# Patient Record
Sex: Female | Born: 1955
Health system: Southern US, Community
[De-identification: ages and names within clinical notes are randomized; demographics above are authoritative.]

## PROBLEM LIST (undated history)

## (undated) DIAGNOSIS — I10 Essential (primary) hypertension: Secondary | ICD-10-CM

## (undated) DIAGNOSIS — E7800 Pure hypercholesterolemia, unspecified: Secondary | ICD-10-CM

## (undated) DIAGNOSIS — F419 Anxiety disorder, unspecified: Secondary | ICD-10-CM

## (undated) DIAGNOSIS — K279 Peptic ulcer, site unspecified, unspecified as acute or chronic, without hemorrhage or perforation: Secondary | ICD-10-CM

## (undated) DIAGNOSIS — I2510 Atherosclerotic heart disease of native coronary artery without angina pectoris: Secondary | ICD-10-CM

## (undated) DIAGNOSIS — I219 Acute myocardial infarction, unspecified: Secondary | ICD-10-CM

## (undated) DIAGNOSIS — M1990 Unspecified osteoarthritis, unspecified site: Secondary | ICD-10-CM

## (undated) DIAGNOSIS — M329 Systemic lupus erythematosus, unspecified: Secondary | ICD-10-CM

## (undated) HISTORY — PX: ESOPHAGOGASTRODUODENOSCOPY: SHX1529

## (undated) HISTORY — PX: BREAST LUMPECTOMY: SHX2

## (undated) HISTORY — PX: TUBAL LIGATION: SHX77

## (undated) HISTORY — PX: ABDOMINAL HYSTERECTOMY: SHX81

## (undated) HISTORY — PX: CORONARY ANGIOPLASTY WITH STENT PLACEMENT: SHX49

---

## 2006-04-22 HISTORY — PX: COLONOSCOPY: SHX174

## 2010-04-22 DIAGNOSIS — I219 Acute myocardial infarction, unspecified: Secondary | ICD-10-CM

## 2010-04-22 HISTORY — DX: Acute myocardial infarction, unspecified: I21.9

## 2011-04-23 DIAGNOSIS — Z7982 Long term (current) use of aspirin: Secondary | ICD-10-CM | POA: Diagnosis not present

## 2011-04-23 DIAGNOSIS — Y92009 Unspecified place in unspecified non-institutional (private) residence as the place of occurrence of the external cause: Secondary | ICD-10-CM | POA: Diagnosis not present

## 2011-04-23 DIAGNOSIS — E785 Hyperlipidemia, unspecified: Secondary | ICD-10-CM | POA: Diagnosis present

## 2011-04-23 DIAGNOSIS — R079 Chest pain, unspecified: Secondary | ICD-10-CM | POA: Diagnosis not present

## 2011-04-23 DIAGNOSIS — IMO0002 Reserved for concepts with insufficient information to code with codable children: Secondary | ICD-10-CM | POA: Diagnosis not present

## 2011-04-23 DIAGNOSIS — Z01818 Encounter for other preprocedural examination: Secondary | ICD-10-CM | POA: Diagnosis not present

## 2011-04-23 DIAGNOSIS — S0990XA Unspecified injury of head, initial encounter: Secondary | ICD-10-CM | POA: Diagnosis not present

## 2011-04-23 DIAGNOSIS — I2510 Atherosclerotic heart disease of native coronary artery without angina pectoris: Secondary | ICD-10-CM | POA: Diagnosis not present

## 2011-04-23 DIAGNOSIS — S7000XA Contusion of unspecified hip, initial encounter: Secondary | ICD-10-CM | POA: Diagnosis not present

## 2011-04-23 DIAGNOSIS — S01502A Unspecified open wound of oral cavity, initial encounter: Secondary | ICD-10-CM | POA: Diagnosis not present

## 2011-04-23 DIAGNOSIS — I059 Rheumatic mitral valve disease, unspecified: Secondary | ICD-10-CM | POA: Diagnosis not present

## 2011-04-23 DIAGNOSIS — W010XXA Fall on same level from slipping, tripping and stumbling without subsequent striking against object, initial encounter: Secondary | ICD-10-CM | POA: Diagnosis not present

## 2011-04-23 DIAGNOSIS — S01501A Unspecified open wound of lip, initial encounter: Secondary | ICD-10-CM | POA: Diagnosis not present

## 2011-04-23 DIAGNOSIS — R072 Precordial pain: Secondary | ICD-10-CM | POA: Diagnosis not present

## 2011-04-23 DIAGNOSIS — Z9861 Coronary angioplasty status: Secondary | ICD-10-CM | POA: Diagnosis not present

## 2011-04-23 DIAGNOSIS — T148XXA Other injury of unspecified body region, initial encounter: Secondary | ICD-10-CM | POA: Diagnosis not present

## 2011-04-23 DIAGNOSIS — S79929A Unspecified injury of unspecified thigh, initial encounter: Secondary | ICD-10-CM | POA: Diagnosis not present

## 2011-04-23 DIAGNOSIS — I10 Essential (primary) hypertension: Secondary | ICD-10-CM | POA: Diagnosis present

## 2011-04-23 DIAGNOSIS — D62 Acute posthemorrhagic anemia: Secondary | ICD-10-CM | POA: Diagnosis not present

## 2011-04-23 DIAGNOSIS — M329 Systemic lupus erythematosus, unspecified: Secondary | ICD-10-CM | POA: Diagnosis present

## 2011-04-23 DIAGNOSIS — I200 Unstable angina: Secondary | ICD-10-CM | POA: Diagnosis present

## 2011-04-23 DIAGNOSIS — Z9181 History of falling: Secondary | ICD-10-CM | POA: Diagnosis not present

## 2011-04-23 DIAGNOSIS — S79919A Unspecified injury of unspecified hip, initial encounter: Secondary | ICD-10-CM | POA: Diagnosis not present

## 2011-05-08 DIAGNOSIS — I2510 Atherosclerotic heart disease of native coronary artery without angina pectoris: Secondary | ICD-10-CM | POA: Diagnosis not present

## 2011-05-08 DIAGNOSIS — E669 Obesity, unspecified: Secondary | ICD-10-CM | POA: Diagnosis not present

## 2011-05-08 DIAGNOSIS — Z9861 Coronary angioplasty status: Secondary | ICD-10-CM | POA: Diagnosis not present

## 2011-05-08 DIAGNOSIS — E7800 Pure hypercholesterolemia, unspecified: Secondary | ICD-10-CM | POA: Diagnosis not present

## 2011-05-27 DIAGNOSIS — R072 Precordial pain: Secondary | ICD-10-CM | POA: Diagnosis not present

## 2011-05-27 DIAGNOSIS — I2510 Atherosclerotic heart disease of native coronary artery without angina pectoris: Secondary | ICD-10-CM | POA: Diagnosis not present

## 2011-05-27 DIAGNOSIS — Z79899 Other long term (current) drug therapy: Secondary | ICD-10-CM | POA: Diagnosis not present

## 2011-05-27 DIAGNOSIS — Z9861 Coronary angioplasty status: Secondary | ICD-10-CM | POA: Diagnosis not present

## 2011-05-30 DIAGNOSIS — I2510 Atherosclerotic heart disease of native coronary artery without angina pectoris: Secondary | ICD-10-CM | POA: Diagnosis present

## 2011-05-30 DIAGNOSIS — R0789 Other chest pain: Secondary | ICD-10-CM | POA: Diagnosis not present

## 2011-05-30 DIAGNOSIS — I252 Old myocardial infarction: Secondary | ICD-10-CM | POA: Diagnosis not present

## 2011-05-30 DIAGNOSIS — Z9861 Coronary angioplasty status: Secondary | ICD-10-CM | POA: Diagnosis not present

## 2011-05-30 DIAGNOSIS — I200 Unstable angina: Secondary | ICD-10-CM | POA: Diagnosis present

## 2011-05-30 DIAGNOSIS — I10 Essential (primary) hypertension: Secondary | ICD-10-CM | POA: Diagnosis present

## 2011-05-30 DIAGNOSIS — D649 Anemia, unspecified: Secondary | ICD-10-CM | POA: Diagnosis present

## 2011-06-13 DIAGNOSIS — I2510 Atherosclerotic heart disease of native coronary artery without angina pectoris: Secondary | ICD-10-CM | POA: Diagnosis not present

## 2011-06-13 DIAGNOSIS — Z9861 Coronary angioplasty status: Secondary | ICD-10-CM | POA: Diagnosis not present

## 2011-06-13 DIAGNOSIS — E7800 Pure hypercholesterolemia, unspecified: Secondary | ICD-10-CM | POA: Diagnosis not present

## 2011-07-29 DIAGNOSIS — I2510 Atherosclerotic heart disease of native coronary artery without angina pectoris: Secondary | ICD-10-CM | POA: Diagnosis not present

## 2011-07-29 DIAGNOSIS — Z9861 Coronary angioplasty status: Secondary | ICD-10-CM | POA: Diagnosis not present

## 2011-07-29 DIAGNOSIS — E7800 Pure hypercholesterolemia, unspecified: Secondary | ICD-10-CM | POA: Diagnosis not present

## 2011-08-20 DIAGNOSIS — E785 Hyperlipidemia, unspecified: Secondary | ICD-10-CM | POA: Diagnosis not present

## 2011-08-20 DIAGNOSIS — I259 Chronic ischemic heart disease, unspecified: Secondary | ICD-10-CM | POA: Diagnosis not present

## 2011-08-20 DIAGNOSIS — K219 Gastro-esophageal reflux disease without esophagitis: Secondary | ICD-10-CM | POA: Diagnosis not present

## 2011-08-20 DIAGNOSIS — R109 Unspecified abdominal pain: Secondary | ICD-10-CM | POA: Diagnosis not present

## 2011-12-11 DIAGNOSIS — I2510 Atherosclerotic heart disease of native coronary artery without angina pectoris: Secondary | ICD-10-CM | POA: Diagnosis not present

## 2011-12-11 DIAGNOSIS — Z9861 Coronary angioplasty status: Secondary | ICD-10-CM | POA: Diagnosis not present

## 2011-12-11 DIAGNOSIS — E7800 Pure hypercholesterolemia, unspecified: Secondary | ICD-10-CM | POA: Diagnosis not present

## 2012-07-08 DIAGNOSIS — E7800 Pure hypercholesterolemia, unspecified: Secondary | ICD-10-CM | POA: Diagnosis not present

## 2012-07-23 DIAGNOSIS — Z9861 Coronary angioplasty status: Secondary | ICD-10-CM | POA: Diagnosis not present

## 2012-07-23 DIAGNOSIS — E7800 Pure hypercholesterolemia, unspecified: Secondary | ICD-10-CM | POA: Diagnosis not present

## 2012-07-23 DIAGNOSIS — I2510 Atherosclerotic heart disease of native coronary artery without angina pectoris: Secondary | ICD-10-CM | POA: Diagnosis not present

## 2013-02-15 DIAGNOSIS — I517 Cardiomegaly: Secondary | ICD-10-CM | POA: Diagnosis not present

## 2013-05-11 ENCOUNTER — Other Ambulatory Visit (HOSPITAL_COMMUNITY): Payer: Self-pay | Admitting: Family Medicine

## 2013-05-11 DIAGNOSIS — I519 Heart disease, unspecified: Secondary | ICD-10-CM | POA: Diagnosis not present

## 2013-05-11 DIAGNOSIS — I10 Essential (primary) hypertension: Secondary | ICD-10-CM | POA: Diagnosis not present

## 2013-05-11 DIAGNOSIS — Z139 Encounter for screening, unspecified: Secondary | ICD-10-CM

## 2013-05-11 DIAGNOSIS — R0602 Shortness of breath: Secondary | ICD-10-CM | POA: Diagnosis not present

## 2013-05-17 ENCOUNTER — Ambulatory Visit (HOSPITAL_COMMUNITY)
Admission: RE | Admit: 2013-05-17 | Discharge: 2013-05-17 | Disposition: A | Discharge status: Home / Self Care | Payer: Medicare Other | Source: Ambulatory Visit | Attending: Family Medicine | Admitting: Family Medicine

## 2013-05-17 DIAGNOSIS — Z1231 Encounter for screening mammogram for malignant neoplasm of breast: Secondary | ICD-10-CM | POA: Diagnosis not present

## 2013-05-17 DIAGNOSIS — Z139 Encounter for screening, unspecified: Secondary | ICD-10-CM

## 2013-05-18 DIAGNOSIS — Z1329 Encounter for screening for other suspected endocrine disorder: Secondary | ICD-10-CM | POA: Diagnosis not present

## 2013-05-18 DIAGNOSIS — Z131 Encounter for screening for diabetes mellitus: Secondary | ICD-10-CM | POA: Diagnosis not present

## 2013-05-18 DIAGNOSIS — Z136 Encounter for screening for cardiovascular disorders: Secondary | ICD-10-CM | POA: Diagnosis not present

## 2013-05-18 DIAGNOSIS — E559 Vitamin D deficiency, unspecified: Secondary | ICD-10-CM | POA: Diagnosis not present

## 2013-06-01 DIAGNOSIS — E7800 Pure hypercholesterolemia, unspecified: Secondary | ICD-10-CM | POA: Diagnosis not present

## 2013-08-16 DIAGNOSIS — I10 Essential (primary) hypertension: Secondary | ICD-10-CM | POA: Diagnosis not present

## 2013-09-10 DIAGNOSIS — I2510 Atherosclerotic heart disease of native coronary artery without angina pectoris: Secondary | ICD-10-CM | POA: Diagnosis not present

## 2013-09-10 DIAGNOSIS — Z79899 Other long term (current) drug therapy: Secondary | ICD-10-CM | POA: Diagnosis not present

## 2013-09-10 DIAGNOSIS — Z9861 Coronary angioplasty status: Secondary | ICD-10-CM | POA: Diagnosis not present

## 2013-09-10 DIAGNOSIS — E7800 Pure hypercholesterolemia, unspecified: Secondary | ICD-10-CM | POA: Diagnosis not present

## 2013-10-21 DIAGNOSIS — L659 Nonscarring hair loss, unspecified: Secondary | ICD-10-CM | POA: Diagnosis not present

## 2013-11-04 DIAGNOSIS — Z4802 Encounter for removal of sutures: Secondary | ICD-10-CM | POA: Diagnosis not present

## 2013-11-04 DIAGNOSIS — L658 Other specified nonscarring hair loss: Secondary | ICD-10-CM | POA: Diagnosis not present

## 2013-11-15 DIAGNOSIS — E7800 Pure hypercholesterolemia, unspecified: Secondary | ICD-10-CM | POA: Diagnosis not present

## 2013-11-15 DIAGNOSIS — I10 Essential (primary) hypertension: Secondary | ICD-10-CM | POA: Diagnosis not present

## 2013-11-15 DIAGNOSIS — I519 Heart disease, unspecified: Secondary | ICD-10-CM | POA: Diagnosis not present

## 2013-12-08 DIAGNOSIS — L089 Local infection of the skin and subcutaneous tissue, unspecified: Secondary | ICD-10-CM | POA: Diagnosis not present

## 2013-12-08 DIAGNOSIS — L439 Lichen planus, unspecified: Secondary | ICD-10-CM | POA: Diagnosis not present

## 2014-02-07 DIAGNOSIS — L089 Local infection of the skin and subcutaneous tissue, unspecified: Secondary | ICD-10-CM | POA: Diagnosis not present

## 2014-02-07 DIAGNOSIS — L669 Cicatricial alopecia, unspecified: Secondary | ICD-10-CM | POA: Diagnosis not present

## 2014-03-20 ENCOUNTER — Observation Stay (HOSPITAL_COMMUNITY)
Admission: EM | Admit: 2014-03-20 | Discharge: 2014-03-21 | Disposition: A | Discharge status: Home / Self Care | Payer: Medicare Other | Attending: Family Medicine | Admitting: Family Medicine

## 2014-03-20 ENCOUNTER — Encounter (HOSPITAL_COMMUNITY): Payer: Self-pay | Admitting: *Deleted

## 2014-03-20 ENCOUNTER — Emergency Department (HOSPITAL_COMMUNITY): Payer: Medicare Other

## 2014-03-20 DIAGNOSIS — Z9861 Coronary angioplasty status: Secondary | ICD-10-CM | POA: Diagnosis not present

## 2014-03-20 DIAGNOSIS — E780 Pure hypercholesterolemia: Secondary | ICD-10-CM | POA: Insufficient documentation

## 2014-03-20 DIAGNOSIS — I2510 Atherosclerotic heart disease of native coronary artery without angina pectoris: Secondary | ICD-10-CM | POA: Diagnosis not present

## 2014-03-20 DIAGNOSIS — M329 Systemic lupus erythematosus, unspecified: Secondary | ICD-10-CM | POA: Diagnosis not present

## 2014-03-20 DIAGNOSIS — R002 Palpitations: Secondary | ICD-10-CM | POA: Diagnosis not present

## 2014-03-20 DIAGNOSIS — R55 Syncope and collapse: Secondary | ICD-10-CM | POA: Insufficient documentation

## 2014-03-20 DIAGNOSIS — R0789 Other chest pain: Principal | ICD-10-CM | POA: Insufficient documentation

## 2014-03-20 DIAGNOSIS — M1990 Unspecified osteoarthritis, unspecified site: Secondary | ICD-10-CM | POA: Insufficient documentation

## 2014-03-20 DIAGNOSIS — R51 Headache: Secondary | ICD-10-CM | POA: Insufficient documentation

## 2014-03-20 DIAGNOSIS — R079 Chest pain, unspecified: Secondary | ICD-10-CM | POA: Diagnosis present

## 2014-03-20 DIAGNOSIS — K279 Peptic ulcer, site unspecified, unspecified as acute or chronic, without hemorrhage or perforation: Secondary | ICD-10-CM | POA: Diagnosis not present

## 2014-03-20 DIAGNOSIS — I10 Essential (primary) hypertension: Secondary | ICD-10-CM | POA: Diagnosis not present

## 2014-03-20 DIAGNOSIS — I252 Old myocardial infarction: Secondary | ICD-10-CM | POA: Insufficient documentation

## 2014-03-20 DIAGNOSIS — F419 Anxiety disorder, unspecified: Secondary | ICD-10-CM | POA: Insufficient documentation

## 2014-03-20 DIAGNOSIS — R519 Headache, unspecified: Secondary | ICD-10-CM

## 2014-03-20 DIAGNOSIS — IMO0002 Reserved for concepts with insufficient information to code with codable children: Secondary | ICD-10-CM

## 2014-03-20 HISTORY — DX: Pure hypercholesterolemia, unspecified: E78.00

## 2014-03-20 HISTORY — DX: Peptic ulcer, site unspecified, unspecified as acute or chronic, without hemorrhage or perforation: K27.9

## 2014-03-20 HISTORY — DX: Anxiety disorder, unspecified: F41.9

## 2014-03-20 HISTORY — DX: Acute myocardial infarction, unspecified: I21.9

## 2014-03-20 HISTORY — DX: Atherosclerotic heart disease of native coronary artery without angina pectoris: I25.10

## 2014-03-20 HISTORY — DX: Essential (primary) hypertension: I10

## 2014-03-20 HISTORY — DX: Systemic lupus erythematosus, unspecified: M32.9

## 2014-03-20 HISTORY — DX: Unspecified osteoarthritis, unspecified site: M19.90

## 2014-03-20 LAB — BASIC METABOLIC PANEL
ANION GAP: 20 — AB (ref 5–15)
BUN: 18 mg/dL (ref 6–23)
CHLORIDE: 101 meq/L (ref 96–112)
CO2: 21 mEq/L (ref 19–32)
Calcium: 10.3 mg/dL (ref 8.4–10.5)
Creatinine, Ser: 0.96 mg/dL (ref 0.50–1.10)
GFR calc Af Amer: 73 mL/min — ABNORMAL LOW (ref >90)
GFR calc non Af Amer: 63 mL/min — ABNORMAL LOW (ref >90)
Glucose, Bld: 110 mg/dL — ABNORMAL HIGH (ref 70–99)
POTASSIUM: 3.1 meq/L — AB (ref 3.7–5.3)
Sodium: 142 mEq/L (ref 137–147)

## 2014-03-20 LAB — CBC WITH DIFFERENTIAL/PLATELET
BAND NEUTROPHILS: 0 % (ref 0–10)
BASOS PCT: 0 % (ref 0–1)
BLASTS: 0 %
Basophils Absolute: 0 10*3/uL (ref 0.0–0.1)
Eosinophils Absolute: 0.2 10*3/uL (ref 0.0–0.7)
Eosinophils Relative: 2 % (ref 0–5)
HCT: 42.2 % (ref 36.0–46.0)
HEMOGLOBIN: 13.9 g/dL (ref 12.0–15.0)
Lymphocytes Relative: 48 % — ABNORMAL HIGH (ref 12–46)
Lymphs Abs: 4.6 10*3/uL — ABNORMAL HIGH (ref 0.7–4.0)
MCH: 30.3 pg (ref 26.0–34.0)
MCHC: 32.9 g/dL (ref 30.0–36.0)
MCV: 92.1 fL (ref 78.0–100.0)
MYELOCYTES: 0 %
Metamyelocytes Relative: 0 %
Monocytes Absolute: 0.6 10*3/uL (ref 0.1–1.0)
Monocytes Relative: 6 % (ref 3–12)
NEUTROS PCT: 44 % (ref 43–77)
Neutro Abs: 4.3 10*3/uL (ref 1.7–7.7)
PROMYELOCYTES ABS: 0 %
Platelets: 397 10*3/uL (ref 150–400)
RBC: 4.58 MIL/uL (ref 3.87–5.11)
RDW: 12.5 % (ref 11.5–15.5)
WBC: 9.7 10*3/uL (ref 4.0–10.5)
nRBC: 0 /100 WBC

## 2014-03-20 LAB — TROPONIN I

## 2014-03-20 LAB — I-STAT TROPONIN, ED: TROPONIN I, POC: 0.02 ng/mL (ref 0.00–0.08)

## 2014-03-20 LAB — D-DIMER, QUANTITATIVE: D-Dimer, Quant: 0.38 ug/mL-FEU (ref 0.00–0.48)

## 2014-03-20 MED ORDER — MORPHINE SULFATE 4 MG/ML IJ SOLN
4.0000 mg | Freq: Once | INTRAMUSCULAR | Status: AC
  Administered 2014-03-20: 4 mg via INTRAVENOUS
  Filled 2014-03-20: qty 1

## 2014-03-20 MED ORDER — SODIUM CHLORIDE 0.9 % IV SOLN: INTRAVENOUS | Status: DC

## 2014-03-20 MED ORDER — SODIUM CHLORIDE 0.9 % IV SOLN
INTRAVENOUS | Status: DC
  Administered 2014-03-21: 02:00:00 via INTRAVENOUS

## 2014-03-20 MED ORDER — NITROGLYCERIN 0.4 MG SL SUBL
0.4000 mg | SUBLINGUAL_TABLET | SUBLINGUAL | Status: DC | PRN
  Administered 2014-03-20 – 2014-03-21 (×2): 0.4 mg via SUBLINGUAL
  Filled 2014-03-20: qty 1

## 2014-03-20 MED ORDER — ONDANSETRON HCL 4 MG/2ML IJ SOLN: 4.0000 mg | Freq: Once | INTRAMUSCULAR | Status: DC

## 2014-03-20 NOTE — ED Notes (Signed)
Pt assisted to bathroom and back to bed 
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
## 2014-03-20 NOTE — ED Notes (Signed)
Pt gone over for CT 
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
## 2014-03-21 ENCOUNTER — Observation Stay (HOSPITAL_COMMUNITY): Payer: Medicare Other

## 2014-03-21 ENCOUNTER — Encounter (HOSPITAL_COMMUNITY): Payer: Self-pay | Admitting: Internal Medicine

## 2014-03-21 ENCOUNTER — Encounter (HOSPITAL_COMMUNITY): Payer: Self-pay

## 2014-03-21 DIAGNOSIS — Z862 Personal history of diseases of the blood and blood-forming organs and certain disorders involving the immune mechanism: Secondary | ICD-10-CM | POA: Diagnosis not present

## 2014-03-21 DIAGNOSIS — IMO0002 Reserved for concepts with insufficient information to code with codable children: Secondary | ICD-10-CM

## 2014-03-21 DIAGNOSIS — R55 Syncope and collapse: Secondary | ICD-10-CM | POA: Insufficient documentation

## 2014-03-21 DIAGNOSIS — R079 Chest pain, unspecified: Secondary | ICD-10-CM | POA: Diagnosis not present

## 2014-03-21 DIAGNOSIS — M329 Systemic lupus erythematosus, unspecified: Secondary | ICD-10-CM

## 2014-03-21 DIAGNOSIS — I10 Essential (primary) hypertension: Secondary | ICD-10-CM | POA: Diagnosis not present

## 2014-03-21 DIAGNOSIS — R072 Precordial pain: Secondary | ICD-10-CM

## 2014-03-21 LAB — GLUCOSE, CAPILLARY
Glucose-Capillary: 102 mg/dL — ABNORMAL HIGH (ref 70–99)
Glucose-Capillary: 111 mg/dL — ABNORMAL HIGH (ref 70–99)

## 2014-03-21 LAB — MAGNESIUM: MAGNESIUM: 2 mg/dL (ref 1.5–2.5)

## 2014-03-21 LAB — LIPID PANEL
CHOL/HDL RATIO: 3.7 ratio
Cholesterol: 227 mg/dL — ABNORMAL HIGH (ref 0–200)
HDL: 62 mg/dL (ref >39)
LDL CALC: 145 mg/dL — AB (ref 0–99)
Triglycerides: 98 mg/dL (ref <150)
VLDL: 20 mg/dL (ref 0–40)

## 2014-03-21 LAB — URINALYSIS, ROUTINE W REFLEX MICROSCOPIC
Bilirubin Urine: NEGATIVE
Glucose, UA: NEGATIVE mg/dL
KETONES UR: NEGATIVE mg/dL
Leukocytes, UA: NEGATIVE
Nitrite: NEGATIVE
PROTEIN: NEGATIVE mg/dL
Specific Gravity, Urine: 1.01 (ref 1.005–1.030)
UROBILINOGEN UA: 0.2 mg/dL (ref 0.0–1.0)
pH: 6 (ref 5.0–8.0)

## 2014-03-21 LAB — CBC WITH DIFFERENTIAL/PLATELET
BASOS ABS: 0 10*3/uL (ref 0.0–0.1)
Basophils Relative: 1 % (ref 0–1)
EOS ABS: 0.1 10*3/uL (ref 0.0–0.7)
EOS PCT: 1 % (ref 0–5)
HCT: 38 % (ref 36.0–46.0)
Hemoglobin: 12.4 g/dL (ref 12.0–15.0)
LYMPHS PCT: 49 % — AB (ref 12–46)
Lymphs Abs: 2.8 10*3/uL (ref 0.7–4.0)
MCH: 30.5 pg (ref 26.0–34.0)
MCHC: 32.6 g/dL (ref 30.0–36.0)
MCV: 93.4 fL (ref 78.0–100.0)
Monocytes Absolute: 0.5 10*3/uL (ref 0.1–1.0)
Monocytes Relative: 10 % (ref 3–12)
Neutro Abs: 2.1 10*3/uL (ref 1.7–7.7)
Neutrophils Relative %: 39 % — ABNORMAL LOW (ref 43–77)
Platelets: 319 10*3/uL (ref 150–400)
RBC: 4.07 MIL/uL (ref 3.87–5.11)
RDW: 12.5 % (ref 11.5–15.5)
WBC: 5.5 10*3/uL (ref 4.0–10.5)

## 2014-03-21 LAB — COMPREHENSIVE METABOLIC PANEL
ALT: 13 U/L (ref 0–35)
AST: 16 U/L (ref 0–37)
Albumin: 3.7 g/dL (ref 3.5–5.2)
Alkaline Phosphatase: 99 U/L (ref 39–117)
Anion gap: 15 (ref 5–15)
BUN: 12 mg/dL (ref 6–23)
CALCIUM: 9.2 mg/dL (ref 8.4–10.5)
CO2: 23 meq/L (ref 19–32)
CREATININE: 0.66 mg/dL (ref 0.50–1.10)
Chloride: 106 mEq/L (ref 96–112)
GFR calc Af Amer: >90 mL/min (ref >90)
GLUCOSE: 100 mg/dL — AB (ref 70–99)
Potassium: 3.7 mEq/L (ref 3.7–5.3)
Sodium: 144 mEq/L (ref 137–147)
Total Bilirubin: 0.6 mg/dL (ref 0.3–1.2)
Total Protein: 6.8 g/dL (ref 6.0–8.3)

## 2014-03-21 LAB — TROPONIN I
Troponin I: <0.3 ng/mL (ref <0.30)
Troponin I: <0.3 ng/mL (ref <0.30)
Troponin I: <0.3 ng/mL (ref <0.30)

## 2014-03-21 LAB — CREATININE, SERUM
CREATININE: 0.68 mg/dL (ref 0.50–1.10)
GFR calc Af Amer: >90 mL/min (ref >90)
GFR calc non Af Amer: >90 mL/min (ref >90)

## 2014-03-21 LAB — CBC

## 2014-03-21 LAB — URINE MICROSCOPIC-ADD ON

## 2014-03-21 MED ORDER — SODIUM CHLORIDE 0.9 % IJ SOLN
INTRAMUSCULAR | Status: AC
  Administered 2014-03-21: 10 mL via INTRAVENOUS
  Filled 2014-03-21: qty 10

## 2014-03-21 MED ORDER — REGADENOSON 0.4 MG/5ML IV SOLN
INTRAVENOUS | Status: AC
  Administered 2014-03-21: 0.4 mg via INTRAVENOUS
  Filled 2014-03-21: qty 5

## 2014-03-21 MED ORDER — METOPROLOL TARTRATE 25 MG PO TABS
25.0000 mg | ORAL_TABLET | Freq: Two times a day (BID) | ORAL | Status: DC
  Administered 2014-03-21 (×2): 25 mg via ORAL
  Filled 2014-03-21 (×2): qty 1

## 2014-03-21 MED ORDER — SODIUM CHLORIDE 0.9 % IJ SOLN
3.0000 mL | Freq: Two times a day (BID) | INTRAMUSCULAR | Status: DC
  Administered 2014-03-21: 3 mL via INTRAVENOUS

## 2014-03-21 MED ORDER — REGADENOSON 0.4 MG/5ML IV SOLN
0.4000 mg | Freq: Once | INTRAVENOUS | Status: AC | PRN
Start: 2014-03-21 — End: 2014-03-21
  Administered 2014-03-21: 0.4 mg via INTRAVENOUS
  Filled 2014-03-21: qty 5

## 2014-03-21 MED ORDER — MORPHINE SULFATE 2 MG/ML IJ SOLN: 1.0000 mg | INTRAMUSCULAR | Status: DC | PRN

## 2014-03-21 MED ORDER — TECHNETIUM TC 99M SESTAMIBI - CARDIOLITE
30.0000 | Freq: Once | INTRAVENOUS | Status: AC | PRN
  Administered 2014-03-21: 30 via INTRAVENOUS

## 2014-03-21 MED ORDER — ONDANSETRON HCL 4 MG PO TABS: 4.0000 mg | ORAL_TABLET | Freq: Four times a day (QID) | ORAL | Status: DC | PRN

## 2014-03-21 MED ORDER — ESCITALOPRAM OXALATE 10 MG PO TABS
10.0000 mg | ORAL_TABLET | Freq: Every day | ORAL | Status: DC
  Administered 2014-03-21: 10 mg via ORAL
  Filled 2014-03-21 (×2): qty 1

## 2014-03-21 MED ORDER — POTASSIUM CHLORIDE CRYS ER 20 MEQ PO TBCR
40.0000 meq | EXTENDED_RELEASE_TABLET | ORAL | Status: AC
  Administered 2014-03-21: 40 meq via ORAL
  Filled 2014-03-21: qty 2

## 2014-03-21 MED ORDER — ENOXAPARIN SODIUM 40 MG/0.4ML ~~LOC~~ SOLN
40.0000 mg | SUBCUTANEOUS | Status: DC
  Administered 2014-03-21: 40 mg via SUBCUTANEOUS
  Filled 2014-03-21: qty 0.4

## 2014-03-21 MED ORDER — HYDRALAZINE HCL 20 MG/ML IJ SOLN: 10.0000 mg | INTRAMUSCULAR | Status: DC | PRN

## 2014-03-21 MED ORDER — ONDANSETRON HCL 4 MG/2ML IJ SOLN: 4.0000 mg | Freq: Four times a day (QID) | INTRAMUSCULAR | Status: DC | PRN

## 2014-03-21 MED ORDER — ACETAMINOPHEN 325 MG PO TABS: 650.0000 mg | ORAL_TABLET | Freq: Four times a day (QID) | ORAL | Status: DC | PRN

## 2014-03-21 MED ORDER — SODIUM CHLORIDE 0.9 % IJ SOLN
10.0000 mL | INTRAMUSCULAR | Status: DC | PRN
  Administered 2014-03-21: 10 mL via INTRAVENOUS
  Filled 2014-03-21: qty 10

## 2014-03-21 MED ORDER — ACETAMINOPHEN 650 MG RE SUPP: 650.0000 mg | Freq: Four times a day (QID) | RECTAL | Status: DC | PRN

## 2014-03-21 MED ORDER — SODIUM CHLORIDE 0.9 % IV SOLN: INTRAVENOUS | Status: DC

## 2014-03-21 MED ORDER — MORPHINE SULFATE 4 MG/ML IJ SOLN
4.0000 mg | Freq: Once | INTRAMUSCULAR | Status: AC
  Administered 2014-03-21: 4 mg via INTRAVENOUS
  Filled 2014-03-21: qty 1

## 2014-03-21 MED ORDER — LISINOPRIL 10 MG PO TABS
10.0000 mg | ORAL_TABLET | Freq: Every day | ORAL | Status: DC
  Administered 2014-03-21 (×2): 10 mg via ORAL
  Filled 2014-03-21 (×2): qty 1

## 2014-03-21 MED ORDER — TECHNETIUM TC 99M SESTAMIBI GENERIC - CARDIOLITE
10.0000 | Freq: Once | INTRAVENOUS | Status: AC | PRN
  Administered 2014-03-21: 10 via INTRAVENOUS

## 2014-03-21 MED ORDER — ASPIRIN EC 325 MG PO TBEC
325.0000 mg | DELAYED_RELEASE_TABLET | Freq: Every day | ORAL | Status: DC
  Administered 2014-03-21: 325 mg via ORAL
  Filled 2014-03-21: qty 1

## 2014-03-21 NOTE — Progress Notes (Signed)
Patient states understanding of discharge instructions.  

## 2014-03-21 NOTE — Progress Notes (Signed)
UR completed 
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
## 2014-03-21 NOTE — Progress Notes (Signed)
Nuclear medicine cardiac stress test is normal per Dr Diona BrownerMcDowell. See report for more details. . Patient is advised to follow up with primary cardiologist. No further recommendations.

## 2014-03-21 NOTE — ED Notes (Signed)
Report given to Jessica RN

## 2014-03-21 NOTE — Progress Notes (Signed)
  Echocardiogram 2D Echocardiogram has been performed.  Leta JunglingCooper, Eddi Hymes M 03/21/2014, 1:43 PM
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
## 2014-03-21 NOTE — Progress Notes (Signed)
Stress Lab Nurses Notes - Sheila [name]  Sheila [name] Sheila [name] 03/21/2014 Reason for doing test: CAD and Chest Pain Type of test: Marlane HatcherLexiscan Cardiolite / Inpatient Rm 324 Nurse performing test: Sheila PoissonPhyllis Billingsly, RN Nuclear Medicine Tech: Sheila [name] Echo Tech: Not Applicable MD performing test: S. McDowell/K.Lyman BishopLawrence NP Family MD: Sheila [name] Test explained and consent signed: Yes.   IV started: Saline lock flushed, No redness or edema and Saline lock from floor Symptoms: nausea Treatment/Intervention: None Reason test stopped: protocol completed After recovery IV was: No redness or edema and Saline Lock flushed Patient to return to Nuc. Med at : 11:45 Patient discharged: Transported back to room 324 via wc Patient's Condition upon discharge was: stable Comments: During test BP 167/99 & HR 84. Recovery BP 148/95 & HR 69.  Symptoms resolved in recovery. Sheila [name], Sheila [name]
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
## 2014-03-22 ENCOUNTER — Encounter (HOSPITAL_COMMUNITY): Payer: Medicare Other

## 2014-03-22 ENCOUNTER — Ambulatory Visit (HOSPITAL_COMMUNITY): Payer: Medicare Other

## 2014-03-22 LAB — GLUCOSE, CAPILLARY: Glucose-Capillary: 126 mg/dL — ABNORMAL HIGH (ref 70–99)

## 2014-05-25 ENCOUNTER — Other Ambulatory Visit (HOSPITAL_COMMUNITY): Payer: Self-pay | Admitting: Family Medicine

## 2014-05-25 DIAGNOSIS — Z1231 Encounter for screening mammogram for malignant neoplasm of breast: Secondary | ICD-10-CM

## 2014-06-08 ENCOUNTER — Ambulatory Visit (HOSPITAL_COMMUNITY)
Admission: RE | Admit: 2014-06-08 | Discharge: 2014-06-08 | Disposition: A | Discharge status: Home / Self Care | Payer: Medicare Other | Source: Ambulatory Visit | Attending: Family Medicine | Admitting: Family Medicine

## 2014-06-08 DIAGNOSIS — Z1231 Encounter for screening mammogram for malignant neoplasm of breast: Secondary | ICD-10-CM | POA: Diagnosis not present

## 2014-06-18 DIAGNOSIS — I519 Heart disease, unspecified: Secondary | ICD-10-CM | POA: Diagnosis not present

## 2014-06-18 DIAGNOSIS — E782 Mixed hyperlipidemia: Secondary | ICD-10-CM | POA: Diagnosis not present

## 2014-06-18 DIAGNOSIS — E780 Pure hypercholesterolemia: Secondary | ICD-10-CM | POA: Diagnosis not present

## 2014-06-18 DIAGNOSIS — F410 Panic disorder [episodic paroxysmal anxiety] without agoraphobia: Secondary | ICD-10-CM | POA: Diagnosis not present

## 2014-06-18 DIAGNOSIS — I10 Essential (primary) hypertension: Secondary | ICD-10-CM | POA: Diagnosis not present

## 2014-06-18 DIAGNOSIS — R739 Hyperglycemia, unspecified: Secondary | ICD-10-CM | POA: Diagnosis not present

## 2014-06-29 DIAGNOSIS — R1013 Epigastric pain: Secondary | ICD-10-CM | POA: Diagnosis not present

## 2014-06-29 DIAGNOSIS — K219 Gastro-esophageal reflux disease without esophagitis: Secondary | ICD-10-CM | POA: Diagnosis not present

## 2014-06-29 DIAGNOSIS — R630 Anorexia: Secondary | ICD-10-CM | POA: Diagnosis not present

## 2014-07-05 DIAGNOSIS — K315 Obstruction of duodenum: Secondary | ICD-10-CM | POA: Diagnosis not present

## 2014-07-05 DIAGNOSIS — K2970 Gastritis, unspecified, without bleeding: Secondary | ICD-10-CM | POA: Diagnosis not present

## 2014-07-05 DIAGNOSIS — R1013 Epigastric pain: Secondary | ICD-10-CM | POA: Diagnosis not present

## 2014-07-05 DIAGNOSIS — K319 Disease of stomach and duodenum, unspecified: Secondary | ICD-10-CM | POA: Diagnosis not present

## 2014-07-05 DIAGNOSIS — K259 Gastric ulcer, unspecified as acute or chronic, without hemorrhage or perforation: Secondary | ICD-10-CM | POA: Diagnosis not present

## 2014-07-05 DIAGNOSIS — K2950 Unspecified chronic gastritis without bleeding: Secondary | ICD-10-CM | POA: Diagnosis not present

## 2014-07-05 DIAGNOSIS — K449 Diaphragmatic hernia without obstruction or gangrene: Secondary | ICD-10-CM | POA: Diagnosis not present

## 2014-07-18 DIAGNOSIS — I519 Heart disease, unspecified: Secondary | ICD-10-CM | POA: Diagnosis not present

## 2014-07-18 DIAGNOSIS — E782 Mixed hyperlipidemia: Secondary | ICD-10-CM | POA: Diagnosis not present

## 2014-07-18 DIAGNOSIS — I10 Essential (primary) hypertension: Secondary | ICD-10-CM | POA: Diagnosis not present

## 2014-07-18 DIAGNOSIS — F410 Panic disorder [episodic paroxysmal anxiety] without agoraphobia: Secondary | ICD-10-CM | POA: Diagnosis not present

## 2014-12-16 DIAGNOSIS — E780 Pure hypercholesterolemia: Secondary | ICD-10-CM | POA: Diagnosis not present

## 2014-12-16 DIAGNOSIS — Z955 Presence of coronary angioplasty implant and graft: Secondary | ICD-10-CM | POA: Diagnosis not present

## 2014-12-16 DIAGNOSIS — I2510 Atherosclerotic heart disease of native coronary artery without angina pectoris: Secondary | ICD-10-CM | POA: Diagnosis not present

## 2015-01-22 ENCOUNTER — Encounter (HOSPITAL_COMMUNITY): Payer: Self-pay | Admitting: Emergency Medicine

## 2015-01-22 ENCOUNTER — Emergency Department (HOSPITAL_COMMUNITY)
Admission: EM | Admit: 2015-01-22 | Discharge: 2015-01-22 | Disposition: A | Discharge status: Home / Self Care | Payer: Medicare Other | Source: Home / Self Care | Attending: Emergency Medicine | Admitting: Emergency Medicine

## 2015-01-22 DIAGNOSIS — R1011 Right upper quadrant pain: Secondary | ICD-10-CM | POA: Diagnosis not present

## 2015-01-22 DIAGNOSIS — M329 Systemic lupus erythematosus, unspecified: Secondary | ICD-10-CM | POA: Diagnosis not present

## 2015-01-22 DIAGNOSIS — K750 Abscess of liver: Secondary | ICD-10-CM | POA: Diagnosis not present

## 2015-01-22 DIAGNOSIS — N390 Urinary tract infection, site not specified: Secondary | ICD-10-CM

## 2015-01-22 DIAGNOSIS — N179 Acute kidney failure, unspecified: Secondary | ICD-10-CM | POA: Diagnosis not present

## 2015-01-22 DIAGNOSIS — R197 Diarrhea, unspecified: Secondary | ICD-10-CM | POA: Diagnosis not present

## 2015-01-22 DIAGNOSIS — I96 Gangrene, not elsewhere classified: Secondary | ICD-10-CM | POA: Diagnosis not present

## 2015-01-22 DIAGNOSIS — R112 Nausea with vomiting, unspecified: Secondary | ICD-10-CM | POA: Diagnosis not present

## 2015-01-22 DIAGNOSIS — K819 Cholecystitis, unspecified: Secondary | ICD-10-CM | POA: Diagnosis not present

## 2015-01-22 DIAGNOSIS — R1084 Generalized abdominal pain: Secondary | ICD-10-CM | POA: Diagnosis not present

## 2015-01-22 DIAGNOSIS — E860 Dehydration: Secondary | ICD-10-CM | POA: Diagnosis not present

## 2015-01-22 DIAGNOSIS — R1010 Upper abdominal pain, unspecified: Secondary | ICD-10-CM | POA: Diagnosis not present

## 2015-01-22 DIAGNOSIS — K8020 Calculus of gallbladder without cholecystitis without obstruction: Secondary | ICD-10-CM | POA: Diagnosis not present

## 2015-01-22 DIAGNOSIS — K8000 Calculus of gallbladder with acute cholecystitis without obstruction: Secondary | ICD-10-CM | POA: Diagnosis not present

## 2015-01-22 DIAGNOSIS — R509 Fever, unspecified: Secondary | ICD-10-CM | POA: Diagnosis not present

## 2015-01-22 DIAGNOSIS — R109 Unspecified abdominal pain: Secondary | ICD-10-CM | POA: Diagnosis not present

## 2015-01-22 DIAGNOSIS — R1013 Epigastric pain: Secondary | ICD-10-CM | POA: Diagnosis not present

## 2015-01-22 LAB — CBC
HCT: 39 % (ref 36.0–46.0)
Hemoglobin: 12.3 g/dL (ref 12.0–15.0)
MCH: 30.1 pg (ref 26.0–34.0)
MCHC: 31.5 g/dL (ref 30.0–36.0)
MCV: 95.6 fL (ref 78.0–100.0)
PLATELETS: 338 10*3/uL (ref 150–400)
RBC: 4.08 MIL/uL (ref 3.87–5.11)
RDW: 12.9 % (ref 11.5–15.5)
WBC: 9.1 10*3/uL (ref 4.0–10.5)

## 2015-01-22 LAB — COMPREHENSIVE METABOLIC PANEL
ALK PHOS: 121 U/L (ref 38–126)
ALT: 18 U/L (ref 14–54)
AST: 24 U/L (ref 15–41)
Albumin: 4.2 g/dL (ref 3.5–5.0)
Anion gap: 8 (ref 5–15)
BUN: 14 mg/dL (ref 6–20)
CO2: 28 mmol/L (ref 22–32)
CREATININE: 0.8 mg/dL (ref 0.44–1.00)
Calcium: 9 mg/dL (ref 8.9–10.3)
Chloride: 105 mmol/L (ref 101–111)
GFR calc non Af Amer: >60 mL/min (ref >60)
GLUCOSE: 115 mg/dL — AB (ref 65–99)
Potassium: 4.1 mmol/L (ref 3.5–5.1)
SODIUM: 141 mmol/L (ref 135–145)
Total Bilirubin: 0.8 mg/dL (ref 0.3–1.2)
Total Protein: 7.6 g/dL (ref 6.5–8.1)

## 2015-01-22 LAB — URINALYSIS, ROUTINE W REFLEX MICROSCOPIC
Bilirubin Urine: NEGATIVE
GLUCOSE, UA: NEGATIVE mg/dL
KETONES UR: NEGATIVE mg/dL
Nitrite: NEGATIVE
Protein, ur: NEGATIVE mg/dL
Specific Gravity, Urine: <1.005 — ABNORMAL LOW (ref 1.005–1.030)
Urobilinogen, UA: 0.2 mg/dL (ref 0.0–1.0)
pH: 6 (ref 5.0–8.0)

## 2015-01-22 LAB — URINE MICROSCOPIC-ADD ON

## 2015-01-22 LAB — LIPASE, BLOOD: Lipase: 28 U/L (ref 22–51)

## 2015-01-22 MED ORDER — OXYCODONE-ACETAMINOPHEN 5-325 MG PO TABS: 1.0000 | ORAL_TABLET | ORAL | Status: DC | PRN

## 2015-01-22 MED ORDER — ONDANSETRON HCL 8 MG PO TABS: 8.0000 mg | ORAL_TABLET | ORAL | Status: DC | PRN

## 2015-01-22 MED ORDER — CEPHALEXIN 500 MG PO CAPS: 500.0000 mg | ORAL_CAPSULE | Freq: Four times a day (QID) | ORAL | Status: DC

## 2015-01-22 NOTE — ED Notes (Addendum)
Pt c/o sharp upper abdominal pain and nausea that started Friday. Pt reports she has been unable to eat much over the past several days because when she does eat the abdominal pain gets worse. Pt denies any urinary symptoms. Pt does report 1 episode of diarrhea on Friday and she took some Milk of Magnesia afterwards to see if it would help her abdominal pain, but she had no relief. Denies fever and chills. Pt reports she had an EGD done in March 2016 in [place] and was told she had a gastric ulcer and severe reflux. Reports she was put on Carafate to help heal the ulcer but she doesn't feel like it has been helping.

## 2015-01-22 NOTE — ED Provider Notes (Signed)
CSN: [id number]     Arrival date & time 01/22/15  1153 History  By signing my name below, I, Sheila [name], attest that this documentation has been prepared under the direction and in the presence of Donnetta Hutching, MD. Electronically Signed: Murriel [name], ED Scribe. 01/22/2015. 12:40 PM.    Chief Complaint  Patient presents with  . Abdominal Pain     The history is provided by the patient. No language interpreter was used.   HPI Comments: Sheila [name] is a 59 y.o. female who presents to the Emergency Department complaining of intermittent central upper abdominal pain that radiates to the RUQ with associated nausea that has been present for two days. Pt states she has had acid reflux for over 25 years and reports her symptoms could be related to her reflux problems. Pt states that she received an endoscopy in March and was told that she had ulcers in her stomach. Pt reports her appetite has been poor, and states she is only able to eat soup. Pt reports she has had similar symptoms before but states they were worse this week. Pt reports one episode of diarrhea two days ago, and took some Milk of Magnesia afterwards to help with her abdominal pain, but had no relief. Pt denies fever and chills.   Past Medical History  Diagnosis Date  . Myocardial infarction (HCC) 2012   . CVD (cardiovascular disease)     Stent to mid RCA 2012 [name] 2.5 X 12 mm  . Systemic lupus (HCC)   . Anxiety   . Hypertension   . Arthritis   . Hypercholesterolemia   . PUD (peptic ulcer disease)    Past Surgical History  Procedure Laterality Date  . Coronary angioplasty with stent placement  2012, 2013.    [name] DES to mid RCA. Stents X 2 to unknown artery  . Abdominal hysterectomy    . Tubal ligation    . Breast lumpectomy     Family History  Problem Relation Age of Onset  . CAD Father     CABG  . Heart attack Brother     PTCA  . Hypertension Sister   . Hypertension Father    Social History  Substance  Use Topics  . Smoking status: Never Smoker   . Smokeless tobacco: None  . Alcohol Use: No   OB History    No data available     Review of Systems  A complete 10 system review of systems was obtained and all systems are negative except as noted in the HPI and PMH.    Allergies  Review of patient's allergies indicates no known allergies.  Home Medications   Prior to Admission medications   Medication Sig Start Date End Date Taking? Authorizing Provider  diphenhydramine-acetaminophen (TYLENOL PM) 25-500 MG TABS tablet Take 1 tablet by mouth at bedtime as needed.   Yes Historical Provider, MD  docusate sodium (COLACE) 100 MG capsule Take 100 mg by mouth 2 (two) times daily.   Yes Historical Provider, MD  escitalopram (LEXAPRO) 10 MG tablet Take 10 mg by mouth daily.   Yes Historical Provider, MD  metoprolol (LOPRESSOR) 50 MG tablet Take 50 mg by mouth 3 (three) times daily.   Yes Historical Provider, MD  Multiple Vitamins-Calcium (ONE-A-DAY WOMENS FORMULA PO) Take 1 tablet by mouth daily.   Yes Historical Provider, MD  Multiple Vitamins-Minerals (HAIR/SKIN/NAILS PO) Take 1 tablet by mouth daily.   Yes Historical Provider, MD  omeprazole (PRILOSEC) 20 MG capsule Take  20 mg by mouth daily.   Yes Historical Provider, MD  cephALEXin (KEFLEX) 500 MG capsule Take 1 capsule (500 mg total) by mouth 4 (four) times daily. 01/22/15   Donnetta Hutching, MD  ondansetron (ZOFRAN) 8 MG tablet Take 1 tablet (8 mg total) by mouth every 4 (four) hours as needed. 01/22/15   Donnetta Hutching, MD  ondansetron (ZOFRAN) 8 MG tablet Take 1 tablet (8 mg total) by mouth every 4 (four) hours as needed. 01/22/15   Donnetta Hutching, MD  oxyCODONE-acetaminophen (PERCOCET/ROXICET) 5-325 MG tablet Take 1-2 tablets by mouth every 4 (four) hours as needed for severe pain. 01/22/15   Donnetta Hutching, MD   BP 114/102 mmHg  Pulse 64  Temp(Src) 98.3 F (36.8 C) (Oral)  Resp 15  Ht  (1.549 m)  Wt 145 lb (65.772 kg)  BMI 27.41 kg/m2  SpO2  98% Physical Exam  Constitutional: She is oriented to person, place, and time. She appears well-developed and well-nourished.  HENT:  Head: Normocephalic and atraumatic.  Eyes: Conjunctivae and EOM are normal. Pupils are equal, round, and reactive to light.  Neck: Normal range of motion. Neck supple.  Cardiovascular: Normal rate and regular rhythm.   Pulmonary/Chest: Effort normal and breath sounds normal.  Abdominal: Soft. Bowel sounds are normal. There is tenderness.  Minimal epigastric and RUQ tenderness.  Musculoskeletal: Normal range of motion.  Neurological: She is alert and oriented to person, place, and time.  Skin: Skin is warm and dry.  Psychiatric: She has a normal mood and affect. Her behavior is normal.  Nursing note and vitals reviewed.   ED Course  Procedures (including critical care time)  DIAGNOSTIC STUDIES: Oxygen Saturation is 99% on room air, normal by my interpretation.    COORDINATION OF CARE: 12:32 PM Discussed treatment plan with pt at bedside and pt agreed to plan.Pt will receive basic bloodwork, ultrasound of her gallbladder, and pain management.    Labs Review Labs Reviewed  COMPREHENSIVE METABOLIC PANEL - Abnormal; Notable for the following:    Glucose, Bld 115 (*)    All other components within normal limits  URINALYSIS, ROUTINE W REFLEX MICROSCOPIC (NOT AT [facility]) - Abnormal; Notable for the following:    Specific Gravity, Urine <1.005 (*)    Hgb urine dipstick MODERATE (*)    Leukocytes, UA MODERATE (*)    All other components within normal limits  URINE MICROSCOPIC-ADD ON - Abnormal; Notable for the following:    Squamous Epithelial / LPF FEW (*)    Bacteria, UA FEW (*)    All other components within normal limits  URINE CULTURE  LIPASE, BLOOD  CBC    Imaging Review No results found. I have personally reviewed and evaluated these images and lab results as part of my medical decision-making.   EKG Interpretation None      MDM    Final diagnoses:  RUQ abdominal pain  UTI (lower urinary tract infection)    No acute abdomen. Screening labs show no elevation of hepatic enzymes. White count normal. Minor evidence of a urinary tract infection. Discharge medications Keflex, Percocet, Zofran 8 mg. Follow-up gallbladder ultrasound on Wednesday, October 5.  I personally performed the services described in this documentation, which was scribed in my presence. The recorded information has been reviewed and is accurate.    Donnetta Hutching, MD 01/22/15 [phone]

## 2015-01-22 NOTE — Discharge Instructions (Signed)
Medication for pain, nausea, antibiotic for urinary tract infection. Ultrasound of gallbladder will be scheduled for Wednesday. Follow-up your primary care doctor.

## 2015-01-22 NOTE — ED Notes (Signed)
Having abdominal pain since Friday with nausea.  Rates pain 10/10.  Had diarrhea on Friday.  History of GI issues.

## 2015-01-25 ENCOUNTER — Emergency Department (HOSPITAL_COMMUNITY): Payer: Medicare Other

## 2015-01-25 ENCOUNTER — Inpatient Hospital Stay (HOSPITAL_COMMUNITY)
Admission: EM | Admit: 2015-01-25 | Discharge: 2015-01-30 | DRG: 414 | Disposition: A | Discharge status: Home / Self Care | Payer: Medicare Other | Attending: General Surgery | Admitting: General Surgery

## 2015-01-25 ENCOUNTER — Ambulatory Visit (HOSPITAL_COMMUNITY): Admit: 2015-01-25 | Payer: Medicare Other

## 2015-01-25 ENCOUNTER — Encounter (HOSPITAL_COMMUNITY): Payer: Self-pay | Admitting: Emergency Medicine

## 2015-01-25 ENCOUNTER — Inpatient Hospital Stay (HOSPITAL_COMMUNITY): Payer: Medicare Other

## 2015-01-25 DIAGNOSIS — N179 Acute kidney failure, unspecified: Secondary | ICD-10-CM | POA: Diagnosis not present

## 2015-01-25 DIAGNOSIS — R1084 Generalized abdominal pain: Secondary | ICD-10-CM | POA: Diagnosis not present

## 2015-01-25 DIAGNOSIS — E7800 Pure hypercholesterolemia, unspecified: Secondary | ICD-10-CM | POA: Diagnosis present

## 2015-01-25 DIAGNOSIS — E876 Hypokalemia: Secondary | ICD-10-CM | POA: Diagnosis not present

## 2015-01-25 DIAGNOSIS — K819 Cholecystitis, unspecified: Secondary | ICD-10-CM

## 2015-01-25 DIAGNOSIS — Z23 Encounter for immunization: Secondary | ICD-10-CM | POA: Diagnosis not present

## 2015-01-25 DIAGNOSIS — Z01818 Encounter for other preprocedural examination: Secondary | ICD-10-CM

## 2015-01-25 DIAGNOSIS — M1990 Unspecified osteoarthritis, unspecified site: Secondary | ICD-10-CM | POA: Diagnosis present

## 2015-01-25 DIAGNOSIS — I10 Essential (primary) hypertension: Secondary | ICD-10-CM | POA: Diagnosis present

## 2015-01-25 DIAGNOSIS — I2510 Atherosclerotic heart disease of native coronary artery without angina pectoris: Secondary | ICD-10-CM | POA: Diagnosis present

## 2015-01-25 DIAGNOSIS — R509 Fever, unspecified: Secondary | ICD-10-CM | POA: Diagnosis not present

## 2015-01-25 DIAGNOSIS — M329 Systemic lupus erythematosus, unspecified: Secondary | ICD-10-CM | POA: Diagnosis present

## 2015-01-25 DIAGNOSIS — K750 Abscess of liver: Secondary | ICD-10-CM | POA: Diagnosis not present

## 2015-01-25 DIAGNOSIS — K8000 Calculus of gallbladder with acute cholecystitis without obstruction: Secondary | ICD-10-CM | POA: Diagnosis not present

## 2015-01-25 DIAGNOSIS — I252 Old myocardial infarction: Secondary | ICD-10-CM

## 2015-01-25 DIAGNOSIS — Z8249 Family history of ischemic heart disease and other diseases of the circulatory system: Secondary | ICD-10-CM

## 2015-01-25 DIAGNOSIS — E860 Dehydration: Secondary | ICD-10-CM | POA: Diagnosis not present

## 2015-01-25 DIAGNOSIS — R109 Unspecified abdominal pain: Secondary | ICD-10-CM | POA: Diagnosis not present

## 2015-01-25 DIAGNOSIS — R1010 Upper abdominal pain, unspecified: Secondary | ICD-10-CM | POA: Diagnosis present

## 2015-01-25 DIAGNOSIS — I96 Gangrene, not elsewhere classified: Secondary | ICD-10-CM | POA: Diagnosis present

## 2015-01-25 DIAGNOSIS — R197 Diarrhea, unspecified: Secondary | ICD-10-CM | POA: Diagnosis not present

## 2015-01-25 DIAGNOSIS — R112 Nausea with vomiting, unspecified: Secondary | ICD-10-CM

## 2015-01-25 DIAGNOSIS — K810 Acute cholecystitis: Secondary | ICD-10-CM | POA: Diagnosis not present

## 2015-01-25 DIAGNOSIS — K8020 Calculus of gallbladder without cholecystitis without obstruction: Secondary | ICD-10-CM | POA: Diagnosis not present

## 2015-01-25 DIAGNOSIS — Z955 Presence of coronary angioplasty implant and graft: Secondary | ICD-10-CM

## 2015-01-25 DIAGNOSIS — IMO0002 Reserved for concepts with insufficient information to code with codable children: Secondary | ICD-10-CM

## 2015-01-25 DIAGNOSIS — E785 Hyperlipidemia, unspecified: Secondary | ICD-10-CM | POA: Diagnosis present

## 2015-01-25 LAB — CBC WITH DIFFERENTIAL/PLATELET
BASOS ABS: 0 10*3/uL (ref 0.0–0.1)
BASOS PCT: 0 %
Eosinophils Absolute: 0 10*3/uL (ref 0.0–0.7)
Eosinophils Relative: 0 %
HEMATOCRIT: 36.8 % (ref 36.0–46.0)
HEMOGLOBIN: 12.3 g/dL (ref 12.0–15.0)
LYMPHS PCT: 9 %
Lymphs Abs: 1.4 10*3/uL (ref 0.7–4.0)
MCH: 31.2 pg (ref 26.0–34.0)
MCHC: 33.4 g/dL (ref 30.0–36.0)
MCV: 93.4 fL (ref 78.0–100.0)
Monocytes Absolute: 1.4 10*3/uL — ABNORMAL HIGH (ref 0.1–1.0)
Monocytes Relative: 9 %
NEUTROS ABS: 13.7 10*3/uL — AB (ref 1.7–7.7)
NEUTROS PCT: 82 %
Platelets: 357 10*3/uL (ref 150–400)
RBC: 3.94 MIL/uL (ref 3.87–5.11)
RDW: 12.8 % (ref 11.5–15.5)
WBC: 16.5 10*3/uL — AB (ref 4.0–10.5)

## 2015-01-25 LAB — COMPREHENSIVE METABOLIC PANEL
ALT: 64 U/L — ABNORMAL HIGH (ref 14–54)
ANION GAP: 15 (ref 5–15)
AST: 86 U/L — ABNORMAL HIGH (ref 15–41)
Albumin: 4.2 g/dL (ref 3.5–5.0)
Alkaline Phosphatase: 111 U/L (ref 38–126)
BILIRUBIN TOTAL: 0.9 mg/dL (ref 0.3–1.2)
BUN: 21 mg/dL — ABNORMAL HIGH (ref 6–20)
CHLORIDE: 98 mmol/L — AB (ref 101–111)
CO2: 21 mmol/L — ABNORMAL LOW (ref 22–32)
Calcium: 9.1 mg/dL (ref 8.9–10.3)
Creatinine, Ser: 1.23 mg/dL — ABNORMAL HIGH (ref 0.44–1.00)
GFR calc Af Amer: 54 mL/min — ABNORMAL LOW (ref >60)
GFR, EST NON AFRICAN AMERICAN: 46 mL/min — AB (ref >60)
Glucose, Bld: 145 mg/dL — ABNORMAL HIGH (ref 65–99)
POTASSIUM: 3.6 mmol/L (ref 3.5–5.1)
Sodium: 134 mmol/L — ABNORMAL LOW (ref 135–145)
TOTAL PROTEIN: 8.4 g/dL — AB (ref 6.5–8.1)

## 2015-01-25 LAB — URINALYSIS, ROUTINE W REFLEX MICROSCOPIC
Bilirubin Urine: NEGATIVE
GLUCOSE, UA: NEGATIVE mg/dL
KETONES UR: NEGATIVE mg/dL
Nitrite: NEGATIVE
PH: 6 (ref 5.0–8.0)
Specific Gravity, Urine: 1.025 (ref 1.005–1.030)
Urobilinogen, UA: 0.2 mg/dL (ref 0.0–1.0)

## 2015-01-25 LAB — URINE MICROSCOPIC-ADD ON

## 2015-01-25 LAB — LIPASE, BLOOD: LIPASE: 18 U/L — AB (ref 22–51)

## 2015-01-25 MED ORDER — ONDANSETRON HCL 4 MG/2ML IJ SOLN
4.0000 mg | Freq: Four times a day (QID) | INTRAMUSCULAR | Status: DC | PRN
  Administered 2015-01-29: 4 mg via INTRAVENOUS
  Filled 2015-01-25: qty 2

## 2015-01-25 MED ORDER — SODIUM CHLORIDE 0.9 % IV SOLN
INTRAVENOUS | Status: DC
Start: 2015-01-25 — End: 2015-01-27
  Administered 2015-01-25 – 2015-01-26 (×3): via INTRAVENOUS

## 2015-01-25 MED ORDER — PIPERACILLIN-TAZOBACTAM 3.375 G IVPB 30 MIN
3.3750 g | Freq: Once | INTRAVENOUS | Status: AC
  Administered 2015-01-25: 3.375 g via INTRAVENOUS
  Filled 2015-01-25: qty 50

## 2015-01-25 MED ORDER — MORPHINE SULFATE (PF) 4 MG/ML IV SOLN
4.0000 mg | Freq: Once | INTRAVENOUS | Status: AC
  Administered 2015-01-25: 4 mg via INTRAVENOUS
  Filled 2015-01-25: qty 1

## 2015-01-25 MED ORDER — PANTOPRAZOLE SODIUM 40 MG IV SOLR
40.0000 mg | INTRAVENOUS | Status: DC
  Administered 2015-01-25 – 2015-01-28 (×3): 40 mg via INTRAVENOUS
  Filled 2015-01-25 (×3): qty 40

## 2015-01-25 MED ORDER — ENOXAPARIN SODIUM 40 MG/0.4ML ~~LOC~~ SOLN
40.0000 mg | SUBCUTANEOUS | Status: DC
  Administered 2015-01-25 – 2015-01-26 (×2): 40 mg via SUBCUTANEOUS
  Filled 2015-01-25 (×2): qty 0.4

## 2015-01-25 MED ORDER — ONDANSETRON HCL 4 MG/2ML IJ SOLN
4.0000 mg | Freq: Once | INTRAMUSCULAR | Status: AC
Start: 2015-01-25 — End: 2015-01-25
  Administered 2015-01-25: 4 mg via INTRAVENOUS
  Filled 2015-01-25: qty 2

## 2015-01-25 MED ORDER — IOHEXOL 300 MG/ML  SOLN
100.0000 mL | Freq: Once | INTRAMUSCULAR | Status: AC | PRN
  Administered 2015-01-25: 100 mL via INTRAVENOUS

## 2015-01-25 MED ORDER — ACETAMINOPHEN 325 MG PO TABS: 650.0000 mg | ORAL_TABLET | Freq: Four times a day (QID) | ORAL | Status: DC | PRN

## 2015-01-25 MED ORDER — ACETAMINOPHEN 650 MG RE SUPP: 650.0000 mg | Freq: Four times a day (QID) | RECTAL | Status: DC | PRN

## 2015-01-25 MED ORDER — SODIUM CHLORIDE 0.9 % IV BOLUS (SEPSIS)
1000.0000 mL | Freq: Once | INTRAVENOUS | Status: AC
  Administered 2015-01-25: 1000 mL via INTRAVENOUS

## 2015-01-25 MED ORDER — OXYCODONE HCL 5 MG PO TABS
5.0000 mg | ORAL_TABLET | ORAL | Status: DC | PRN
  Administered 2015-01-25 (×2): 10 mg via ORAL
  Administered 2015-01-25: 5 mg via ORAL
  Administered 2015-01-26 – 2015-01-28 (×4): 10 mg via ORAL
  Administered 2015-01-29: 5 mg via ORAL
  Administered 2015-01-29 (×2): 10 mg via ORAL
  Administered 2015-01-30: 5 mg via ORAL
  Administered 2015-01-30: 10 mg via ORAL
  Filled 2015-01-25: qty 1
  Filled 2015-01-25 (×6): qty 2
  Filled 2015-01-25: qty 1
  Filled 2015-01-25 (×3): qty 2
  Filled 2015-01-25: qty 1

## 2015-01-25 MED ORDER — HYDROMORPHONE HCL 1 MG/ML IJ SOLN
1.0000 mg | INTRAMUSCULAR | Status: DC | PRN
  Administered 2015-01-25 – 2015-01-26 (×5): 1 mg via INTRAVENOUS
  Filled 2015-01-25 (×5): qty 1

## 2015-01-25 MED ORDER — IOHEXOL 300 MG/ML  SOLN
50.0000 mL | Freq: Once | INTRAMUSCULAR | Status: AC | PRN
  Administered 2015-01-25: 50 mL via ORAL

## 2015-01-25 MED ORDER — ONDANSETRON HCL 4 MG PO TABS: 4.0000 mg | ORAL_TABLET | Freq: Four times a day (QID) | ORAL | Status: DC | PRN

## 2015-01-25 MED ORDER — HYDROMORPHONE HCL 1 MG/ML IJ SOLN
1.0000 mg | Freq: Once | INTRAMUSCULAR | Status: AC
  Administered 2015-01-25: 1 mg via INTRAVENOUS
  Filled 2015-01-25: qty 1

## 2015-01-25 MED ORDER — PIPERACILLIN-TAZOBACTAM 3.375 G IVPB
3.3750 g | Freq: Three times a day (TID) | INTRAVENOUS | Status: DC
  Administered 2015-01-25 – 2015-01-29 (×13): 3.375 g via INTRAVENOUS
  Filled 2015-01-25 (×18): qty 50

## 2015-01-25 MED ORDER — METOPROLOL TARTRATE 50 MG PO TABS
50.0000 mg | ORAL_TABLET | Freq: Three times a day (TID) | ORAL | Status: DC
  Administered 2015-01-25 – 2015-01-30 (×15): 50 mg via ORAL
  Filled 2015-01-25 (×15): qty 1

## 2015-01-25 MED ORDER — PREGABALIN 75 MG PO CAPS
75.0000 mg | ORAL_CAPSULE | Freq: Every day | ORAL | Status: DC
  Administered 2015-01-25 – 2015-01-30 (×5): 75 mg via ORAL
  Filled 2015-01-25 (×5): qty 1

## 2015-01-25 NOTE — ED Provider Notes (Signed)
CSN: [id number]     Arrival date & time 01/25/15  0525 History   First MD Initiated Contact with Patient 01/25/15 [id number]     Chief Complaint  Patient presents with  . Abdominal Pain     (Consider location/radiation/quality/duration/timing/severity/associated sxs/prior Treatment) HPI patient reports she is been having upper abdominal pain constantly that started September 29. She states the pain is all the way across her upper abdomen. The pain has been constant but getting worse until the last 2 days it has gotten excruciating. She describes the pain as pressure, dull, and aching. Every once and also get a sharp jabbing pain. She has had nausea and vomiting daily about 3-4 times a day that she describes as bile. She reports yesterday she had chills and had fever to 100.1. She denies any diarrhea. She states she has lost appetite. However before that she states eating any type of food made the pain worse. She denies being on any type of PPI or acid reducer. She states Advil helps her pain. She states she took one Lyrica to see if that would help her pain. She was seen in the ED on October 2 and was given Percocet which she's been taking without relief. She is scheduled to have an outpatient ultrasound done at 8:00 this morning. She states she had ultrasound done about 5 years ago and she did not have gallstones at that time. She reports she has a gastroenterologist in [place]. She was last seen in March. She states she was having similar symptoms then and she was diagnosed with a hiatal hernia and ulcer. She denies been on steroids or any other type of medications for her lupus.  PCP Dr Lowella Bandy  Past Medical History  Diagnosis Date  . Myocardial infarction (HCC) 2012   . CVD (cardiovascular disease)     Stent to mid RCA 2012 [name] 2.5 X 12 mm  . Systemic lupus (HCC)   . Anxiety   . Hypertension   . Arthritis   . Hypercholesterolemia   . PUD (peptic ulcer disease)    Past Surgical History    Procedure Laterality Date  . Coronary angioplasty with stent placement  2012, 2013.    [name] DES to mid RCA. Stents X 2 to unknown artery  . Abdominal hysterectomy    . Tubal ligation    . Breast lumpectomy     Family History  Problem Relation Age of Onset  . CAD Father     CABG  . Heart attack Brother     PTCA  . Hypertension Sister   . Hypertension Father    Social History  Substance Use Topics  . Smoking status: Never Smoker   . Smokeless tobacco: None  . Alcohol Use: No   Disability for lupus  OB History    No data available     Review of Systems  All other systems reviewed and are negative.     Allergies  Review of patient's allergies indicates no known allergies.  Home Medications   Prior to Admission medications   Medication Sig Start Date End Date Taking? Authorizing Provider  cephALEXin (KEFLEX) 500 MG capsule Take 1 capsule (500 mg total) by mouth 4 (four) times daily. 01/22/15   Donnetta Hutching, MD  diphenhydramine-acetaminophen (TYLENOL PM) 25-500 MG TABS tablet Take 1 tablet by mouth at bedtime as needed.    Historical Provider, MD  docusate sodium (COLACE) 100 MG capsule Take 100 mg by mouth 2 (two) times daily.  Historical Provider, MD  escitalopram (LEXAPRO) 10 MG tablet Take 10 mg by mouth daily.    Historical Provider, MD  metoprolol (LOPRESSOR) 50 MG tablet Take 50 mg by mouth 3 (three) times daily.    Historical Provider, MD  Multiple Vitamins-Calcium (ONE-A-DAY WOMENS FORMULA PO) Take 1 tablet by mouth daily.    Historical Provider, MD  Multiple Vitamins-Minerals (HAIR/SKIN/NAILS PO) Take 1 tablet by mouth daily.    Historical Provider, MD  omeprazole (PRILOSEC) 20 MG capsule Take 20 mg by mouth daily.    Historical Provider, MD  ondansetron (ZOFRAN) 8 MG tablet Take 1 tablet (8 mg total) by mouth every 4 (four) hours as needed. 01/22/15   Donnetta Hutching, MD  ondansetron (ZOFRAN) 8 MG tablet Take 1 tablet (8 mg total) by mouth every 4 (four) hours as  needed. 01/22/15   Donnetta Hutching, MD  oxyCODONE-acetaminophen (PERCOCET/ROXICET) 5-325 MG tablet Take 1-2 tablets by mouth every 4 (four) hours as needed for severe pain. 01/22/15   Donnetta Hutching, MD   BP 145/87 mmHg  Pulse 98  Temp(Src) 98.1 F (36.7 C) (Oral)  Resp 20  Ht  (1.549 m)  Wt 145 lb (65.772 kg)  BMI 27.41 kg/m2  SpO2 100%  Vital signs normal    Physical Exam  Constitutional: She is oriented to person, place, and time. She appears well-developed and well-nourished.  Non-toxic appearance. She does not appear ill. No distress.  HENT:  Head: Normocephalic and atraumatic.  Right Ear: External ear normal.  Left Ear: External ear normal.  Nose: Nose normal. No mucosal edema or rhinorrhea.  Mouth/Throat: Mucous membranes are normal. No dental abscesses or uvula swelling.  Dry tongue  Eyes: Conjunctivae and EOM are normal. Pupils are equal, round, and reactive to light.  Neck: Normal range of motion and full passive range of motion without pain. Neck supple.  Cardiovascular: Normal rate, regular rhythm and normal heart sounds.  Exam reveals no gallop and no friction rub.   No murmur heard. Pulmonary/Chest: Effort normal and breath sounds normal. No respiratory distress. She has no wheezes. She has no rhonchi. She has no rales. She exhibits no tenderness and no crepitus.  Abdominal: Soft. Normal appearance and bowel sounds are normal. She exhibits no distension. There is tenderness. There is no rebound and no guarding.    Patient has mild diffuse pain to palpation of her whole abdomen however her worst pain appears to be the upper abdomen bilaterally.  Musculoskeletal: Normal range of motion. She exhibits no edema or tenderness.  Moves all extremities well.   Neurological: She is alert and oriented to person, place, and time. She has normal strength. No cranial nerve deficit.  Skin: Skin is warm, dry and intact. No rash noted. No erythema. No pallor.  Psychiatric: She has a  normal mood and affect. Her speech is normal and behavior is normal. Her mood appears not anxious.  Nursing note and vitals reviewed.   ED Course  Procedures (including critical care time)  Medications  sodium chloride 0.9 % bolus 1,000 mL (1,000 mLs Intravenous New Bag/Given 01/25/15 0558)  morphine 4 MG/ML injection 4 mg (4 mg Intravenous Given 01/25/15 0601)  ondansetron (ZOFRAN) injection 4 mg (4 mg Intravenous Given 01/25/15 0559)  iohexol (OMNIPAQUE) 300 MG/ML solution 50 mL (50 mLs Oral Contrast Given 01/25/15 0654)  iohexol (OMNIPAQUE) 300 MG/ML solution 100 mL (100 mLs Intravenous Contrast Given 01/25/15 0654)   Patient was given IV fluids, for dehydration, IV pain and nausea medication.  Because her pain appears to be more diffuse subtle localized CT scan of the abdomen and pelvis was ordered. I talked to radiology and we ordered a gallbladder ultrasound to be done through the emergency department instead as outpatient.   07:03 Patient was left at change of shift with Dr Estell Harpin to get results of her CT scan and ultrasound.   I have reviewed patient's old records, when I asked her her gastroenterologist was she states "it's in the computer". I do not see any GI notes, or procedure notes. There is no prior ultrasound in her record.    Labs Review Results for orders placed or performed during the hospital encounter of 01/25/15  Comprehensive metabolic panel  Result Value Ref Range   Sodium 134 (L) 135 - 145 mmol/L   Potassium 3.6 3.5 - 5.1 mmol/L   Chloride 98 (L) 101 - 111 mmol/L   CO2 21 (L) 22 - 32 mmol/L   Glucose, Bld 145 (H) 65 - 99 mg/dL   BUN 21 (H) 6 - 20 mg/dL   Creatinine, Ser 1.19 (H) 0.44 - 1.00 mg/dL   Calcium 9.1 8.9 - 14.7 mg/dL   Total Protein 8.4 (H) 6.5 - 8.1 g/dL   Albumin 4.2 3.5 - 5.0 g/dL   AST 86 (H) 15 - 41 U/L   ALT 64 (H) 14 - 54 U/L   Alkaline Phosphatase 111 38 - 126 U/L   Total Bilirubin 0.9 0.3 - 1.2 mg/dL   GFR calc non Af Amer 46 (L) >60  mL/min   GFR calc Af Amer 54 (L) >60 mL/min   Anion gap 15 5 - 15  Lipase, blood  Result Value Ref Range   Lipase 18 (L) 22 - 51 U/L  CBC with Differential  Result Value Ref Range   WBC 16.5 (H) 4.0 - 10.5 K/uL   RBC 3.94 3.87 - 5.11 MIL/uL   Hemoglobin 12.3 12.0 - 15.0 g/dL   HCT 82.9 56.2 - 13.0 %   MCV 93.4 78.0 - 100.0 fL   MCH 31.2 26.0 - 34.0 pg   MCHC 33.4 30.0 - 36.0 g/dL   RDW 86.5 78.4 - 69.6 %   Platelets 357 150 - 400 K/uL   Neutrophils Relative % 82 %   Neutro Abs 13.7 (H) 1.7 - 7.7 K/uL   Lymphocytes Relative 9 %   Lymphs Abs 1.4 0.7 - 4.0 K/uL   Monocytes Relative 9 %   Monocytes Absolute 1.4 (H) 0.1 - 1.0 K/uL   Eosinophils Relative 0 %   Eosinophils Absolute 0.0 0.0 - 0.7 K/uL   Basophils Relative 0 %   Basophils Absolute 0.0 0.0 - 0.1 K/uL   Laboratory interpretation all normal except new leukocytosis, elevation of LFT's, renal insuffiency     Imaging Review No results found. I have personally reviewed and evaluated these images and lab results as part of my medical decision-making.   EKG Interpretation None      MDM   Final diagnoses:  Generalized abdominal pain  Nausea and vomiting, vomiting of unspecified type    Disposition pending  Devoria Albe, MD, Concha Pyo, MD 01/25/15 [id number]

## 2015-01-25 NOTE — ED Notes (Signed)
Pt states she has not ate since Sunday

## 2015-01-25 NOTE — ED Notes (Signed)
Pt c/o abd pain since being seen here Sunday. Pt has appt for abd ultrasound at 8am this morning. Pt states she has vomited x 5 in 24 hours.

## 2015-01-25 NOTE — ED Notes (Signed)
Jenkins at bedside.

## 2015-01-25 NOTE — ED Notes (Signed)
Called Dr.Jenkins,per verbal order Dr.Zammit, for a consult.

## 2015-01-25 NOTE — Consult Note (Signed)
Reason for Consult: Cholecystitis, cholelithiasis Referring Physician: ER  Sheila [name] is an 59 y.o. female.  HPI: Patient is a 59 year old white female with a history of coronary artery disease and hypertension presented with 5 day history of worsening right upper quadrant abdominal pain. Ultrasound and CT scan the abdomen revealed acute cholecystitis with gas within the lumen as well as possible pericholecystic abscess. Patient states that this is her first episode. She is being admitted to the hospital for further evaluation treatment.  Past Medical History  Diagnosis Date  . Myocardial infarction ([place]) 2012   . CVD (cardiovascular disease)     Stent to mid RCA 2012 [name] 2.5 X 12 mm  . Systemic lupus ([place])   . Anxiety   . Hypertension   . Arthritis   . Hypercholesterolemia   . PUD (peptic ulcer disease)     Past Surgical History  Procedure Laterality Date  . Coronary angioplasty with stent placement  2012, 2013.    [name] DES to mid RCA. Stents X 2 to unknown artery  . Abdominal hysterectomy    . Tubal ligation    . Breast lumpectomy      Family History  Problem Relation Age of Onset  . CAD Father     CABG  . Heart attack Brother     PTCA  . Hypertension Sister   . Hypertension Father     Social History:  reports that she has never smoked. She does not have any smokeless tobacco history on file. She reports that she does not drink alcohol or use illicit drugs.  Allergies: No Known Allergies  Medications: I have reviewed the patient's current medications.  Results for orders placed or performed during the hospital encounter of 01/25/15 (from the past 48 hour(s))  Comprehensive metabolic panel     Status: Abnormal   Collection Time: 01/25/15  6:30 AM  Result Value Ref Range   Sodium 134 (L) 135 - 145 mmol/L   Potassium 3.6 3.5 - 5.1 mmol/L   Chloride 98 (L) 101 - 111 mmol/L   CO2 21 (L) 22 - 32 mmol/L   Glucose, Bld 145 (H) 65 - 99 mg/dL   BUN 21 (H) 6 - 20  mg/dL   Creatinine, Ser 1.23 (H) 0.44 - 1.00 mg/dL   Calcium 9.1 8.9 - 10.3 mg/dL   Total Protein 8.4 (H) 6.5 - 8.1 g/dL   Albumin 4.2 3.5 - 5.0 g/dL   AST 86 (H) 15 - 41 U/L   ALT 64 (H) 14 - 54 U/L   Alkaline Phosphatase 111 38 - 126 U/L   Total Bilirubin 0.9 0.3 - 1.2 mg/dL   GFR calc non Af Amer 46 (L) >60 mL/min   GFR calc Af Amer 54 (L) >60 mL/min    Comment: (NOTE) The eGFR has been calculated using the CKD EPI equation. This calculation has not been validated in all clinical situations. eGFR's persistently <60 mL/min signify possible Chronic Kidney Disease.    Anion gap 15 5 - 15  Lipase, blood     Status: Abnormal   Collection Time: 01/25/15  6:30 AM  Result Value Ref Range   Lipase 18 (L) 22 - 51 U/L  CBC with Differential     Status: Abnormal   Collection Time: 01/25/15  6:30 AM  Result Value Ref Range   WBC 16.5 (H) 4.0 - 10.5 K/uL   RBC 3.94 3.87 - 5.11 MIL/uL   Hemoglobin 12.3 12.0 - 15.0 g/dL   HCT  36.8 36.0 - 46.0 %   MCV 93.4 78.0 - 100.0 fL   MCH 31.2 26.0 - 34.0 pg   MCHC 33.4 30.0 - 36.0 g/dL   RDW 12.8 11.5 - 15.5 %   Platelets 357 150 - 400 K/uL   Neutrophils Relative % 82 %   Neutro Abs 13.7 (H) 1.7 - 7.7 K/uL   Lymphocytes Relative 9 %   Lymphs Abs 1.4 0.7 - 4.0 K/uL   Monocytes Relative 9 %   Monocytes Absolute 1.4 (H) 0.1 - 1.0 K/uL   Eosinophils Relative 0 %   Eosinophils Absolute 0.0 0.0 - 0.7 K/uL   Basophils Relative 0 %   Basophils Absolute 0.0 0.0 - 0.1 K/uL    Ct Abdomen Pelvis W Contrast  01/25/2015   CLINICAL DATA:  Diffuse abdominal pain.  Fever and diarrhea  EXAM: CT ABDOMEN AND PELVIS WITH CONTRAST  TECHNIQUE: Multidetector CT imaging of the abdomen and pelvis was performed using the standard protocol following bolus administration of intravenous contrast.  CONTRAST:  61m OMNIPAQUE IOHEXOL 300 MG/ML SOLN, 1013mOMNIPAQUE IOHEXOL 300 MG/ML SOLN  COMPARISON:  None.  FINDINGS: Lower chest: Minimal bibasilar atelectasis. No infiltrate  or effusion in the lung bases. Heart size upper normal.  Hepatobiliary: Gallbladder is distended with gallbladder wall thickening. Mild stranding in the pericholecystic fat. There is a small gas bubble in the fundus of the gallbladder. Multiple calcified gallstones are present. There is gas in the cystic duct and common bile duct. The common bile duct is nondilated. Anterior to the cystic duct in the porta hepatis there is a multilocular cyst measuring 33 mm in diameter. No additional liver lesions identified. No free air no free fluid.  Pancreas: Normal pancreas. Negative for pancreatitis or pancreatic mass.  Spleen: Negative  Adrenals/Urinary Tract: Negative for renal obstruction. No renal mass or renal calculi. Urinary bladder normal.  Stomach/Bowel: Negative for bowel obstruction. Small hiatal hernia. Negative for bowel edema. Appendix not visualized.  Vascular/Lymphatic: Atherosclerotic calcification in the aorta and iliac arteries. Negative for aortic aneurysm.  Reproductive: Hysterectomy.  Negative for pelvic mass.  Other: No free fluid.  No free air.  Negative for adenopathy.  Musculoskeletal: Minimal lumbar degenerative change. No acute skeletal abnormality.  IMPRESSION: Cholelithiasis. Distended gallbladder with gallbladder wall thickening and mild pericholecystic edema. Findings suggest acute cholecystitis. In addition, there is gas in the gallbladder lumen as well as in the cystic duct and common bile duct compatible with cholecystitis.  33 mm multilocular cyst in the porta hepatis. This may represent a biliary cystadenoma or septated hepatic cyst.  Appendix not visualized.   Electronically Signed   By: ChFranchot Gallo.D.   On: 01/25/2015 07:17   UsKoreabdomen Limited Ruq  01/25/2015   CLINICAL DATA:  [age]ear old with acute onset of upper abdominal pain associated with nausea, vomiting and diarrhea which began 3 days ago.  EXAM: US[place]oreaBDOMEN LIMITED - RIGHT UPPER QUADRANT  COMPARISON:  CT abdomen and  pelvis performed earlier same date.  FINDINGS: Gallbladder:  Numerous shadowing gallstones and echogenic sludge. Gallbladder wall thickening, likely overestimated on ultrasound due to the gas within the gallbladder lumen as noted on the earlier CT. Positive sonographic Murphy sign according to the ultrasound technologist.  Common bile duct:  Diameter: Approximately 5 mm diameter. Gas within the common bile duct as noted on the CT.  Liver:  Multilocular fluid collection centrally in the liver as noted on CT measuring approximately 3.3 x 2.1 cm. No other focal hepatic  parenchymal abnormality. Patent portal vein with antegrade flow.  IMPRESSION: 1. Cholelithiasis and acute cholecystitis. Gas within the gallbladder fundus and the nondistended common bile duct as noted on earlier CT, likely due to a gas producing organism. Less likely, the gas could arise due to a fistula with adjacent bowel. 2. Multilocular fluid collection centrally in the liver as noted on earlier CT, likely a liver abscess, given its proximity to the infected gallbladder.   Electronically Signed   By: Evangeline Dakin M.D.   On: 01/25/2015 07:59    ROS: See chart Blood pressure 169/72, pulse 94, temperature 98.5 F (36.9 C), temperature source Oral, resp. rate 20, height 5' 1"  (1.549 m), weight 66.3 kg (146 lb 2.6 oz), SpO2 100 %. Physical Exam: Pleasant black female in no acute distress. Abdomen is soft with tenderness to the right upper quadrant to palpation. No hepatosplenomegaly, masses, or hernias identified. Labs and x-rays reviewed.  Assessment/Plan: Impression: Acute cholecystitis, cholelithiasis, coronary artery disease Plan: Patient is being admitted the hospital by the hospitalist for further management treatment. IV Zosyn has been started. Will see what her cardiac status is. Anticipate laparoscopic cholecystectomy in next few days.  Sheila [name] A 01/25/2015, 10:10 AM

## 2015-01-25 NOTE — Progress Notes (Signed)
ANTIBIOTIC CONSULT NOTE - INITIAL  Pharmacy Consult for Zosyn Indication: intra-abdominal infection  No Known Allergies  Patient Measurements: Height:  (154.9 cm) Weight: 146 lb 2.6 oz (66.3 kg) IBW/kg (Calculated) : 47.8  Vital Signs: Temp: 98.5 F (36.9 C) (10/05 0952) Temp Source: Oral (10/05 0952) BP: 169/72 mmHg (10/05 0952) Pulse Rate: 94 (10/05 0952) Intake/Output from previous day:   Intake/Output from this shift:    Labs:  Recent Labs  01/22/15 1255 01/25/15 0630  WBC 9.1 16.5*  HGB 12.3 12.3  PLT 338 357  CREATININE 0.80 1.23*   Estimated Creatinine Clearance: 41.9 mL/min (by C-G formula based on Cr of 1.23). No results for input(s): VANCOTROUGH, VANCOPEAK, VANCORANDOM, GENTTROUGH, GENTPEAK, GENTRANDOM, TOBRATROUGH, TOBRAPEAK, TOBRARND, AMIKACINPEAK, AMIKACINTROU, AMIKACIN in the last 72 hours.   Microbiology: No results found for this or any previous visit (from the past 720 hour(s)).  Medical History: Past Medical History  Diagnosis Date  . Myocardial infarction (HCC) 2012   . CVD (cardiovascular disease)     Stent to mid RCA 2012 [name] 2.5 X 12 mm  . Systemic lupus (HCC)   . Anxiety   . Hypertension   . Arthritis   . Hypercholesterolemia   . PUD (peptic ulcer disease)    Anti-infectives    Start     Dose/Rate Route Frequency Ordered Stop   01/25/15 1600  piperacillin-tazobactam (ZOSYN) IVPB 3.375 g     3.375 g 12.5 mL/hr over 240 Minutes Intravenous Every 8 hours 01/25/15 1044     01/25/15 0845  piperacillin-tazobactam (ZOSYN) IVPB 3.375 g     3.375 g 100 mL/hr over 30 Minutes Intravenous  Once 01/25/15 0834 01/25/15 0910      Assessment: Patient is a 59 year old white female with a history of coronary artery disease and hypertension presented with 5 day history of worsening right upper quadrant abdominal pain. Ultrasound and CT scan the abdomen revealed acute cholecystitis with gas within the lumen as well as possible  pericholecystic abscess. Patient states that this is her first episode. She is being admitted to the hospital for further evaluation treatment  Goal of Therapy:  Eradicate infection.  Plan:  Zosyn 3.375gm IV q8h, each dose over 4 hrs Monitor labs, progress, and c/s  Margo Aye, Ammanda Dobbins A 01/25/2015,10:45 AM

## 2015-01-25 NOTE — H&P (Signed)
[facility] History and Physical  Sheila [name] ZOX:[id number] DOB: 07-04-1955 DOA: 01/25/2015  Referring physician: Devoria Albe, MD PCP: [name], MD   Chief Complaint: Abdominal pain  HPI: Sheila [name] is a 59 y.o. female with history of MI, CVD, HTN, HLD, peptic ulcer disease and lupus who presents to the ED with abdominal pain. Patient states that she had severe abdominal pain with onset of 9/29 and came into the ER 10/2 secondary to worsening quality. She reports that pain is intermittent and is localized at center near rib cage with a dull, aching quality. She reports nausea, vomiting of bile yesterday with decreased oral intake and decreased appetite. Her last bowel movement was 2 days ago. She had a mild fever and chills yesterday. Denies any dysuria, chest pain, SOB. She was evaluated in the ER where imagining indicated possible cholecystitis. She has been referred for admission.   Review of Systems:  Constitutional:  Fever, chills No weight loss, night sweats, fatigue.  HEENT:  No headaches, Difficulty swallowing,Tooth/dental problems,Sore throat,  No sneezing, itching, ear ache, nasal congestion, post nasal drip,  Cardio-vascular:  No chest pain, Orthopnea, PND, swelling in lower extremities, anasarca, dizziness, palpitations  GI:  Abdominal pain, vomiting, nausea, loss of appetite, change of bowel habits No heartburn, indigestion, diarrhea Resp:  No shortness of breath with exertion or at rest. No excess mucus, no productive cough, No non-productive cough, No coughing up of blood.No change in color of mucus.No wheezing.No chest wall deformity  Skin:  no rash or lesions.  GU:  no dysuria, change in color of urine, no urgency or frequency. No flank pain.  Musculoskeletal:  No joint pain or swelling. No decreased range of motion. No back pain.  Psych:  No change in mood or affect. No depression or anxiety. No memory loss.   Past Medical History  Diagnosis Date    . Myocardial infarction (HCC) 2012   . CVD (cardiovascular disease)     Stent to mid RCA 2012 [name] 2.5 X 12 mm  . Systemic lupus (HCC)   . Anxiety   . Hypertension   . Arthritis   . Hypercholesterolemia   . PUD (peptic ulcer disease)    Past Surgical History  Procedure Laterality Date  . Coronary angioplasty with stent placement  2012, 2013.    [name] DES to mid RCA. Stents X 2 to unknown artery  . Abdominal hysterectomy    . Tubal ligation    . Breast lumpectomy     Social History:  reports that she has never smoked. She does not have any smokeless tobacco history on file. She reports that she does not drink alcohol or use illicit drugs.  No Known Allergies  Family History  Problem Relation Age of Onset  . CAD Father     CABG  . Heart attack Brother     PTCA  . Hypertension Sister   . Hypertension Father     Prior to Admission medications   Medication Sig Start Date End Date Taking? Authorizing Provider  cephALEXin (KEFLEX) 500 MG capsule Take 1 capsule (500 mg total) by mouth 4 (four) times daily. 01/22/15  Yes Donnetta Hutching, MD  docusate sodium (COLACE) 100 MG capsule Take 100 mg by mouth 2 (two) times daily.   Yes Historical Provider, MD  escitalopram (LEXAPRO) 10 MG tablet Take 10 mg by mouth daily.   Yes Historical Provider, MD  Ibuprofen-Diphenhydramine Cit (ADVIL PM PO) Take 2-3 tablets by mouth at bedtime.   Yes  Historical Provider, MD  metoprolol (LOPRESSOR) 50 MG tablet Take 50 mg by mouth 3 (three) times daily.   Yes Historical Provider, MD  Multiple Vitamins-Calcium (ONE-A-DAY WOMENS FORMULA PO) Take 1 tablet by mouth daily.   Yes Historical Provider, MD  Multiple Vitamins-Minerals (HAIR/SKIN/NAILS PO) Take 1 tablet by mouth daily.   Yes Historical Provider, MD  omeprazole (PRILOSEC) 40 MG capsule Take 40 mg by mouth daily.   Yes Historical Provider, MD  ondansetron (ZOFRAN) 8 MG tablet Take 1 tablet (8 mg total) by mouth every 4 (four) hours as needed. Patient  taking differently: Take 8 mg by mouth every 4 (four) hours as needed for nausea or vomiting.  01/22/15  Yes Donnetta Hutching, MD  oxyCODONE-acetaminophen (PERCOCET/ROXICET) 5-325 MG tablet Take 1-2 tablets by mouth every 4 (four) hours as needed for severe pain. 01/22/15  Yes Donnetta Hutching, MD  pregabalin (LYRICA) 75 MG capsule Take 75 mg by mouth daily.   Yes Historical Provider, MD   Physical Exam: Filed Vitals:   01/25/15 0534 01/25/15 0800  BP: 145/87 183/85  Pulse: 98 90  Temp: 98.1 F (36.7 C)   TempSrc: Oral   Resp: 20 20  Height:  (1.549 m)   Weight: 65.772 kg (145 lb)   SpO2: 100% 100%    Wt Readings from Last 3 Encounters:  01/25/15 65.772 kg (145 lb)  01/22/15 65.772 kg (145 lb)  03/20/14 68.04 kg (150 lb)    General:  Appears calm and comfortable ENT: Mucous membranes dry Cardiovascular: RRR, no m/r/g. No LE edema. Respiratory: CTA bilaterally, no w/r/r. Normal respiratory effort. Abdomen: soft, diffusely tender more at RUQ, positive bowel sounds  Psychiatric: grossly normal mood and affect, speech fluent and appropriate          Labs on Admission:  Basic Metabolic Panel:  Recent Labs Lab 01/22/15 1255 01/25/15 0630  NA 141 134*  K 4.1 3.6  CL 105 98*  CO2 28 21*  GLUCOSE 115* 145*  BUN 14 21*  CREATININE 0.80 1.23*  CALCIUM 9.0 9.1   Liver Function Tests:  Recent Labs Lab 01/22/15 1255 01/25/15 0630  AST 24 86*  ALT 18 64*  ALKPHOS 121 111  BILITOT 0.8 0.9  PROT 7.6 8.4*  ALBUMIN 4.2 4.2    Recent Labs Lab 01/22/15 1255 01/25/15 0630  LIPASE 28 18*   CBC:  Recent Labs Lab 01/22/15 1255 01/25/15 0630  WBC 9.1 16.5*  NEUTROABS  --  13.7*  HGB 12.3 12.3  HCT 39.0 36.8  MCV 95.6 93.4  PLT 338 357    Radiological Exams on Admission: Ct Abdomen Pelvis W Contrast  01/25/2015   CLINICAL DATA:  Diffuse abdominal pain.  Fever and diarrhea  EXAM: CT ABDOMEN AND PELVIS WITH CONTRAST  TECHNIQUE: Multidetector CT imaging of the abdomen  and pelvis was performed using the standard protocol following bolus administration of intravenous contrast.  CONTRAST:  50mL OMNIPAQUE IOHEXOL 300 MG/ML SOLN, OMNIPAQUE IOHEXOL 300 MG/ML SOLN  COMPARISON:  None.  FINDINGS: Lower chest: Minimal bibasilar atelectasis. No infiltrate or effusion in the lung bases. Heart size upper normal.  Hepatobiliary: Gallbladder is distended with gallbladder wall thickening. Mild stranding in the pericholecystic fat. There is a small gas bubble in the fundus of the gallbladder. Multiple calcified gallstones are present. There is gas in the cystic duct and common bile duct. The common bile duct is nondilated. Anterior to the cystic duct in the porta hepatis there is a multilocular cyst measuring  33 mm in diameter. No additional liver lesions identified. No free air no free fluid.  Pancreas: Normal pancreas. Negative for pancreatitis or pancreatic mass.  Spleen: Negative  Adrenals/Urinary Tract: Negative for renal obstruction. No renal mass or renal calculi. Urinary bladder normal.  Stomach/Bowel: Negative for bowel obstruction. Small hiatal hernia. Negative for bowel edema. Appendix not visualized.  Vascular/Lymphatic: Atherosclerotic calcification in the aorta and iliac arteries. Negative for aortic aneurysm.  Reproductive: Hysterectomy.  Negative for pelvic mass.  Other: No free fluid.  No free air.  Negative for adenopathy.  Musculoskeletal: Minimal lumbar degenerative change. No acute skeletal abnormality.  IMPRESSION: Cholelithiasis. Distended gallbladder with gallbladder wall thickening and mild pericholecystic edema. Findings suggest acute cholecystitis. In addition, there is gas in the gallbladder lumen as well as in the cystic duct and common bile duct compatible with cholecystitis.  33 mm multilocular cyst in the porta hepatis. This may represent a biliary cystadenoma or septated hepatic cyst.  Appendix not visualized.   Electronically Signed   By: Marlan Palau  M.D.   On: 01/25/2015 07:17   US Abdomen Limited Ruq  01/25/2015   CLINICAL DATA:  59 year old with acute onset of upper abdominal pain associated with nausea, vomiting and diarrhea which began 3 days ago.  EXAM: US ABDOMEN LIMITED - RIGHT UPPER QUADRANT  COMPARISON:  CT abdomen and pelvis performed earlier same date.  FINDINGS: Gallbladder:  Numerous shadowing gallstones and echogenic sludge. Gallbladder wall thickening, likely overestimated on ultrasound due to the gas within the gallbladder lumen as noted on the earlier CT. Positive sonographic Murphy sign according to the ultrasound technologist.  Common bile duct:  Diameter: Approximately 5 mm diameter. Gas within the common bile duct as noted on the CT.  Liver:  Multilocular fluid collection centrally in the liver as noted on CT measuring approximately 3.3 x 2.1 cm. No other focal hepatic parenchymal abnormality. Patent portal vein with antegrade flow.  IMPRESSION: 1. Cholelithiasis and acute cholecystitis. Gas within the gallbladder fundus and the nondistended common bile duct as noted on earlier CT, likely due to a gas producing organism. Less likely, the gas could arise due to a fistula with adjacent bowel. 2. Multilocular fluid collection centrally in the liver as noted on earlier CT, likely a liver abscess, given its proximity to the infected gallbladder.   Electronically Signed   By: Hulan Saas M.D.   On: 01/25/2015 07:59    Assessment/Plan Active Problems:   History of lupus   Acute cholecystitis   Liver abscess   CAD (coronary artery disease)   HTN (hypertension)   AKI (acute kidney injury) (HCC)   Dehydration                  1. Acute cholecytitis. CT abdomen revealed Distended gallbladder with gallbladder wall thickening and mild pericholecystic edema. Findings suggest acute cholecystitis.General surgery has evaluated the patient and will plan on cholecystectomy. Will give IVF and pain meds. Will also start patient on abx  coverage of Zosyn. 2. Liver abscess noted on abdominal U/s and felt to be related to infected adjacent gallbladder. Continue current abx.  3. CAD with h/o of cardiac stents in 2012. Pt reports that she took Plavix 2 years after stent placement, but was taken off by her cardiologist. She denies taking any aspirin or Plavix at this time due to history of peptic ulcer disease. She has not had any recent chest pain or shortness of breath. Will check EKG and CXR preoperatively.  4. HTN.  BP is likely elevated due to acute pain. Continue Beta Blockers. 5. AKI likely related to dehydration. Creatinine mildly elevated at 1.23. Will continue IVF and repeat BMP in the am.  6. Dehydration. Continue IVF  7. Systemic Lupus. She does not report being on any specific therapy. Will continue to monitor 8. Pre-op evaluation. Will check EKG and chest xray to complete work up. Since she has not had any recent cardiac event or chest pain, I feel she would be considered low risk for surgery, provided her work up is unremarkable.    Code Status: Full DVT Prophylaxis: Lovenox Family discussion: Discussed plan in detail with patient and family. No further concerns at this time. Disposition Plan: Discharge home when improved  Time spent: 55 minutes  Erick Blinks, M.D. [facility] Pager [phone]

## 2015-01-26 DIAGNOSIS — I2510 Atherosclerotic heart disease of native coronary artery without angina pectoris: Secondary | ICD-10-CM

## 2015-01-26 DIAGNOSIS — K810 Acute cholecystitis: Secondary | ICD-10-CM

## 2015-01-26 DIAGNOSIS — N179 Acute kidney failure, unspecified: Secondary | ICD-10-CM

## 2015-01-26 DIAGNOSIS — K750 Abscess of liver: Secondary | ICD-10-CM

## 2015-01-26 DIAGNOSIS — E860 Dehydration: Secondary | ICD-10-CM

## 2015-01-26 LAB — COMPREHENSIVE METABOLIC PANEL
ALK PHOS: 100 U/L (ref 38–126)
ALT: 155 U/L — AB (ref 14–54)
AST: 208 U/L — AB (ref 15–41)
Albumin: 3.2 g/dL — ABNORMAL LOW (ref 3.5–5.0)
Anion gap: 7 (ref 5–15)
BILIRUBIN TOTAL: 0.4 mg/dL (ref 0.3–1.2)
BUN: 23 mg/dL — AB (ref 6–20)
CALCIUM: 8.6 mg/dL — AB (ref 8.9–10.3)
CHLORIDE: 104 mmol/L (ref 101–111)
CO2: 23 mmol/L (ref 22–32)
CREATININE: 1.21 mg/dL — AB (ref 0.44–1.00)
GFR, EST AFRICAN AMERICAN: 55 mL/min — AB (ref >60)
GFR, EST NON AFRICAN AMERICAN: 47 mL/min — AB (ref >60)
Glucose, Bld: 140 mg/dL — ABNORMAL HIGH (ref 65–99)
Potassium: 3.9 mmol/L (ref 3.5–5.1)
Sodium: 134 mmol/L — ABNORMAL LOW (ref 135–145)
Total Protein: 7.1 g/dL (ref 6.5–8.1)

## 2015-01-26 LAB — CBC
HCT: 31.5 % — ABNORMAL LOW (ref 36.0–46.0)
Hemoglobin: 10 g/dL — ABNORMAL LOW (ref 12.0–15.0)
MCH: 30.1 pg (ref 26.0–34.0)
MCHC: 31.7 g/dL (ref 30.0–36.0)
MCV: 94.9 fL (ref 78.0–100.0)
PLATELETS: 358 10*3/uL (ref 150–400)
RBC: 3.32 MIL/uL — AB (ref 3.87–5.11)
RDW: 13.4 % (ref 11.5–15.5)
WBC: 16.3 10*3/uL — AB (ref 4.0–10.5)

## 2015-01-26 LAB — SURGICAL PCR SCREEN
MRSA, PCR: NEGATIVE
Staphylococcus aureus: NEGATIVE

## 2015-01-26 NOTE — Progress Notes (Signed)
Patient seen and chart reviewed. We will proceed with laparoscopic cholecystectomy tomorrow. The risks and benefits of the procedure including bleeding, infection, hepatobiliary injury, the possibility of an open procedure were fully explained to the patient, who gave informed consent.

## 2015-01-26 NOTE — Progress Notes (Signed)
PROGRESS NOTE  Sheila [name] WUJ:[id number] DOB: Mar 05, 1956 DOA: 01/25/2015 PCP: [name], MD  Summary: 59 yo female with h/o MI, CVD, HTN, HLD, peptic ulcer disease and lupus  presented to the ED with abdominal pain. Patient was seen 10/2 for worsening abdominal pain and was treated for UTI and RUQ pain. She had a follow up U/S performed on 10/5 that showed cholelithiasis and acute cholecystitis. While in the ED, her labs revealed BUN/creatinine 21/1.23, lipase of 18, AST 86, ALT 64 and elevated WBC at 16.5. Her CT abdomen and pelvis was consistent with cholelithiasis and acute cholecystitis. Patient was admitted for further management.   Assessment/Plan: 1. Acute cholecystitis. General surgery following and will perform laparoscopic cholecystectomy on 10/7.  2. Liver abscess noted on abdominal U/S, likely related to infected adjacent gallbladder.  3. Possible UTI. Afebrile. WBC 16.3. Continue abx.  4. AKI likely related to acute infection and dehydration, stable. Creatinine 1.21 today.    5. Dehydration  6. CAD with h/o of cardiac stents in 2012. She has not had any recent chest pain or shortness of breath. CXR and EKG unremarkable. 7. HTN, stable. Continue beta-blocker 8. Systemic Lupus. She does not report being on any specific therapy. Continue to monitor   Overall stable.   Continue IV abx and IVF. Repeat CBC and CMP in the am.   Surgery to perform laparoscopic cholecystectomy tomorrow.    Code Status: Full DVT Prophylaxis: Lovenox Family discussion: Discussed with patient who understands and has no concerns at this time. Disposition Plan: Discharge home when improved  [name], MD  [facility]  Pager [phone] If 7PM-7AM, please contact night-coverage at [email or web address], password [facility] 01/26/2015, 7:19 AM  LOS: 1 day   Consultants:  General Surgery  Procedures:    Antibiotics:  Zosyn 10/5>>  HPI/Subjective: Has pain in her upper abdomen that is  only relieved temporarily with medication. Also reports nausea but denies any vomiting. Denies any CP or SOB.   Objective: Filed Vitals:   01/25/15 0952 01/25/15 1329 01/25/15 2135 01/26/15 0457  BP: 169/72 137/68 166/80 160/69  Pulse: 94 80 87 92  Temp: 98.5 F (36.9 C) 98.4 F (36.9 C) 99.5 F (37.5 C) 99.4 F (37.4 C)  TempSrc: Oral Oral Oral Oral  Resp: Height:  (1.549 m)     Weight: 66.3 kg (146 lb 2.6 oz)     SpO2: 100% 97% 93% 97%    Intake/Output Summary (Last 24 hours) at 01/26/15 0719 Last data filed at 01/26/15 0631  Gross per 24 hour  Intake 2686.67 ml  Output    150 ml  Net 2536.67 ml     Filed Weights   01/25/15 0534 01/25/15 0952  Weight: 65.772 kg (145 lb) 66.3 kg (146 lb 2.6 oz)    Exam:  VSS, afebrile, not hypoxic General:  Appears comfortable, calm. Cardiovascular: Regular rate and rhythm, no murmur, rub or gallop. No lower extremity edema. Respiratory: Clear to auscultation bilaterally, no wheezes, rales or rhonchi. Normal respiratory effort. Abdomen: soft, mild generalized tenderness Psychiatric: grossly normal mood and affect, speech fluent and appropriate  New data reviewed:  BMP no significant change   Alkaline phosphatase and tbili normal, AST 208, ALT 155  WBC 16.3, Hgb 10.0  UA grossly positive  Pertinent data since admission:  EKG SR, LVH  CT ABDOMEN AND PELVIS -Cholelithiasis. Distended gallbladder with gallbladder wall thickening and mild pericholecystic edema. Findings suggest acute cholecystitis. In addition, there is gas in  the gallbladder lumen as well as in the cystic duct and common bile duct compatible with cholecystitis. -33 mm multilocular cyst in the porta hepatis. This may represent a biliary cystadenoma or septated hepatic cyst. -Appendix not visualized.  CXR: NAD  U/S RUQ -Cholelithiasis and acute cholecystitis. Gas within the gallbladder fundus and the nondistended common bile duct as  noted on earlier CT, likely due to a gas producing organism. Less likely, the gas could arise due to a fistula with adjacent bowel. -Multilocular fluid collection centrally in the liver as noted on earlier CT, likely a liver abscess, given its proximity to the infected gallbladder.  Pending data:    Scheduled Meds: . enoxaparin (LOVENOX) injection  40 mg Subcutaneous Q24H  . metoprolol  50 mg Oral TID  . pantoprazole (PROTONIX) IV  40 mg Intravenous Q24H  . piperacillin-tazobactam (ZOSYN)  IV  3.375 g Intravenous Q8H  . pregabalin  75 mg Oral Daily   Continuous Infusions: . sodium chloride 100 mL/hr at 01/25/15 2046    Principal Problem:   Acute cholecystitis Active Problems:   History of lupus   Liver abscess   CAD (coronary artery disease)   HTN (hypertension)   AKI (acute kidney injury) (HCC)   Dehydration   Time spent 25 minutes   By signing my name below, I, Burnett Harry attest that this documentation has been prepared under the direction and in the presence of [name], MD Electronically signed: Burnett Harry, Scribe.   01/26/2015 2:24pm  I personally performed the services described in this documentation. All medical record entries made by the scribe were at my direction. I have reviewed the chart and agree that the record reflects my personal performance and is accurate and complete. [name], MD

## 2015-01-27 ENCOUNTER — Encounter (HOSPITAL_COMMUNITY): Payer: Self-pay | Admitting: Anesthesiology

## 2015-01-27 ENCOUNTER — Inpatient Hospital Stay (HOSPITAL_COMMUNITY): Payer: Medicare Other | Admitting: Anesthesiology

## 2015-01-27 ENCOUNTER — Encounter (HOSPITAL_COMMUNITY)
Admission: EM | Disposition: A | Discharge status: Home / Self Care | Payer: Self-pay | Source: Home / Self Care | Attending: General Surgery

## 2015-01-27 HISTORY — PX: CHOLECYSTECTOMY: SHX55

## 2015-01-27 LAB — COMPREHENSIVE METABOLIC PANEL
ALBUMIN: 2.9 g/dL — AB (ref 3.5–5.0)
ALK PHOS: 106 U/L (ref 38–126)
ALT: 210 U/L — AB (ref 14–54)
AST: 203 U/L — AB (ref 15–41)
Anion gap: 6 (ref 5–15)
BILIRUBIN TOTAL: 0.6 mg/dL (ref 0.3–1.2)
BUN: 19 mg/dL (ref 6–20)
CO2: 24 mmol/L (ref 22–32)
CREATININE: 0.85 mg/dL (ref 0.44–1.00)
Calcium: 8.5 mg/dL — ABNORMAL LOW (ref 8.9–10.3)
Chloride: 104 mmol/L (ref 101–111)
GFR calc Af Amer: >60 mL/min (ref >60)
GFR calc non Af Amer: >60 mL/min (ref >60)
GLUCOSE: 115 mg/dL — AB (ref 65–99)
Potassium: 3.6 mmol/L (ref 3.5–5.1)
Sodium: 134 mmol/L — ABNORMAL LOW (ref 135–145)
TOTAL PROTEIN: 6.8 g/dL (ref 6.5–8.1)

## 2015-01-27 SURGERY — CHOLECYSTECTOMY
Anesthesia: General | Site: Abdomen

## 2015-01-27 MED ORDER — HYDRALAZINE HCL 20 MG/ML IJ SOLN
5.0000 mg | Freq: Four times a day (QID) | INTRAMUSCULAR | Status: DC | PRN
  Administered 2015-01-27: 5 mg via INTRAVENOUS
  Filled 2015-01-27: qty 1

## 2015-01-27 MED ORDER — POVIDONE-IODINE 10 % EX OINT
TOPICAL_OINTMENT | CUTANEOUS | Status: AC
  Filled 2015-01-27: qty 1

## 2015-01-27 MED ORDER — HEMOSTATIC AGENTS (NO CHARGE) OPTIME
TOPICAL | Status: DC | PRN
  Administered 2015-01-27 (×2): 1 via TOPICAL

## 2015-01-27 MED ORDER — ONDANSETRON HCL 4 MG/2ML IJ SOLN: 4.0000 mg | Freq: Once | INTRAMUSCULAR | Status: DC | PRN

## 2015-01-27 MED ORDER — DEXAMETHASONE SODIUM PHOSPHATE 4 MG/ML IJ SOLN
INTRAMUSCULAR | Status: AC
  Filled 2015-01-27: qty 1

## 2015-01-27 MED ORDER — NEOSTIGMINE METHYLSULFATE 10 MG/10ML IV SOLN
INTRAVENOUS | Status: DC | PRN
  Administered 2015-01-27: 3 mg via INTRAVENOUS

## 2015-01-27 MED ORDER — FENTANYL CITRATE (PF) 250 MCG/5ML IJ SOLN
INTRAMUSCULAR | Status: DC | PRN
  Administered 2015-01-27: 50 ug via INTRAVENOUS
  Administered 2015-01-27: 25 ug via INTRAVENOUS
  Administered 2015-01-27 (×4): 50 ug via INTRAVENOUS

## 2015-01-27 MED ORDER — MIDAZOLAM HCL 2 MG/2ML IJ SOLN
INTRAMUSCULAR | Status: AC
  Filled 2015-01-27: qty 2

## 2015-01-27 MED ORDER — PROPOFOL 10 MG/ML IV BOLUS
INTRAVENOUS | Status: AC
Start: 2015-01-27 — End: 2015-01-27
  Filled 2015-01-27: qty 20

## 2015-01-27 MED ORDER — GLYCOPYRROLATE 0.2 MG/ML IJ SOLN
INTRAMUSCULAR | Status: DC | PRN
  Administered 2015-01-27: .5 mg via INTRAVENOUS

## 2015-01-27 MED ORDER — FENTANYL CITRATE (PF) 100 MCG/2ML IJ SOLN
INTRAMUSCULAR | Status: AC
  Filled 2015-01-27: qty 4

## 2015-01-27 MED ORDER — SODIUM CHLORIDE 0.9 % IR SOLN
Status: DC | PRN
  Administered 2015-01-27 (×2): 1000 mL

## 2015-01-27 MED ORDER — FENTANYL CITRATE (PF) 100 MCG/2ML IJ SOLN
25.0000 ug | INTRAMUSCULAR | Status: DC | PRN
  Administered 2015-01-27 (×3): 50 ug via INTRAVENOUS
  Filled 2015-01-27 (×2): qty 2

## 2015-01-27 MED ORDER — ROCURONIUM BROMIDE 50 MG/5ML IV SOLN
INTRAVENOUS | Status: AC
  Filled 2015-01-27: qty 3

## 2015-01-27 MED ORDER — SUCCINYLCHOLINE CHLORIDE 20 MG/ML IJ SOLN
INTRAMUSCULAR | Status: DC | PRN
  Administered 2015-01-27: 100 mg via INTRAVENOUS

## 2015-01-27 MED ORDER — HYDROMORPHONE HCL 1 MG/ML IJ SOLN
1.0000 mg | INTRAMUSCULAR | Status: DC | PRN
  Administered 2015-01-27 – 2015-01-28 (×4): 1 mg via INTRAVENOUS
  Filled 2015-01-27 (×4): qty 1

## 2015-01-27 MED ORDER — LACTATED RINGERS IV SOLN
INTRAVENOUS | Status: DC
  Administered 2015-01-28: 14:00:00 via INTRAVENOUS

## 2015-01-27 MED ORDER — CHLORHEXIDINE GLUCONATE 4 % EX LIQD: 1.0000 "application " | Freq: Once | CUTANEOUS | Status: DC

## 2015-01-27 MED ORDER — LACTATED RINGERS IV SOLN
INTRAVENOUS | Status: DC
  Administered 2015-01-27 (×2): via INTRAVENOUS

## 2015-01-27 MED ORDER — FENTANYL CITRATE (PF) 250 MCG/5ML IJ SOLN
INTRAMUSCULAR | Status: AC
Start: 2015-01-27 — End: 2015-01-27
  Filled 2015-01-27: qty 25

## 2015-01-27 MED ORDER — LORAZEPAM 2 MG/ML IJ SOLN: 0.5000 mg | INTRAMUSCULAR | Status: DC | PRN

## 2015-01-27 MED ORDER — ROCURONIUM BROMIDE 100 MG/10ML IV SOLN
INTRAVENOUS | Status: DC | PRN
  Administered 2015-01-27: 25 mg via INTRAVENOUS
  Administered 2015-01-27: 10 mg via INTRAVENOUS
  Administered 2015-01-27: 5 mg via INTRAVENOUS
  Administered 2015-01-27: 10 mg via INTRAVENOUS

## 2015-01-27 MED ORDER — MIDAZOLAM HCL 2 MG/2ML IJ SOLN
1.0000 mg | INTRAMUSCULAR | Status: DC | PRN
  Administered 2015-01-27: 2 mg via INTRAVENOUS

## 2015-01-27 MED ORDER — GLYCOPYRROLATE 0.2 MG/ML IJ SOLN
0.2000 mg | Freq: Once | INTRAMUSCULAR | Status: AC
  Administered 2015-01-27: 0.2 mg via INTRAVENOUS

## 2015-01-27 MED ORDER — GLYCOPYRROLATE 0.2 MG/ML IJ SOLN
INTRAMUSCULAR | Status: AC
  Filled 2015-01-27: qty 3

## 2015-01-27 MED ORDER — LIDOCAINE HCL (CARDIAC) 20 MG/ML IV SOLN
INTRAVENOUS | Status: DC | PRN
  Administered 2015-01-27: 40 mg via INTRAVENOUS

## 2015-01-27 MED ORDER — ONDANSETRON HCL 4 MG/2ML IJ SOLN
INTRAMUSCULAR | Status: AC
  Filled 2015-01-27: qty 2

## 2015-01-27 MED ORDER — KETOROLAC TROMETHAMINE 30 MG/ML IJ SOLN
30.0000 mg | Freq: Once | INTRAMUSCULAR | Status: AC
  Administered 2015-01-27: 30 mg via INTRAVENOUS

## 2015-01-27 MED ORDER — GLYCOPYRROLATE 0.2 MG/ML IJ SOLN
INTRAMUSCULAR | Status: AC
  Filled 2015-01-27: qty 1

## 2015-01-27 MED ORDER — BUPIVACAINE HCL (PF) 0.5 % IJ SOLN
INTRAMUSCULAR | Status: AC
  Filled 2015-01-27: qty 30

## 2015-01-27 MED ORDER — POVIDONE-IODINE 10 % OINT PACKET
TOPICAL_OINTMENT | CUTANEOUS | Status: DC | PRN
  Administered 2015-01-27: 1 via TOPICAL

## 2015-01-27 MED ORDER — KETOROLAC TROMETHAMINE 30 MG/ML IJ SOLN
INTRAMUSCULAR | Status: AC
  Filled 2015-01-27: qty 1

## 2015-01-27 MED ORDER — DEXAMETHASONE SODIUM PHOSPHATE 4 MG/ML IJ SOLN
4.0000 mg | Freq: Once | INTRAMUSCULAR | Status: AC
  Administered 2015-01-27: 4 mg via INTRAVENOUS

## 2015-01-27 MED ORDER — ONDANSETRON HCL 4 MG/2ML IJ SOLN
4.0000 mg | Freq: Once | INTRAMUSCULAR | Status: AC
  Administered 2015-01-27: 4 mg via INTRAVENOUS

## 2015-01-27 MED ORDER — BUPIVACAINE HCL (PF) 0.5 % IJ SOLN
INTRAMUSCULAR | Status: DC | PRN
  Administered 2015-01-27: 10 mL

## 2015-01-27 MED ORDER — PROPOFOL 10 MG/ML IV BOLUS
INTRAVENOUS | Status: DC | PRN
  Administered 2015-01-27: 150 mg via INTRAVENOUS

## 2015-01-27 SURGICAL SUPPLY — 58 items
APPLIER CLIP 11 MED OPEN (CLIP) ×4
APPLIER CLIP 13 LRG OPEN (CLIP) ×4
APPLIER CLIP LAPSCP 10X32 DD (CLIP) ×4 IMPLANT
BAG HAMPER (MISCELLANEOUS) ×4 IMPLANT
CHLORAPREP W/TINT 26ML (MISCELLANEOUS) ×4 IMPLANT
CLIP APPLIE 11 MED OPEN (CLIP) ×2 IMPLANT
CLIP APPLIE 13 LRG OPEN (CLIP) ×2 IMPLANT
CLOTH BEACON ORANGE TIMEOUT ST (SAFETY) ×4 IMPLANT
COVER LIGHT HANDLE STERIS (MISCELLANEOUS) ×8 IMPLANT
DECANTER SPIKE VIAL GLASS SM (MISCELLANEOUS) ×4 IMPLANT
ELECT BLADE 6 FLAT ULTRCLN (ELECTRODE) ×4 IMPLANT
ELECT REM PT RETURN 9FT ADLT (ELECTROSURGICAL) ×4
ELECTRODE REM PT RTRN 9FT ADLT (ELECTROSURGICAL) ×2 IMPLANT
EVACUATOR DRAINAGE 10X20 100CC (DRAIN) ×2 IMPLANT
EVACUATOR SILICONE 100CC (DRAIN) ×2
FILTER SMOKE EVAC LAPAROSHD (FILTER) ×4 IMPLANT
FORMALIN 10 PREFIL 120ML (MISCELLANEOUS) ×4 IMPLANT
GLOVE SURG SS PI 7.5 STRL IVOR (GLOVE) ×4 IMPLANT
GOWN STRL REUS W/ TWL XL LVL3 (GOWN DISPOSABLE) ×2 IMPLANT
GOWN STRL REUS W/TWL LRG LVL3 (GOWN DISPOSABLE) ×8 IMPLANT
GOWN STRL REUS W/TWL XL LVL3 (GOWN DISPOSABLE) ×2
HEMOSTAT SNOW SURGICEL 2X4 (HEMOSTASIS) ×4 IMPLANT
HEMOSTAT SURGICEL 4X8 (HEMOSTASIS) ×4 IMPLANT
INST SET LAPROSCOPIC AP (KITS) ×4 IMPLANT
INST SET MAJOR GENERAL (KITS) ×4 IMPLANT
IV NS IRRIG 3000ML ARTHROMATIC (IV SOLUTION) ×4 IMPLANT
KIT ROOM TURNOVER APOR (KITS) ×4 IMPLANT
MANIFOLD NEPTUNE II (INSTRUMENTS) ×4 IMPLANT
NEEDLE INSUFFLATION 14GA 120MM (NEEDLE) ×4 IMPLANT
NS IRRIG 1000ML POUR BTL (IV SOLUTION) ×8 IMPLANT
PACK LAP CHOLE LZT030E (CUSTOM PROCEDURE TRAY) ×4 IMPLANT
PAD ARMBOARD 7.5X6 YLW CONV (MISCELLANEOUS) ×4 IMPLANT
POUCH SPECIMEN RETRIEVAL 10MM (ENDOMECHANICALS) ×4 IMPLANT
SET BASIN LINEN APH (SET/KITS/TRAYS/PACK) ×4 IMPLANT
SET TUBE IRRIG SUCTION NO TIP (IRRIGATION / IRRIGATOR) ×4 IMPLANT
SLEEVE ENDOPATH XCEL 5M (ENDOMECHANICALS) ×4 IMPLANT
SPONGE DRAIN TRACH 4X4 STRL 2S (GAUZE/BANDAGES/DRESSINGS) ×4 IMPLANT
SPONGE GAUZE 2X2 8PLY STER LF (GAUZE/BANDAGES/DRESSINGS) ×4
SPONGE GAUZE 2X2 8PLY STRL LF (GAUZE/BANDAGES/DRESSINGS) ×12 IMPLANT
SPONGE GAUZE 4X4 12PLY (GAUZE/BANDAGES/DRESSINGS) ×4 IMPLANT
SPONGE INTESTINAL PEANUT (DISPOSABLE) ×8 IMPLANT
SPONGE LAP 18X18 X RAY DECT (DISPOSABLE) ×4 IMPLANT
SPONGE SURGIFOAM ABS GEL 100 (HEMOSTASIS) ×4 IMPLANT
STAPLER VISISTAT (STAPLE) ×4 IMPLANT
SUT CHROMIC BN 1/2CIR 4-0 27IN (SUTURE) ×12 IMPLANT
SUT ETHILON 3 0 FSL (SUTURE) ×4 IMPLANT
SUT VIC AB 0 CT1 27 (SUTURE) ×6
SUT VIC AB 0 CT1 27XBRD ANTBC (SUTURE) ×4 IMPLANT
SUT VIC AB 0 CT1 27XCR 8 STRN (SUTURE) ×2 IMPLANT
SUT VICRYL 0 UR6 27IN ABS (SUTURE) ×4 IMPLANT
SYR BULB IRRIGATION 50ML (SYRINGE) ×4 IMPLANT
TAPE CLOTH SURG 4X10 WHT LF (GAUZE/BANDAGES/DRESSINGS) ×4 IMPLANT
TROCAR ENDO BLADELESS 11MM (ENDOMECHANICALS) ×4 IMPLANT
TROCAR XCEL NON-BLD 5MMX100MML (ENDOMECHANICALS) ×4 IMPLANT
TROCAR XCEL UNIV SLVE 11M 100M (ENDOMECHANICALS) ×4 IMPLANT
TUBING INSUFFLATION (TUBING) ×4 IMPLANT
WARMER LAPAROSCOPE (MISCELLANEOUS) ×4 IMPLANT
YANKAUER SUCT 12FT TUBE ARGYLE (SUCTIONS) ×8 IMPLANT

## 2015-01-27 NOTE — Anesthesia Postprocedure Evaluation (Signed)
  Anesthesia Post-op Note  Patient: Sheila [name]  Procedure(s) Performed: Procedure(s) with comments: CHOLECYSTECTOMY (N/A) - converted to open at 0941  Patient Location: PACU  Anesthesia Type:General  Level of Consciousness: awake, alert  and oriented  Airway and Oxygen Therapy: Patient Spontanous Breathing and Patient connected to face mask oxygen  Post-op Pain: none  Post-op Assessment: Post-op Vital signs reviewed, Patient's Cardiovascular Status Stable, Respiratory Function Stable, Patent Airway and No signs of Nausea or vomiting              Post-op Vital Signs: Reviewed and stable  Last Vitals:  Filed Vitals:   01/27/15 1200  BP: 196/83  Pulse: 93  Temp:   Resp: 12    Complications: No apparent anesthesia complications

## 2015-01-27 NOTE — Anesthesia Procedure Notes (Signed)
Procedure Name: Intubation Date/Time: 01/27/2015 9:20 AM Performed by: Glynn Octave E Pre-anesthesia Checklist: Patient identified, Patient being monitored, Timeout performed, Emergency Drugs available and Suction available Patient Re-evaluated:Patient Re-evaluated prior to inductionOxygen Delivery Method: Circle System Utilized Preoxygenation: Pre-oxygenation with 100% oxygen Intubation Type: IV induction, Cricoid Pressure applied and Rapid sequence Ventilation: Mask ventilation without difficulty Laryngoscope Size: Mac and 3 Grade View: Grade II Tube type: Oral Tube size: 7.0 mm Number of attempts: 1 Airway Equipment and Method: Stylet Placement Confirmation: ETT inserted through vocal cords under direct vision,  positive ETCO2 and breath sounds checked- equal and bilateral Secured at: 21 cm Tube secured with: Tape Dental Injury: Teeth and Oropharynx as per pre-operative assessment

## 2015-01-27 NOTE — Transfer of Care (Signed)
Immediate Anesthesia Transfer of Care Note  Patient: Sheila [name]  Procedure(s) Performed: Procedure(s) with comments: CHOLECYSTECTOMY (N/A) - converted to open at 0941  Patient Location: PACU  Anesthesia Type:General  Level of Consciousness: awake and alert   Airway & Oxygen Therapy: Patient Spontanous Breathing and Patient connected to face mask oxygen  Post-op Assessment: Report given to RN  Post vital signs: Reviewed and stable  Last Vitals:  Filed Vitals:   01/27/15 0830  BP: 178/91  Pulse:   Temp:   Resp: 17    Complications: No apparent anesthesia complications

## 2015-01-27 NOTE — Anesthesia Preprocedure Evaluation (Signed)
Anesthesia Evaluation  Patient identified by MRN, date of birth, ID band Patient awake    Reviewed: Allergy & Precautions, NPO status , Patient's Chart, lab work & pertinent test results, reviewed documented beta blocker date and time   Airway Mallampati: III  TM Distance: >3 FB     Dental  (+) Teeth Intact, Dental Advisory Given   Pulmonary former smoker,    breath sounds clear to auscultation       Cardiovascular hypertension, Pt. on home beta blockers and Pt. on medications + CAD, + Past MI and + Cardiac Stents   Rhythm:Regular Rate:Normal     Neuro/Psych PSYCHIATRIC DISORDERS Anxiety    GI/Hepatic PUD,   Endo/Other    Renal/GU Renal disease     Musculoskeletal  (+) Arthritis ,   Abdominal   Peds  Hematology   Anesthesia Other Findings   Reproductive/Obstetrics                             Anesthesia Physical Anesthesia Plan  ASA: III  Anesthesia Plan: General   Post-op Pain Management:    Induction: Intravenous, Rapid sequence and Cricoid pressure planned  Airway Management Planned: Oral ETT  Additional Equipment:   Intra-op Plan:   Post-operative Plan: Extubation in OR  Informed Consent: I have reviewed the patients History and Physical, chart, labs and discussed the procedure including the risks, benefits and alternatives for the proposed anesthesia with the patient or authorized representative who has indicated his/her understanding and acceptance.     Plan Discussed with:   Anesthesia Plan Comments:         Anesthesia Quick Evaluation

## 2015-01-27 NOTE — Progress Notes (Signed)
Pt. Returned from OR. Responds to name call then goes back to sleep. Abdominal dressing dry and intact. JP drain intact. Family at bedside.

## 2015-01-27 NOTE — Progress Notes (Signed)
Pt BP 188/83 with a HR 78. MD made aware and ordered for 5 mg of Hydralazine. Will continue to monitor.

## 2015-01-27 NOTE — Care Management Note (Signed)
Case Management Note  Patient Details  Name: Sheila [name] MRN: [id number] Date of Birth: 07-24-1955  Subjective/Objective:                  Pt admitted with cholecystitis and abcess. Pt lives with family and will return home at discharge. Pt is independent with ADL's.  Action/Plan: No CM needs anticipated. Pt having surgery today. Anticipate discharge within 48 hours.  Expected Discharge Date:                  Expected Discharge Plan:  Home/Self Care  In-House Referral:  NA  Discharge planning Services  CM Consult  Post Acute Care Choice:  NA Choice offered to:  NA  DME Arranged:    DME Agency:     HH Arranged:    HH Agency:     Status of Service:  Completed, signed off  Medicare Important Message Given:  Yes-second notification given Date Medicare IM Given:    Medicare IM give by:    Date Additional Medicare IM Given:    Additional Medicare Important Message give by:     If discussed at Long Length of Stay Meetings, dates discussed:    Additional Comments:  Cheryl Flash, RN 01/27/2015, 8:49 AM

## 2015-01-27 NOTE — Op Note (Signed)
Patient:  Sheila [name]  DOB:  Oct 18, 1955  MRN:  [id number]   Preop Diagnosis:  Cholecystitis, cholelithiasis  Postop Diagnosis:  Same, gangrene of gallbladder  Procedure:  Open cholecystectomy  Surgeon:  Franky Macho, M.D.  Anes:  Gen. endotracheal  Indications:  Patient is a 59 year old black female who presents with cholecystitis secondary to cholelithiasis. In addition, there is some question of surrounding infections extending into the liver. The risks and benefits of the procedure including bleeding, infection, hepatobiliary injury, and the possibility of an open procedure were fully explained to the patient, who gave informed consent.  Procedure note:  The patient was placed the supine position. After induction of general endotracheal anesthesia, the abdomen was prepped and draped using the usual sterile technique with DuraPrep. Surgical site confirmation was performed.  A supraumbilical incision was made down to the fascia. A Veress needle was introduced into the abdominal cavity and confirmation of placement was done using the saline drop test. The abdomen was then insufflated to 15 mmHg pressure. An 11 mm trocar was introduced into the abdominal cavity under direct visualization without difficulty. On inspection, the liver was somewhat edematous and the gallbladder was noted to be gangrenous. Given the surrounding edema as well as the difficulty to fully visualize the gallbladder, it was elected to proceed with an open cholecystectomy. The right upper quadrant incision was made down to the peritoneum. The peritoneal cavity was entered into without difficulty while pneumoperitoneum was still present. The gallbladder was noted be diffusely gangrenous and was bluntly freed away from the liver and no retrograde dome down approach down to the infundibulum without difficulty. Multiple stones were noted within the lumen of the wall of the gallbladder as the gallbladder fell apart during dynamic  retraction. The cystic artery was ligated and divided using clips without difficulty. The cystic duct was difficult to fully identified due to the gangrenous changes down to the common bile duct. The common bile duct was fully identified. While putting clips along the proximal and distal aspects of the cystic duct, the cystic duct toward. Multiple clips were placed at the cystic duct remnant to the common bile duct in a longitudinal fashion in order to avoid any injury to the common bile duct. One small area of leakage was noted at the cystic duct and this was oversewn using a 4-0 chromic gut interrupted suture. The ureter was copiously irrigated normal saline. The common duct was fully visualized in no apparent stricture or injury was noted. A #10 flat Jackson-Pratt drain was placed into this region and brought through separate stab wound inferior to the incision line. It was secured at the skin level using 3-0 nylon interrupted suture. The hepatic bed was inspected and any bleeding was controlled using Bovie electrocautery. Surgicel and Gelfoam were placed into the hepatic bed. The posterior rectus sheath was reapproximated using a running 0 Vicryl suture. The anterior rectus sheath was reapproximated using 0 Vicryl interrupted sutures. The subcutaneous layer was irrigated normal saline and the skin was closed using staples. The supraumbilical fascia was reapproximated using an 0 Vicryl interrupted suture. The skin was closed using staples. 0.5% Sensorcaine was instilled the surrounding wound. Betadine ointment and dry sterile dressings were applied to both incisions.  All tape and needle counts were correct at the end of the procedure. The patient was extubated in the operating room and went back to recovery room awake in stable condition.  Complications:  None  EBL:  250 mL  Specimen:  Gallbladder

## 2015-01-27 NOTE — Progress Notes (Signed)
[facility] PROGRESS NOTE  Sheila [name] ZOX:[id number] DOB: 1956/01/11 DOA: 01/25/2015 PCP: [name], MD  Assessment/Plan: 1. Acute cholecystitis. General surgery performed cholecystectomy today. Operative notes indicate gangrenous gallbladder. Will continue current antibiotics. Further post op care per Gen Surg.  2. Liver abscess noted on abdominal U/S, likely related to infected adjacent gallbladder.  3. Possible UTI. Afebrile. Continue abx.  4. AKI likely related to acute infection and dehydration, improved with IVF   5. Dehydration, improving with IVFs.  6. CAD with h/o of cardiac stents in 2012. She has not had any recent chest pain or shortness of breath. CXR and EKG unremarkable. 7. HTN, stable. Continue beta-blocker 8. Systemic Lupus. She does not report being on any specific therapy. Continue to monitor   Code Status: Full DVT Prophylaxis: Lovenox Family discussion: Discussed with patient who understands and has no concerns at this time. Disposition Plan: discharge home once improved  Consultants:  General Surgery  Procedures:  Cholecystectomy 10/7  Antibiotics:  Zosyn 10/5>>  HPI/Subjective: Complains of pain in abdomen. No nausea or vomiting.  Objective: Filed Vitals:   01/27/15 0555  BP: 188/83  Pulse: 78  Temp: 99.1 F (37.3 C)  Resp: 20    Intake/Output Summary (Last 24 hours) at 01/27/15 0708 Last data filed at 01/27/15 8119  Gross per 24 hour  Intake   2130 ml  Output    100 ml  Net   2030 ml   Filed Weights   01/25/15 0534 01/25/15 0952  Weight: 65.772 kg (145 lb) 66.3 kg (146 lb 2.6 oz)    Exam:  9. General: NAD, looks comfortable 10. Cardiovascular: RRR, S1, S2  11. Respiratory: clear bilaterally, No wheezing, rales or rhonchi 12. Abdomen: diffusely tender, no distention 13. Musculoskeletal: No edema b/l  Data Reviewed: Basic Metabolic Panel:  Recent Labs Lab 01/22/15 1255 01/25/15 0630 01/26/15 0631  NA  141 134* 134*  K 4.1 3.6 3.9  CL 105 98* 104  CO2 28 21* 23  GLUCOSE 115* 145* 140*  BUN 14 21* 23*  CREATININE 0.80 1.23* 1.21*  CALCIUM 9.0 9.1 8.6*   Liver Function Tests:  Recent Labs Lab 01/22/15 1255 01/25/15 0630 01/26/15 0631  AST 24 86* 208*  ALT 18 64* 155*  ALKPHOS 121 111 100  BILITOT 0.8 0.9 0.4  PROT 7.6 8.4* 7.1  ALBUMIN 4.2 4.2 3.2*    Recent Labs Lab 01/22/15 1255 01/25/15 0630  LIPASE 28 18*   CBC:  Recent Labs Lab 01/22/15 1255 01/25/15 0630 01/26/15 0631  WBC 9.1 16.5* 16.3*  NEUTROABS  --  13.7*  --   HGB 12.3 12.3 10.0*  HCT 39.0 36.8 31.5*  MCV 95.6 93.4 94.9  PLT 338 357 358    Recent Results (from the past 240 hour(s))  Surgical pcr screen     Status: None   Collection Time: 01/26/15  1:04 PM  Result Value Ref Range Status   MRSA, PCR NEGATIVE NEGATIVE Final   Staphylococcus aureus NEGATIVE NEGATIVE Final    Comment:        The Xpert SA Assay (FDA approved for NASAL specimens in patients over 16 years of age), is one component of a comprehensive surveillance program.  Test performance has been validated by [facility] for patients greater than or equal to 61 year old. It is not intended to diagnose infection nor to guide or monitor treatment.      Studies: X-ray Chest Pa And Lateral  01/25/2015   CLINICAL DATA:  Pt states she has been having stomach pain onset for 4 days. Pt states she has been vomiting and feeling nauseous.  EXAM: CHEST  2 VIEW  COMPARISON:  03/20/2014  FINDINGS: Cardiac silhouette is borderline enlarged. No mediastinal or hilar masses or pathologically enlarged lymph nodes. There is a right coronary artery stent.  Clear lungs.  No pleural effusion or pneumothorax.  Bony thorax is intact.  IMPRESSION: No acute cardiopulmonary disease.   Electronically Signed   By: Amie Portland M.D.   On: 01/25/2015 12:40   US Abdomen Limited Ruq  01/25/2015   CLINICAL DATA:  59 year old with acute onset of upper  abdominal pain associated with nausea, vomiting and diarrhea which began 3 days ago.  EXAM: US ABDOMEN LIMITED - RIGHT UPPER QUADRANT  COMPARISON:  CT abdomen and pelvis performed earlier same date.  FINDINGS: Gallbladder:  Numerous shadowing gallstones and echogenic sludge. Gallbladder wall thickening, likely overestimated on ultrasound due to the gas within the gallbladder lumen as noted on the earlier CT. Positive sonographic Murphy sign according to the ultrasound technologist.  Common bile duct:  Diameter: Approximately 5 mm diameter. Gas within the common bile duct as noted on the CT.  Liver:  Multilocular fluid collection centrally in the liver as noted on CT measuring approximately 3.3 x 2.1 cm. No other focal hepatic parenchymal abnormality. Patent portal vein with antegrade flow.  IMPRESSION: 1. Cholelithiasis and acute cholecystitis. Gas within the gallbladder fundus and the nondistended common bile duct as noted on earlier CT, likely due to a gas producing organism. Less likely, the gas could arise due to a fistula with adjacent bowel. 2. Multilocular fluid collection centrally in the liver as noted on earlier CT, likely a liver abscess, given its proximity to the infected gallbladder.   Electronically Signed   By: Hulan Saas M.D.   On: 01/25/2015 07:59    Scheduled Meds: . metoprolol  50 mg Oral TID  . pantoprazole (PROTONIX) IV  40 mg Intravenous Q24H  . piperacillin-tazobactam (ZOSYN)  IV  3.375 g Intravenous Q8H  . pregabalin  75 mg Oral Daily   Continuous Infusions: . sodium chloride 150 mL/hr at 01/26/15 2036    Principal Problem:   Acute cholecystitis Active Problems:   History of lupus   Liver abscess   CAD (coronary artery disease)   HTN (hypertension)   AKI (acute kidney injury) (HCC)   Dehydration    Time spent: 25 minutes     Daine Gunther. MD [facility] Pager [phone]. If 7PM-7AM, please contact night-coverage at [email or web address], password  [facility] 01/27/2015, 7:03 AM  LOS: 1 day

## 2015-01-27 NOTE — Care Management Important Message (Signed)
Important Message  Patient Details  Name: Sheila [name] MRN: [id number] Date of Birth: 22-Mar-1956   Medicare Important Message Given:  Yes-second notification given    Cheryl Flash, RN 01/27/2015, 8:43 AM

## 2015-01-28 LAB — BASIC METABOLIC PANEL
Anion gap: 7 (ref 5–15)
BUN: 14 mg/dL (ref 6–20)
CHLORIDE: 104 mmol/L (ref 101–111)
CO2: 24 mmol/L (ref 22–32)
CREATININE: 0.63 mg/dL (ref 0.44–1.00)
Calcium: 8.2 mg/dL — ABNORMAL LOW (ref 8.9–10.3)
GFR calc Af Amer: >60 mL/min (ref >60)
GFR calc non Af Amer: >60 mL/min (ref >60)
Glucose, Bld: 147 mg/dL — ABNORMAL HIGH (ref 65–99)
POTASSIUM: 3.6 mmol/L (ref 3.5–5.1)
SODIUM: 135 mmol/L (ref 135–145)

## 2015-01-28 LAB — CBC
HCT: 26.4 % — ABNORMAL LOW (ref 36.0–46.0)
HEMOGLOBIN: 8.8 g/dL — AB (ref 12.0–15.0)
MCH: 30.7 pg (ref 26.0–34.0)
MCHC: 33.3 g/dL (ref 30.0–36.0)
MCV: 92 fL (ref 78.0–100.0)
Platelets: 359 10*3/uL (ref 150–400)
RBC: 2.87 MIL/uL — AB (ref 3.87–5.11)
RDW: 13.5 % (ref 11.5–15.5)
WBC: 14 10*3/uL — ABNORMAL HIGH (ref 4.0–10.5)

## 2015-01-28 LAB — PHOSPHORUS: PHOSPHORUS: 2.5 mg/dL (ref 2.5–4.6)

## 2015-01-28 LAB — HEPATIC FUNCTION PANEL
ALBUMIN: 2.5 g/dL — AB (ref 3.5–5.0)
ALK PHOS: 84 U/L (ref 38–126)
ALT: 204 U/L — ABNORMAL HIGH (ref 14–54)
AST: 170 U/L — ABNORMAL HIGH (ref 15–41)
Bilirubin, Direct: 0.1 mg/dL (ref 0.1–0.5)
Indirect Bilirubin: 0.3 mg/dL (ref 0.3–0.9)
TOTAL PROTEIN: 6.2 g/dL — AB (ref 6.5–8.1)
Total Bilirubin: 0.4 mg/dL (ref 0.3–1.2)

## 2015-01-28 LAB — MAGNESIUM: MAGNESIUM: 2.1 mg/dL (ref 1.7–2.4)

## 2015-01-28 MED ORDER — ALBUTEROL SULFATE (2.5 MG/3ML) 0.083% IN NEBU
2.5000 mg | INHALATION_SOLUTION | RESPIRATORY_TRACT | Status: DC | PRN
Start: 2015-01-28 — End: 2015-01-30

## 2015-01-28 MED ORDER — ALBUTEROL SULFATE (2.5 MG/3ML) 0.083% IN NEBU
INHALATION_SOLUTION | RESPIRATORY_TRACT | Status: AC
  Administered 2015-01-28: 2.5 mg
  Filled 2015-01-28: qty 3

## 2015-01-28 NOTE — Progress Notes (Signed)
New peripheral site started by nurse supervisor.  Midnight antibiotic given as scheduled.  Pt rested well, X-husband at bedside.

## 2015-01-28 NOTE — Progress Notes (Signed)
1 Day Post-Op  Subjective: Patient feels much better. Incisional pain under control. Is hungry.  Objective: Vital signs in last 24 hours: Temp:  [98.1 F (36.7 C)-98.7 F (37.1 C)] 98.6 F (37 C) (10/08 0706) Pulse Rate:  [69-96] 69 (10/08 0706) Resp:  [7-26] 18 (10/08 0706) BP: (137-196)/(66-91) 178/74 mmHg (10/08 0706) SpO2:  [93 %-100 %] 98 % (10/08 0706) Last BM Date: 01/26/15  Intake/Output from previous day: 10/07 0701 - 10/08 0700 In: 1740 [P.O.:240; I.V.:1500] Out: 1030 [Urine:700; Drains:130; Blood:200] Intake/Output this shift:    General appearance: alert, cooperative and no distress GI: Soft, dressings intact. JP drainage is bilious in nature.  Lab Results:   Recent Labs  01/26/15 0631 01/28/15 0610  WBC 16.3* 14.0*  HGB 10.0* 8.8*  HCT 31.5* 26.4*  PLT 358 359   BMET  Recent Labs  01/27/15 0619 01/28/15 0610  NA 134* 135  K 3.6 3.6  CL 104 104  CO2 24 24  GLUCOSE 115* 147*  BUN 19 14  CREATININE 0.85 0.63  CALCIUM 8.5* 8.2*   PT/INR No results for input(s): LABPROT, INR in the last 72 hours.  Studies/Results: No results found.  Anti-infectives: Anti-infectives    Start     Dose/Rate Route Frequency Ordered Stop   01/25/15 1600  piperacillin-tazobactam (ZOSYN) IVPB 3.375 g     3.375 g 12.5 mL/hr over 240 Minutes Intravenous Every 8 hours 01/25/15 1044     01/25/15 0845  piperacillin-tazobactam (ZOSYN) IVPB 3.375 g     3.375 g 100 mL/hr over 30 Minutes Intravenous  Once 01/25/15 0834 01/25/15 0910      Assessment/Plan: s/p Procedure(s): CHOLECYSTECTOMY Impression: Stable on postoperative day 1. A gangrenous gallbladder was found with a significant area of raw liver involved. Will monitor JP output. I have already been in contact with GI for the possibility of an ERCP with stent placement given the necrotic nature of the cystic duct. Patient and husband are aware that she may need an ERCP in the future. It is encouraging that her  liver enzyme tests are improving and her total bilirubin is normal.  Plan: Advance to heart healthy diet. Will get patient up to chair.  LOS: 3 days    Sephiroth Mcluckie A 01/28/2015

## 2015-01-28 NOTE — Addendum Note (Signed)
Addendum  created 01/28/15 0930 by Moshe Salisbury, CRNA   Modules edited: Notes Section   Notes Section:  File: [id number]

## 2015-01-28 NOTE — Anesthesia Postprocedure Evaluation (Signed)
  Anesthesia Post-op Note  Patient: Sheila [name]  Procedure(s) Performed: Procedure(s) with comments: CHOLECYSTECTOMY (N/A) - converted to open at 0941  Patient Location: Nursing Unit  Anesthesia Type:General  Level of Consciousness: awake, alert  and oriented  Airway and Oxygen Therapy: Patient Spontanous Breathing  Post-op Pain: mild  Post-op Assessment: Post-op Vital signs reviewed, Patient's Cardiovascular Status Stable, Respiratory Function Stable, Patent Airway, No signs of Nausea or vomiting and Adequate PO intake              Post-op Vital Signs: Reviewed and stable  Last Vitals:  Filed Vitals:   01/28/15 0706  BP: 178/74  Pulse: 69  Temp: 37 C  Resp: 18    Complications: No apparent anesthesia complications

## 2015-01-28 NOTE — Progress Notes (Signed)
Patient complains of wheezing, did note an occasional upper airway wheeze. Have placed a order for prn albuterol. Patient has no hx other than former smoker. She does not take inhalers at home.

## 2015-01-29 LAB — CBC
HCT: 27.4 % — ABNORMAL LOW (ref 36.0–46.0)
HEMOGLOBIN: 9 g/dL — AB (ref 12.0–15.0)
MCH: 30.1 pg (ref 26.0–34.0)
MCHC: 32.8 g/dL (ref 30.0–36.0)
MCV: 91.6 fL (ref 78.0–100.0)
Platelets: 382 10*3/uL (ref 150–400)
RBC: 2.99 MIL/uL — ABNORMAL LOW (ref 3.87–5.11)
RDW: 13.5 % (ref 11.5–15.5)
WBC: 10.8 10*3/uL — AB (ref 4.0–10.5)

## 2015-01-29 LAB — BASIC METABOLIC PANEL
ANION GAP: 8 (ref 5–15)
BUN: 11 mg/dL (ref 6–20)
CALCIUM: 8.3 mg/dL — AB (ref 8.9–10.3)
CHLORIDE: 105 mmol/L (ref 101–111)
CO2: 27 mmol/L (ref 22–32)
CREATININE: 0.68 mg/dL (ref 0.44–1.00)
GFR calc non Af Amer: >60 mL/min (ref >60)
Glucose, Bld: 121 mg/dL — ABNORMAL HIGH (ref 65–99)
Potassium: 3.1 mmol/L — ABNORMAL LOW (ref 3.5–5.1)
SODIUM: 140 mmol/L (ref 135–145)

## 2015-01-29 LAB — PHOSPHORUS: PHOSPHORUS: 2.5 mg/dL (ref 2.5–4.6)

## 2015-01-29 LAB — MAGNESIUM: MAGNESIUM: 1.9 mg/dL (ref 1.7–2.4)

## 2015-01-29 MED ORDER — CLONIDINE HCL 0.1 MG PO TABS
0.1000 mg | ORAL_TABLET | Freq: Two times a day (BID) | ORAL | Status: DC
  Administered 2015-01-29: 0.1 mg via ORAL
  Filled 2015-01-29: qty 1

## 2015-01-29 MED ORDER — INFLUENZA VAC SPLIT QUAD 0.5 ML IM SUSY: 0.5000 mL | PREFILLED_SYRINGE | INTRAMUSCULAR | Status: DC

## 2015-01-29 MED ORDER — POTASSIUM CHLORIDE 10 MEQ/100ML IV SOLN
10.0000 meq | INTRAVENOUS | Status: AC
  Administered 2015-01-29 (×4): 10 meq via INTRAVENOUS
  Filled 2015-01-29: qty 100

## 2015-01-29 MED ORDER — KCL IN DEXTROSE-NACL 40-5-0.45 MEQ/L-%-% IV SOLN
INTRAVENOUS | Status: DC
  Administered 2015-01-29: 1000 mL via INTRAVENOUS
  Administered 2015-01-30: 07:00:00 via INTRAVENOUS

## 2015-01-29 MED ORDER — CLONIDINE HCL 0.2 MG PO TABS
0.2000 mg | ORAL_TABLET | Freq: Two times a day (BID) | ORAL | Status: DC
  Administered 2015-01-29 – 2015-01-30 (×2): 0.2 mg via ORAL
  Filled 2015-01-29 (×2): qty 1

## 2015-01-29 MED ORDER — PANTOPRAZOLE SODIUM 40 MG PO TBEC
40.0000 mg | DELAYED_RELEASE_TABLET | Freq: Every day | ORAL | Status: DC
  Administered 2015-01-29 – 2015-01-30 (×2): 40 mg via ORAL
  Filled 2015-01-29 (×2): qty 1

## 2015-01-29 MED ORDER — ENALAPRILAT 1.25 MG/ML IV SOLN
2.5000 mg | Freq: Four times a day (QID) | INTRAVENOUS | Status: DC
  Administered 2015-01-29 – 2015-01-30 (×3): 2.5 mg via INTRAVENOUS
  Filled 2015-01-29 (×3): qty 2

## 2015-01-29 MED ORDER — CITALOPRAM HYDROBROMIDE 20 MG PO TABS
20.0000 mg | ORAL_TABLET | Freq: Every day | ORAL | Status: DC
  Administered 2015-01-29 – 2015-01-30 (×2): 20 mg via ORAL
  Filled 2015-01-29 (×2): qty 1

## 2015-01-29 MED ORDER — MAGNESIUM HYDROXIDE 400 MG/5ML PO SUSP
30.0000 mL | Freq: Two times a day (BID) | ORAL | Status: DC
  Administered 2015-01-29 (×2): 30 mL via ORAL
  Filled 2015-01-29 (×3): qty 30

## 2015-01-29 NOTE — Progress Notes (Signed)
   01/29/15 1900  Vitals  BP (!) 200/90 mmHg  BP Location Right Arm  BP Method Manual  Patient Position (if appropriate) Lying  Dr. Lovell Sheehan made aware new orders given and followed.

## 2015-01-29 NOTE — Plan of Care (Signed)
Problem: Phase I Progression Outcomes Goal: Pain controlled with appropriate interventions Outcome: Progressing No complaints of pain this am so far.  We are ambulating to assist with passing gas.

## 2015-01-29 NOTE — Progress Notes (Signed)
BP 202/86, MD aware. Orders to give 1000 Lopressor now and medicate for pain and to recheck vitals in 1 hour.

## 2015-01-29 NOTE — Progress Notes (Signed)
2 Days Post-Op  Subjective: Patient feels better. Has not had [name] bowel movement yet.  Objective: Vital signs in last 24 hours: Temp:  [97.6 F (36.4 C)-98.6 F (37 C)] 98.6 F (37 C) (10/09 0272) Pulse Rate:  [76-85] 78 (10/09 0608) Resp:  [18] 18 (10/09 5366) BP: (184-202)/(77-86) 202/86 mmHg (10/09 0608) SpO2:  [91 %-98 %] 98 % (10/09 4403) Last BM Date: 01/26/15  Intake/Output from previous day: 10/08 0701 - 10/09 0700 In: 1798.8 [P.O.:480; I.V.:1068.8; IV Piggyback:250] Out: 220 [Drains:220] Intake/Output this shift: Total I/O In: 120 [P.O.:120] Out: -   General appearance: alert, cooperative and no distress Resp: clear to auscultation bilaterally Cardio: regular rate and rhythm, S1, S2 normal, no murmur, click, rub or gallop GI: Soft, dressings dry and intact. JP drainage still bilious in nature.  Lab Results:   Recent Labs  01/28/15 0610 01/29/15 0604  WBC 14.0* 10.8*  HGB 8.8* 9.0*  HCT 26.4* 27.4*  PLT 359 382   BMET  Recent Labs  01/28/15 0610 01/29/15 0604  NA 135 140  K 3.6 3.1*  CL 104 105  CO2 24 27  GLUCOSE 147* 121*  BUN 14 11  CREATININE 0.63 0.68  CALCIUM 8.2* 8.3*   PT/INR No results for input(s): LABPROT, INR in the last 72 hours.  Studies/Results: No results found.  Anti-infectives: Anti-infectives    Start     Dose/Rate Route Frequency Ordered Stop   01/25/15 1600  piperacillin-tazobactam (ZOSYN) IVPB 3.375 g     3.375 g 12.5 mL/hr over 240 Minutes Intravenous Every 8 hours 01/25/15 1044     01/25/15 0845  piperacillin-tazobactam (ZOSYN) IVPB 3.375 g     3.375 g 100 mL/hr over 30 Minutes Intravenous  Once 01/25/15 0834 01/25/15 0910      Assessment/Plan: s/p Procedure(s): CHOLECYSTECTOMY Impression: Stable on postoperative day 2 for cholecystectomy secondary to gangrenous cholecystitis. JP drain controlling bilious leak. Will monitor over4 hours. Leukocytosis resolving. Hemoglobin stable. essential hypertension noted,  will add clonidine.  Hypokalemia noted. Will supplement potassium.  LOS: 4 days    Sheila [name] 01/29/2015

## 2015-01-29 NOTE — Plan of Care (Signed)
Ambulated the patient approx. 150 feet.  She was somewhat SOB on exertion but her O2 sats was 95% on room air. Voiced to her that we would walk 2 more times and as she wanted more often.  Also encourage her to get out of the bed after her bath this am.  She verbalized understanding and planned to get up.

## 2015-01-30 ENCOUNTER — Encounter (HOSPITAL_COMMUNITY): Payer: Self-pay | Admitting: General Surgery

## 2015-01-30 LAB — BASIC METABOLIC PANEL
Anion gap: 5 (ref 5–15)
BUN: 9 mg/dL (ref 6–20)
CHLORIDE: 103 mmol/L (ref 101–111)
CO2: 29 mmol/L (ref 22–32)
CREATININE: 0.64 mg/dL (ref 0.44–1.00)
Calcium: 8.2 mg/dL — ABNORMAL LOW (ref 8.9–10.3)
GFR calc Af Amer: >60 mL/min (ref >60)
GFR calc non Af Amer: >60 mL/min (ref >60)
GLUCOSE: 138 mg/dL — AB (ref 65–99)
POTASSIUM: 3.9 mmol/L (ref 3.5–5.1)
SODIUM: 137 mmol/L (ref 135–145)

## 2015-01-30 LAB — HEPATIC FUNCTION PANEL
ALK PHOS: 100 U/L (ref 38–126)
ALT: 115 U/L — AB (ref 14–54)
AST: 60 U/L — ABNORMAL HIGH (ref 15–41)
Albumin: 2.4 g/dL — ABNORMAL LOW (ref 3.5–5.0)
BILIRUBIN DIRECT: 0.2 mg/dL (ref 0.1–0.5)
BILIRUBIN INDIRECT: 0.4 mg/dL (ref 0.3–0.9)
BILIRUBIN TOTAL: 0.6 mg/dL (ref 0.3–1.2)
TOTAL PROTEIN: 6.2 g/dL — AB (ref 6.5–8.1)

## 2015-01-30 LAB — URINE CULTURE: Culture: 60000

## 2015-01-30 LAB — CBC
HCT: 26.9 % — ABNORMAL LOW (ref 36.0–46.0)
HEMOGLOBIN: 8.9 g/dL — AB (ref 12.0–15.0)
MCH: 30.4 pg (ref 26.0–34.0)
MCHC: 33.1 g/dL (ref 30.0–36.0)
MCV: 91.8 fL (ref 78.0–100.0)
Platelets: 435 10*3/uL — ABNORMAL HIGH (ref 150–400)
RBC: 2.93 MIL/uL — ABNORMAL LOW (ref 3.87–5.11)
RDW: 13.9 % (ref 11.5–15.5)
WBC: 11 10*3/uL — ABNORMAL HIGH (ref 4.0–10.5)

## 2015-01-30 MED ORDER — CLONIDINE HCL 0.2 MG PO TABS
0.2000 mg | ORAL_TABLET | Freq: Two times a day (BID) | ORAL | Status: DC
Start: 2015-01-30 — End: 2015-03-07

## 2015-01-30 MED ORDER — AMOXICILLIN-POT CLAVULANATE 875-125 MG PO TABS: 1.0000 | ORAL_TABLET | Freq: Two times a day (BID) | ORAL | Status: DC

## 2015-01-30 MED ORDER — OXYCODONE-ACETAMINOPHEN 7.5-325 MG PO TABS: 1.0000 | ORAL_TABLET | ORAL | Status: DC | PRN

## 2015-01-30 NOTE — Discharge Instructions (Signed)
Bulb Drain Home Care A bulb drain consists of a thin rubber tube and a soft, round bulb that creates a gentle suction. The rubber tube is placed in the area where you had surgery. A bulb is attached to the end of the tube that is outside the body. The bulb drain removes excess fluid that normally builds up in a surgical wound after surgery. The color and amount of fluid will vary. Immediately after surgery, the fluid is bright red and is a little thicker than water. It may gradually change to a yellow or pink color and become more thin and water-like. When the amount decreases to about 1 or 2 tbsp in 24 hours, your health care provider will usually remove it. DAILY CARE  Keep the bulb flat (compressed) at all times, except while emptying it. The flatness creates suction. You can flatten the bulb by squeezing it firmly in the middle and then closing the cap.  Keep sites where the tube enters the skin dry and covered with a bandage (dressing).  Secure the tube 1-2 in (2.5-5.1 cm) below the insertion sites to keep it from pulling on your stitches. The tube is stitched in place and will not slip out.  Secure the bulb as directed by your health care provider.  For the first 3 days after surgery, there usually is more fluid in the bulb. Empty the bulb whenever it becomes half full because the bulb does not create enough suction if it is too full. The bulb could also overflow. Write down how much fluid you remove each time you empty your drain. Add up the amount removed in 24 hours.  Empty the bulb at the same time every day once the amount of fluid decreases and you only need to empty it once a day. Write down the amounts and the 24-hour totals to give to your health care provider. This helps your health care provider know when the tubes can be removed. EMPTYING THE BULB DRAIN Before emptying the bulb, get a measuring cup, a piece of paper and a pen, and wash your hands.  Gently run your fingers down the  tube (stripping) to empty any drainage from the tubing into the bulb. This may need to be done several times a day to clear the tubing of clots and tissue.  Open the bulb cap to release suction, which causes it to inflate. Do not touch the inside of the cap.  Gently run your fingers down the tube (stripping) to empty any drainage from the tubing into the bulb.  Hold the cap out of the way, and pour fluid into the measuring cup.   Squeeze the bulb to provide suction.  Replace the cap.   Check the tape that holds the tube to your skin. If it is becoming loose, you can remove the loose piece of tape and apply a new one. Then, pin the bulb to your shirt.   Write down the amount of fluid you emptied out. Write down the date and each time you emptied your bulb drain. (If there are 2 bulbs, note the amount of drainage from each bulb and keep the totals separate. Your health care provider will want to know the total amounts for each drain and which tube is draining more.)   Flush the fluid down the toilet and wash your hands.   Call your health care provider once you have less than 2 tbsp of fluid collecting in the bulb drain every 24 hours. If  there is drainage around the tube site, change dressings and keep the area dry. Cleanse around tube with sterile saline and place dry gauze around site. This gauze should be changed when it is soiled. If it stays clean and unsoiled, it should still be changed daily.  SEEK MEDICAL CARE IF:  Your drainage has a bad smell or is cloudy.   You have a fever.   Your drainage is increasing instead of decreasing.   Your tube fell out.   You have redness or swelling around the tube site.   You have drainage from a surgical wound.   Your bulb drain will not stay flat after you empty it.  MAKE SURE YOU:   Understand these instructions.  Will watch your condition.  Will get help right away if you are not doing well or get worse.   This  information is not intended to replace advice given to you by your health care provider. Make sure you discuss any questions you have with your health care provider.   Document Released: 04/05/2000 Document Revised: 04/29/2014 Document Reviewed: 10/26/2014 Elsevier Interactive Patient Education 2016 [employer] Open Cholecystectomy, Care After Refer to this sheet in the next few weeks. These instructions provide you with information on caring for yourself after your procedure. Your health care provider may also give you more specific instructions. Your treatment has been planned according to current medical practices, but problems sometimes occur. Call your health care provider if you have any problems or questions after your procedure. WHAT TO EXPECT AFTER THE PROCEDURE After your procedure, it is typical to have the following:  Pain at your incision site. You will be given medicines to control this pain.  Constipation. You may be given stool softeners to help prevent this. HOME CARE INSTRUCTIONS   Change bandages (dressings) as directed by your health care provider.  Keep the wound dry and clean. You may wash the wound gently with soap and water. Gently blot or dab the wound dry.  It is okay to take showers 48 hours after surgery. Do not take baths or use swimming pools or hot tubs for 2 weeks or until your health care provider approves.  Only take over-the-counter or prescription medicines as directed by your health care provider.  Continue your normal diet as directed by your health care provider. Eat plenty of fruits and vegetables to help prevent constipation.  Drink enough fluids to keep your urine clear or pale yellow. This also helps prevent constipation.  Do not lift anything heavier than 10 pounds (4.5 kg) for 1 month or until your health care provider approves.  Do not play contact sports for 1 month or until your health care provider approves.  Do not return to work or  school for 4 weeks or until your health care provider approves. SEEK MEDICAL CARE IF:   You have redness, swelling, or increasing pain in the wound.  You see a yellowish-white fluid (pus) coming from the wound.  You have drainage from the wound that lasts longer than 1 day.  You notice a bad smell coming from the wound or dressing.  Your wound breaks open after the stitches (sutures) or staples have been removed.  You have increasing pain in the shoulders (shoulder strap areas).  You have dizzy episodes or faint while standing.  You feel sick to your stomach (nauseous) or throw up (vomit). SEEK IMMEDIATE MEDICAL CARE IF:   You have a fever.  You develop a rash.  You  have difficulty breathing.  You have chest pain.  You have severe abdominal pain.  You have leg pain.   This information is not intended to replace advice given to you by your health care provider. Make sure you discuss any questions you have with your health care provider.   Document Released: 07/25/2003 Document Revised: 01/27/2013 Document Reviewed: 11/18/2012 Elsevier Interactive Patient Education [employer]

## 2015-01-30 NOTE — Discharge Summary (Signed)
Physician Discharge Summary  Patient ID: Dosha Broshears MRN: [id number] DOB/AGE: 1955/10/10 59 y.o.  Admit date: 01/25/2015 Discharge date: 01/30/2015  Admission Diagnoses: Acute cholecystitis, cholelithiasis  Discharge Diagnoses: Same Principal Problem:   Acute cholecystitis Active Problems:   History of lupus   Liver abscess   CAD (coronary artery disease)   HTN (hypertension)   AKI (acute kidney injury) (HCC)   Dehydration   Discharged Condition: good  Hospital Course: Patient is a 59 year old black female who presented emergency room with worsening right upper quadrant abdominal pain, nausea, and vomiting. CT scan of the abdomen revealed acute cholecystitis with surrounding liver inflammation as well as a small amount gas within the hepatobiliary tree. She was diagnosed with acute cholecystitis with cholelithiasis. She was admitted by the hospitalist for further evaluation treatment. Surgery consultation was obtained and she subsequently underwent an open cholecystectomy on 01/27/2015. She was found to have a gangrenous gallbladder and empyema. A drain was placed in the subhepatic space due to the necrotic nature of the cystic duct. The liver bed was also noted to be somewhat necrotic due to the gallbladder findings. Her postoperative course was remarkable for hypertension, which clonidine was added to her antihypertensives regimen. Her liver enzyme tests have improved. Her JP drainage has been bilious, but decreasing over the past 24 hours. Her leukocytosis has been normalizing. Her diet was advanced. She is being discharged home with her JP drain in good and improving condition. She has been told that she may need an ERCP in the future should she continue to have bilious drainage.  Treatments: surgery: Open cholecystectomy on 01/27/2015  Discharge Exam: Blood pressure 160/88, pulse 70, temperature 99 F (37.2 C), temperature source Oral, resp. rate 18, height  (1.549 m), weight  66.3 kg (146 lb 2.6 oz), SpO2 97 %. General appearance: alert, cooperative and no distress Resp: clear to auscultation bilaterally Cardio: regular rate and rhythm, S1, S2 normal, no murmur, click, rub or gallop GI: Soft, incision healing well. JP drainage bilious but smaller in amount. Bowel sounds appreciated.  Disposition: 01-Home or Self Care     Medication List    STOP taking these medications        cephALEXin 500 MG capsule  Commonly known as:  KEFLEX     oxyCODONE-acetaminophen 5-325 MG tablet  Commonly known as:  PERCOCET/ROXICET  Replaced by:  oxyCODONE-acetaminophen 7.5-325 MG tablet      TAKE these medications        ADVIL PM PO  Take 2-3 tablets by mouth at bedtime.     amoxicillin-clavulanate 875-125 MG tablet  Commonly known as:  AUGMENTIN  Take 1 tablet by mouth 2 (two) times daily.     cloNIDine 0.2 MG tablet  Commonly known as:  CATAPRES  Take 1 tablet (0.2 mg total) by mouth 2 (two) times daily.     docusate sodium 100 MG capsule  Commonly known as:  COLACE  Take 100 mg by mouth 2 (two) times daily.     escitalopram 10 MG tablet  Commonly known as:  LEXAPRO  Take 10 mg by mouth daily.     HAIR/SKIN/NAILS PO  Take 1 tablet by mouth daily.     metoprolol 50 MG tablet  Commonly known as:  LOPRESSOR  Take 50 mg by mouth 3 (three) times daily.     omeprazole 40 MG capsule  Commonly known as:  PRILOSEC  Take 40 mg by mouth daily.     ondansetron 8 MG tablet  Commonly known as:  ZOFRAN  Take 1 tablet (8 mg total) by mouth every 4 (four) hours as needed.     ONE-A-DAY WOMENS FORMULA PO  Take 1 tablet by mouth daily.     oxyCODONE-acetaminophen 7.5-325 MG tablet  Commonly known as:  PERCOCET  Take 1-2 tablets by mouth every 4 (four) hours as needed.     pregabalin 75 MG capsule  Commonly known as:  LYRICA  Take 75 mg by mouth daily.           Follow-up Information    Follow up with Dalia Heading, MD. Schedule an appointment as soon  as possible for a visit on 02/07/2015.   Specialty:  General Surgery   Contact information:   [address] [place] [address] [phone]       Signed: Franky Macho A 01/30/2015, 9:11 AM

## 2015-01-30 NOTE — Care Management Important Message (Signed)
Important Message  Patient Details  Name: Sheila [name] MRN: [id number] Date of Birth: September 24, 1955   Medicare Important Message Given:  Yes-third notification given    Cheryl Flash, RN 01/30/2015, 10:04 AM

## 2015-01-30 NOTE — Care Management Note (Signed)
Case Management Note  Patient Details  Name: Dannie Woolen MRN: [id number] Date of Birth: 22-Feb-1956  Subjective/Objective:                    Action/Plan:   Expected Discharge Date:                  Expected Discharge Plan:  Home/Self Care  In-House Referral:  NA  Discharge planning Services  CM Consult  Post Acute Care Choice:  NA Choice offered to:  NA  DME Arranged:    DME Agency:     HH Arranged:    HH Agency:     Status of Service:  Completed, signed off  Medicare Important Message Given:  Yes-third notification given Date Medicare IM Given:    Medicare IM give by:    Date Additional Medicare IM Given:    Additional Medicare Important Message give by:     If discussed at Long Length of Stay Meetings, dates discussed:    Additional Comments: Pt discharged home today with her husband and JP drain in place. Pt refuses any home health at this time. Pt feels if staff teaches her how to care for JP, it can be managed by the pt. Pt and pts nurse aware of discharge arrangements. Arlyss Queen Severn, RN 01/30/2015, 10:10 AM

## 2015-01-30 NOTE — Final Progress Note (Signed)
Patient discharged with instructions, prescription, and care notes.  Verbalized understanding via teach back.  IV was removed and the site was WNL. Patient voiced no further complaints or concerns at the time of discharge.  Appointments scheduled per instructions.  Patient left the floor via w/c with staff and family in stable condition.  Prior to discharge I explained and demonstrated how to empty and charge the JP drain.  The demonstrated understanding AEB return demonstration and also voiced how to read the measuring to be used.

## 2015-02-07 ENCOUNTER — Encounter (HOSPITAL_COMMUNITY): Payer: Self-pay | Admitting: Emergency Medicine

## 2015-02-07 ENCOUNTER — Encounter: Payer: Self-pay | Admitting: Gastroenterology

## 2015-02-07 ENCOUNTER — Emergency Department (HOSPITAL_COMMUNITY)
Admission: EM | Admit: 2015-02-07 | Discharge: 2015-02-07 | Disposition: A | Discharge status: Home / Self Care | Payer: Medicare Other | Attending: Emergency Medicine | Admitting: Emergency Medicine

## 2015-02-07 ENCOUNTER — Other Ambulatory Visit: Payer: Self-pay

## 2015-02-07 ENCOUNTER — Ambulatory Visit (INDEPENDENT_AMBULATORY_CARE_PROVIDER_SITE_OTHER): Payer: Medicare Other | Admitting: Gastroenterology

## 2015-02-07 VITALS — BP 126/67 | HR 59 | Temp 97.1°F | Ht 61.0 in | Wt 141.2 lb

## 2015-02-07 DIAGNOSIS — K838 Other specified diseases of biliary tract: Secondary | ICD-10-CM

## 2015-02-07 DIAGNOSIS — T8189XA Other complications of procedures, not elsewhere classified, initial encounter: Secondary | ICD-10-CM | POA: Diagnosis not present

## 2015-02-07 DIAGNOSIS — T8131XA Disruption of external operation (surgical) wound, not elsewhere classified, initial encounter: Secondary | ICD-10-CM | POA: Diagnosis not present

## 2015-02-07 DIAGNOSIS — Z8711 Personal history of peptic ulcer disease: Secondary | ICD-10-CM | POA: Insufficient documentation

## 2015-02-07 DIAGNOSIS — Y832 Surgical operation with anastomosis, bypass or graft as the cause of abnormal reaction of the patient, or of later complication, without mention of misadventure at the time of the procedure: Secondary | ICD-10-CM

## 2015-02-07 DIAGNOSIS — K9189 Other postprocedural complications and disorders of digestive system: Secondary | ICD-10-CM | POA: Insufficient documentation

## 2015-02-07 DIAGNOSIS — M1990 Unspecified osteoarthritis, unspecified site: Secondary | ICD-10-CM | POA: Insufficient documentation

## 2015-02-07 DIAGNOSIS — I252 Old myocardial infarction: Secondary | ICD-10-CM | POA: Insufficient documentation

## 2015-02-07 DIAGNOSIS — I2510 Atherosclerotic heart disease of native coronary artery without angina pectoris: Secondary | ICD-10-CM | POA: Diagnosis not present

## 2015-02-07 DIAGNOSIS — Z9049 Acquired absence of other specified parts of digestive tract: Secondary | ICD-10-CM | POA: Diagnosis not present

## 2015-02-07 DIAGNOSIS — Y658 Other specified misadventures during surgical and medical care: Secondary | ICD-10-CM | POA: Diagnosis not present

## 2015-02-07 DIAGNOSIS — Z9861 Coronary angioplasty status: Secondary | ICD-10-CM | POA: Insufficient documentation

## 2015-02-07 DIAGNOSIS — F419 Anxiety disorder, unspecified: Secondary | ICD-10-CM | POA: Insufficient documentation

## 2015-02-07 DIAGNOSIS — Z79899 Other long term (current) drug therapy: Secondary | ICD-10-CM | POA: Insufficient documentation

## 2015-02-07 DIAGNOSIS — Z87891 Personal history of nicotine dependence: Secondary | ICD-10-CM | POA: Insufficient documentation

## 2015-02-07 DIAGNOSIS — K929 Disease of digestive system, unspecified: Secondary | ICD-10-CM | POA: Diagnosis not present

## 2015-02-07 DIAGNOSIS — I10 Essential (primary) hypertension: Secondary | ICD-10-CM | POA: Diagnosis not present

## 2015-02-07 LAB — CBC WITH DIFFERENTIAL/PLATELET
BASOS ABS: 0 10*3/uL (ref 0.0–0.1)
Basophils Relative: 1 %
EOS PCT: 2 %
Eosinophils Absolute: 0.1 10*3/uL (ref 0.0–0.7)
HEMATOCRIT: 29.9 % — AB (ref 36.0–46.0)
Hemoglobin: 9.5 g/dL — ABNORMAL LOW (ref 12.0–15.0)
LYMPHS ABS: 2.2 10*3/uL (ref 0.7–4.0)
LYMPHS PCT: 34 %
MCH: 29.6 pg (ref 26.0–34.0)
MCHC: 31.8 g/dL (ref 30.0–36.0)
MCV: 93.1 fL (ref 78.0–100.0)
MONO ABS: 0.5 10*3/uL (ref 0.1–1.0)
Monocytes Relative: 8 %
NEUTROS ABS: 3.5 10*3/uL (ref 1.7–7.7)
Neutrophils Relative %: 55 %
PLATELETS: 586 10*3/uL — AB (ref 150–400)
RBC: 3.21 MIL/uL — AB (ref 3.87–5.11)
RDW: 13.4 % (ref 11.5–15.5)
WBC: 6.3 10*3/uL (ref 4.0–10.5)

## 2015-02-07 LAB — URINALYSIS, ROUTINE W REFLEX MICROSCOPIC
BILIRUBIN URINE: NEGATIVE
GLUCOSE, UA: NEGATIVE mg/dL
KETONES UR: NEGATIVE mg/dL
Leukocytes, UA: NEGATIVE
Nitrite: NEGATIVE
PH: 6 (ref 5.0–8.0)
PROTEIN: 30 mg/dL — AB
Specific Gravity, Urine: 1.025 (ref 1.005–1.030)
Urobilinogen, UA: 0.2 mg/dL (ref 0.0–1.0)

## 2015-02-07 LAB — COMPREHENSIVE METABOLIC PANEL
ALT: 26 U/L (ref 14–54)
AST: 16 U/L (ref 15–41)
Albumin: 3.3 g/dL — ABNORMAL LOW (ref 3.5–5.0)
Alkaline Phosphatase: 83 U/L (ref 38–126)
Anion gap: 7 (ref 5–15)
BILIRUBIN TOTAL: 0.3 mg/dL (ref 0.3–1.2)
BUN: 11 mg/dL (ref 6–20)
CALCIUM: 9.2 mg/dL (ref 8.9–10.3)
CO2: 26 mmol/L (ref 22–32)
CREATININE: 0.77 mg/dL (ref 0.44–1.00)
Chloride: 105 mmol/L (ref 101–111)
Glucose, Bld: 104 mg/dL — ABNORMAL HIGH (ref 65–99)
Potassium: 4.4 mmol/L (ref 3.5–5.1)
Sodium: 138 mmol/L (ref 135–145)
TOTAL PROTEIN: 7.2 g/dL (ref 6.5–8.1)

## 2015-02-07 LAB — URINE MICROSCOPIC-ADD ON

## 2015-02-07 LAB — LIPASE, BLOOD: LIPASE: 26 U/L (ref 22–51)

## 2015-02-07 NOTE — Patient Instructions (Signed)
1. ERCP with stent placement tomorrow. See separate instructions.

## 2015-02-07 NOTE — ED Notes (Signed)
Staples noted to right upper and mid upper abdomen with JP drainage to right abdomen with bulb suction.  Bilious drainage noted to bulb.  C/o  Light yellow drainage from right lateral incision.  All staples to incisions are intact. No redness noted.  Denies any fever at this time.

## 2015-02-07 NOTE — Assessment & Plan Note (Signed)
59 y/o female status post open cholecystectomy on 01/27/2015 for acute cholecystitis with cholelithiasis. Gangrenous gallbladder noted. JP drained placed. Also noted to have empyema. Presents now with ongoing biliousoutput via JP drain 11 days postop. Patient will require ERCP with biliary stenting. The risks, benefits, limitations, alternatives, and imponderables have been reviewed with the patient. I specifically discussed a 1 in 10 chance of pancreatitis, reaction to medications, bleeding, perforation and the possibility of a failed ERCP. Need for sphincterotomy and stent placement also reviewed. Questions have been answered. All parties agreeable. Plan for procedure for tomorrow.   She will require ERCP with stent removal in 6-8 weeks, patient aware.   Patient with some drainage at site of the open cholecystectomy incision, staples removed today by Dr. Lovell SheehanJenkins. Dressing applied. No antibiotics advised at this time.  Questionable liver abscess via CT, empyema noted intraoperatively per Dr. Lovell SheehanJenkins. ERCP at this time. Cannot rule out need for further imaging down the road.

## 2015-02-07 NOTE — ED Notes (Signed)
MD at bedside. 

## 2015-02-07 NOTE — Discharge Instructions (Signed)
Go directly to Dr. Lovell SheehanJenkins office from the ED.  Return to the ED if you develop new or worsening symptoms.

## 2015-02-07 NOTE — Progress Notes (Signed)
Primary Care Physician:  [name], MD  Primary Gastroenterologist:  Roetta Sessions, MD   Chief Complaint  Patient presents with  . bile leak    HPI:  Sheila [name] is a 59 y.o. female here for urgent office visit to schedule ERCP with stent placement for biliary leak. Patient underwent open cholecystectomy on 01/27/2015 for acute cholecystitis with cholelithiasis. CT scan showed distended gallbladder with gallbladder wall thickening and mild cholecystic edema, gas in the gallbladder lumen as well as in the cystic and common bile ducts. 33 mm multi locular cyst in the porta hepatis, question biliary cystadenoma or septated hepatic cyst.abdominal ultrasound suggested multilocular fluid collection centrally in the liver, likely a liver abscess given proximity to the infected gallbladder.at time of surgery she was found to have a gangrenous gallbladder and Empyema. Drain was placed in the subhepatic space due to necrotic nature of the cystic duct. Liver bed was also noted to be somewhat necrotic do the gallbladder findings. It was felt that she would likely need an ERCP in the near future.  Patient went to the emergency department today with complaints of drainage from her incision. Labs obtained as outlined below. Dr. Lovell Sheehan saw her in the office today and noted that she had significant ongoing bilious output and her JP drain. Patient emptying every 2-3 hours. Arrangements were made for patient to be seen to coordinate ERCP with biliary stent placement.  Completed Augmentin post-operatively. States that she has ongoing fatigue and anorexia. Consuming basically liquid diet.Some nausea but no vomiting. Good bowel movement on Sunday. Yesterday her urine was dark in color but cleared up with increased fluid intake. Continues to have right upper quadrant abdominal pain, taking pain medication regularly. Denies heartburn. No melena or rectal bleeding. Continues clonidine which was started during her  hospitalization for ongoing hypertension.  No ASA/Plavix.  Patient reports EGD earlier this year in [place], around February, with findings consistent with peptic ulcer disease. No records in [facility]. Aspirin stopped at that time. Last colonoscopy around 2008, normal.    Current Outpatient Prescriptions  Medication Sig Dispense Refill  . cloNIDine (CATAPRES) 0.2 MG tablet Take 1 tablet (0.2 mg total) by mouth 2 (two) times daily. 60 tablet 1  . docusate sodium (COLACE) 100 MG capsule Take 100 mg by mouth 2 (two) times daily.    Marland Kitchen escitalopram (LEXAPRO) 10 MG tablet Take 10 mg by mouth daily.    Marland Kitchen ibuprofen (ADVIL,MOTRIN) 800 MG tablet Take 800 mg by mouth every 8 (eight) hours as needed.    . Ibuprofen-Diphenhydramine Cit (ADVIL PM PO) Take 2-3 tablets by mouth at bedtime.    . metoprolol (LOPRESSOR) 50 MG tablet Take 50 mg by mouth 3 (three) times daily.    . Multiple Vitamins-Calcium (ONE-A-DAY WOMENS FORMULA PO) Take 1 tablet by mouth daily.    . Multiple Vitamins-Minerals (HAIR/SKIN/NAILS PO) Take 1 tablet by mouth daily.    Marland Kitchen omeprazole (PRILOSEC) 40 MG capsule Take 40 mg by mouth daily.    . ondansetron (ZOFRAN) 8 MG tablet Take 1 tablet (8 mg total) by mouth every 4 (four) hours as needed. 10 tablet 0  . oxyCODONE-acetaminophen (PERCOCET) 7.5-325 MG tablet Take 1-2 tablets by mouth every 4 (four) hours as needed. 50 tablet 0  . pregabalin (LYRICA) 75 MG capsule Take 75 mg by mouth daily.     No current facility-administered medications for this visit.    Allergies as of 02/07/2015  . (No Known Allergies)    Past Medical History  Diagnosis Date  . Myocardial infarction (HCC) 2012   . CVD (cardiovascular disease)     Stent to mid RCA 2012 [name] 2.5 X 12 mm  . Systemic lupus (HCC)   . Anxiety   . Hypertension   . Arthritis   . Hypercholesterolemia   . PUD (peptic ulcer disease)     Dr. in [place] in [place]    Past Surgical History  Procedure Laterality Date  .  Coronary angioplasty with stent placement  2012, 2013.    [name] DES to mid RCA. Stents X 2 to unknown artery  . Abdominal hysterectomy    . Tubal ligation    . Breast lumpectomy    . Cholecystectomy N/A 01/27/2015    Procedure: CHOLECYSTECTOMY;  Surgeon: Franky Macho, MD;  Location: [facility];  Service: General;  Laterality: N/A;  converted to open at 0941  . Esophagogastroduodenoscopy      [place]:PUD per patient  . Colonoscopy  2008    [facility], normal.     Family History  Problem Relation Age of Onset  . CAD Father     CABG  . Heart attack Brother     PTCA  . Hypertension Sister   . Hypertension Father   . Colon cancer Neg Hx   . Liver disease Neg Hx     Social History   Social History  . Marital Status: Legally Separated    Spouse Name: N/A  . Number of Children: 2  . Years of Education: N/A   Occupational History  . Nutritionist    Social History Main Topics  . Smoking status: Former Smoker    Types: Cigarettes  . Smokeless tobacco: Not on file  . Alcohol Use: No  . Drug Use: No  . Sexual Activity: No   Other Topics Concern  . Not on file   Social History Narrative   Lives alone. Moved from [place] 2 years ago.       ROS:  General: Negative for  weight loss, fever, chills. See hpi. Eyes: Negative for vision changes.  ENT: Negative for hoarseness, difficulty swallowing , nasal congestion. CV: Negative for chest pain, angina, palpitations, dyspnea on exertion, peripheral edema.  Respiratory: Negative for dyspnea at rest, dyspnea on exertion, cough, sputum, wheezing.  GI: See history of present illness. GU:  Negative for dysuria, hematuria, urinary incontinence, urinary frequency, nocturnal urination.  MS: Negative for joint pain, low back pain.  Derm: Negative for rash or itching.  Neuro: Negative for weakness, abnormal sensation, seizure, frequent headaches, memory loss, confusion.  Psych: Negative for anxiety, depression, suicidal ideation,  hallucinations.  Endo: Negative for unusual weight change.  Heme: Negative for bruising or bleeding. Allergy: Negative for rash or hives.    Physical Examination:  BP 126/67 mmHg  Pulse 59  Temp(Src) 97.1 F (36.2 C) (Oral)  Ht  (1.549 m)  Wt 141 lb 3.2 oz (64.048 kg)  BMI 26.69 kg/m2   General: Well-nourished, well-developed in no acute distress.  Head: Normocephalic, atraumatic.   Eyes: Conjunctiva pink, no icterus. Mouth: Oropharyngeal mucosa moist and pink , no lesions erythema or exudate. Neck: Supple without thyromegaly, masses, or lymphadenopathy.  Lungs: Clear to auscultation bilaterally.  Heart: Regular rate and rhythm, no murmurs rubs or gallops.  Abdomen: Bowel sounds are normal, mild tenderness in ruq, nondistended, no hepatosplenomegaly or masses, no abdominal bruits or    hernia , no rebound or guarding.  Incision with banding on right side with some drainage noted. JP  with bilious drainage Rectal: not performed Extremities: No lower extremity edema. No clubbing or deformities.  Neuro: Alert and oriented x 4 , grossly normal neurologically.  Skin: Warm and dry, no rash or jaundice.   Psych: Alert and cooperative, normal mood and affect.  Labs: Lab Results  Component Value Date   WBC 6.3 02/07/2015   HGB 9.5* 02/07/2015   HCT 29.9* 02/07/2015   MCV 93.1 02/07/2015   PLT 586* 02/07/2015   Lab Results  Component Value Date   CREATININE 0.77 02/07/2015   BUN 11 02/07/2015   NA 138 02/07/2015   K 4.4 02/07/2015   CL 105 02/07/2015   CO2 26 02/07/2015   Lab Results  Component Value Date   ALT 26 02/07/2015   AST 16 02/07/2015   ALKPHOS 83 02/07/2015   BILITOT 0.3 02/07/2015   Lab Results  Component Value Date   LIPASE 26 02/07/2015   No results found for: INR, PROTIME   Imaging Studies: X-ray Chest Pa And Lateral  01/25/2015  CLINICAL DATA:  Pt states she has been having stomach pain onset for 4 days. Pt states she has been vomiting and  feeling nauseous. EXAM: CHEST  2 VIEW COMPARISON:  03/20/2014 FINDINGS: Cardiac silhouette is borderline enlarged. No mediastinal or hilar masses or pathologically enlarged lymph nodes. There is a right coronary artery stent. Clear lungs.  No pleural effusion or pneumothorax. Bony thorax is intact. IMPRESSION: No acute cardiopulmonary disease. Electronically Signed   By: Amie Portland M.D.   On: 01/25/2015 12:40   Ct Abdomen Pelvis W Contrast  01/25/2015  CLINICAL DATA:  Diffuse abdominal pain.  Fever and diarrhea EXAM: CT ABDOMEN AND PELVIS WITH CONTRAST TECHNIQUE: Multidetector CT imaging of the abdomen and pelvis was performed using the standard protocol following bolus administration of intravenous contrast. CONTRAST:  50mL OMNIPAQUE IOHEXOL 300 MG/ML SOLN, OMNIPAQUE IOHEXOL 300 MG/ML SOLN COMPARISON:  None. FINDINGS: Lower chest: Minimal bibasilar atelectasis. No infiltrate or effusion in the lung bases. Heart size upper normal. Hepatobiliary: Gallbladder is distended with gallbladder wall thickening. Mild stranding in the pericholecystic fat. There is a small gas bubble in the fundus of the gallbladder. Multiple calcified gallstones are present. There is gas in the cystic duct and common bile duct. The common bile duct is nondilated. Anterior to the cystic duct in the porta hepatis there is a multilocular cyst measuring 33 mm in diameter. No additional liver lesions identified. No free air no free fluid. Pancreas: Normal pancreas. Negative for pancreatitis or pancreatic mass. Spleen: Negative Adrenals/Urinary Tract: Negative for renal obstruction. No renal mass or renal calculi. Urinary bladder normal. Stomach/Bowel: Negative for bowel obstruction. Small hiatal hernia. Negative for bowel edema. Appendix not visualized. Vascular/Lymphatic: Atherosclerotic calcification in the aorta and iliac arteries. Negative for aortic aneurysm. Reproductive: Hysterectomy.  Negative for pelvic mass. Other: No free  fluid.  No free air.  Negative for adenopathy. Musculoskeletal: Minimal lumbar degenerative change. No acute skeletal abnormality. IMPRESSION: Cholelithiasis. Distended gallbladder with gallbladder wall thickening and mild pericholecystic edema. Findings suggest acute cholecystitis. In addition, there is gas in the gallbladder lumen as well as in the cystic duct and common bile duct compatible with cholecystitis. 33 mm multilocular cyst in the porta hepatis. This may represent a biliary cystadenoma or septated hepatic cyst. Appendix not visualized. Electronically Signed   By: Marlan Palau M.D.   On: 01/25/2015 07:17   US Abdomen Limited Ruq  01/25/2015  CLINICAL DATA:  59 year old with acute onset of upper  abdominal pain associated with nausea, vomiting and diarrhea which began 3 days ago. EXAM: US ABDOMEN LIMITED - RIGHT UPPER QUADRANT COMPARISON:  CT abdomen and pelvis performed earlier same date. FINDINGS: Gallbladder: Numerous shadowing gallstones and echogenic sludge. Gallbladder wall thickening, likely overestimated on ultrasound due to the gas within the gallbladder lumen as noted on the earlier CT. Positive sonographic Murphy sign according to the ultrasound technologist. Common bile duct: Diameter: Approximately 5 mm diameter. Gas within the common bile duct as noted on the CT. Liver: Multilocular fluid collection centrally in the liver as noted on CT measuring approximately 3.3 x 2.1 cm. No other focal hepatic parenchymal abnormality. Patent portal vein with antegrade flow. IMPRESSION: 1. Cholelithiasis and acute cholecystitis. Gas within the gallbladder fundus and the nondistended common bile duct as noted on earlier CT, likely due to a gas producing organism. Less likely, the gas could arise due to a fistula with adjacent bowel. 2. Multilocular fluid collection centrally in the liver as noted on earlier CT, likely a liver abscess, given its proximity to the infected gallbladder. Electronically  Signed   By: Hulan Saashomas  Lawrence M.D.   On: 01/25/2015 07:59

## 2015-02-07 NOTE — ED Notes (Signed)
Pt c/o increased drainage from jp drain and surgical incision. Pt had laparoscopic cholecystectomy by Dr. Lovell SheehanJenkins 10/7. Denies fever/chills/n/v. nad noted.

## 2015-02-07 NOTE — ED Provider Notes (Signed)
CSN: [id number]     Arrival date & time 02/07/15  0935 History  By signing my name below, I, Marica Otterusrat Rahman, attest that this documentation has been prepared under the direction and in the presence of No att. providers found. Electronically Signed: Marica OtterNusrat Rahman, ED Scribe. 02/07/2015. 10:40 AM.   Chief Complaint  Patient presents with  . Post-op Problem   The history is provided by the patient. No language interpreter was used.   PCP: Geraldo PitterBLAND,VEITA J, MD HPI Comments: Sheila [name] is a 59 y.o. female, with PMHx noted below including cholecystectomy completed on 01/27/2015 by Dr. Franky MachoMark Jenkins, MD (daily JP output: 15-80, 3-4 times/day), who presents to the Emergency Department complaining of thin, yellow drainage from surgical incision site. Associated Sx include nausea, decreased appetite, SOB. Pt denies any associated pain. Pt denies fever, vomiting, constipation, chest pain, Hx of DM.  Past Medical History  Diagnosis Date  . Myocardial infarction (HCC) 2012   . CVD (cardiovascular disease)     Stent to mid RCA 2012 [name] 2.5 X 12 mm  . Systemic lupus (HCC)   . Anxiety   . Hypertension   . Arthritis   . Hypercholesterolemia   . PUD (peptic ulcer disease)     Dr. in [place]   Past Surgical History  Procedure Laterality Date  . Coronary angioplasty with stent placement  2012, 2013.    [name] DES to mid RCA. Stents X 2 to unknown artery  . Abdominal hysterectomy    . Tubal ligation    . Breast lumpectomy    . Cholecystectomy N/A 01/27/2015    Procedure: CHOLECYSTECTOMY;  Surgeon: Franky MachoMark Jenkins, MD;  Location: [facility];  Service: General;  Laterality: N/A;  converted to open at 0941  . Esophagogastroduodenoscopy      [place]:PUD per patient  . Colonoscopy  2008    [facility], normal.    Family History  Problem Relation Age of Onset  . CAD Father     CABG  . Heart attack Brother     PTCA  . Hypertension Sister   . Hypertension Father   . Colon cancer Neg Hx   .  Liver disease Neg Hx    Social History  Substance Use Topics  . Smoking status: Former Smoker    Types: Cigarettes  . Smokeless tobacco: None  . Alcohol Use: No   OB History    No data available     Review of Systems  A complete 10 system review of systems was obtained and all systems are negative except as noted in the HPI and PMH.   Allergies  Review of patient's allergies indicates no known allergies.  Home Medications   Prior to Admission medications   Medication Sig Start Date End Date Taking? Authorizing Provider  cloNIDine (CATAPRES) 0.2 MG tablet Take 1 tablet (0.2 mg total) by mouth 2 (two) times daily. 01/30/15  Yes Franky MachoMark Jenkins, MD  docusate sodium (COLACE) 100 MG capsule Take 100 mg by mouth 2 (two) times daily.   Yes Historical Provider, MD  escitalopram (LEXAPRO) 10 MG tablet Take 10 mg by mouth daily.   Yes Historical Provider, MD  ibuprofen (ADVIL,MOTRIN) 800 MG tablet Take 800 mg by mouth every 8 (eight) hours as needed.   Yes Historical Provider, MD  Ibuprofen-Diphenhydramine Cit (ADVIL PM PO) Take 2-3 tablets by mouth at bedtime.   Yes Historical Provider, MD  metoprolol (LOPRESSOR) 50 MG tablet Take 50 mg by mouth 3 (three) times daily.  Yes Historical Provider, MD  Multiple Vitamins-Calcium (ONE-A-DAY WOMENS FORMULA PO) Take 1 tablet by mouth daily.   Yes Historical Provider, MD  Multiple Vitamins-Minerals (HAIR/SKIN/NAILS PO) Take 1 tablet by mouth daily.   Yes Historical Provider, MD  omeprazole (PRILOSEC) 40 MG capsule Take 40 mg by mouth daily.   Yes Historical Provider, MD  ondansetron (ZOFRAN) 8 MG tablet Take 1 tablet (8 mg total) by mouth every 4 (four) hours as needed. 01/22/15  Yes Donnetta Hutching, MD  oxyCODONE-acetaminophen (PERCOCET) 7.5-325 MG tablet Take 1-2 tablets by mouth every 4 (four) hours as needed. 01/30/15  Yes Franky Macho, MD  pregabalin (LYRICA) 75 MG capsule Take 75 mg by mouth daily.   Yes Historical Provider, MD   Triage Vitals: BP  183/85 mmHg  Pulse 57  Temp(Src) 98 F (36.7 C) (Oral)  Resp 18  Ht  (1.549 m)  Wt 148 lb (67.132 kg)  BMI 27.98 kg/m2  SpO2 100% Physical Exam  Constitutional: She is oriented to person, place, and time. She appears well-developed and well-nourished. No distress.  HENT:  Head: Normocephalic and atraumatic.  Mouth/Throat: Oropharynx is clear and moist. No oropharyngeal exudate.  Eyes: Conjunctivae and EOM are normal. Pupils are equal, round, and reactive to light.  Neck: Normal range of motion. Neck supple.  No meningismus.  Cardiovascular: Normal rate, regular rhythm, normal heart sounds and intact distal pulses.   No murmur heard. Pulmonary/Chest: Effort normal and breath sounds normal. No respiratory distress.  Abdominal: Soft. There is no tenderness. There is no rebound, no guarding and no CVA tenderness.  Intact staples in RUQ and periumbilical area. Slight yellow drainage from lateral incision edge. RLQ JP drain with green liquid. No evidence of cellulitis  Musculoskeletal: Normal range of motion. She exhibits no edema or tenderness.  Neurological: She is alert and oriented to person, place, and time. No cranial nerve deficit. She exhibits normal muscle tone. Coordination normal.  No ataxia on finger to nose bilaterally. No pronator drift. 5/5 strength throughout. CN 2-12 intact. Negative Romberg. Equal grip strength. Sensation intact. Gait is normal.   Skin: Skin is warm.  Psychiatric: She has a normal mood and affect. Her behavior is normal.  Nursing note and vitals reviewed.   ED Course  Procedures (including critical care time) DIAGNOSTIC STUDIES: Oxygen Saturation is 100% on ra, nl by my interpretation.    COORDINATION OF CARE:  9:47 AM: Discussed treatment plan which includes labs and consult with Dr. Lovell Sheehan with pt at bedside; patient verbalizes understanding and agrees with treatment plan. Pt offered 10:57 AM: Consult with Dr. Lovell Sheehan who advised pt be  transferred to his office ASAP.  Labs Review Labs Reviewed  CBC WITH DIFFERENTIAL/PLATELET - Abnormal; Notable for the following:    RBC 3.21 (*)    Hemoglobin 9.5 (*)    HCT 29.9 (*)    Platelets 586 (*)    All other components within normal limits  COMPREHENSIVE METABOLIC PANEL - Abnormal; Notable for the following:    Glucose, Bld 104 (*)    Albumin 3.3 (*)    All other components within normal limits  URINALYSIS, ROUTINE W REFLEX MICROSCOPIC (NOT AT [facility]) - Abnormal; Notable for the following:    Hgb urine dipstick SMALL (*)    Protein, ur 30 (*)    All other components within normal limits  URINE MICROSCOPIC-ADD ON - Abnormal; Notable for the following:    Squamous Epithelial / LPF FEW (*)    Bacteria, UA FEW (*)  All other components within normal limits  LIPASE, BLOOD    I have personally reviewed and evaluated these lab results as part of my medical decision-making.   MDM   Final diagnoses:  Draining postoperative wound, initial encounter   Drainage from surgical incision since this morning. Cholecystectomy surgery on October 7. No fever, vomiting, worsening pain. Still having bilious drainage in her JP drain about 200 mL per day.  LFTs and lipase normal. No fever. No leukocytosis. Minimal abdominal pain without peritoneal signs. Doubt cholangitis.  No obvious wound infection.  Patient was supposed to have an appointment with Dr. Lovell Sheehan today at 1045. Discussed with him. He will see her in his office as soon as she leaves the ED. Dr. Lovell Sheehan does not feel she needs any additional abdominal imaging.   Discussed with Dr. Lovell Sheehan the patient still has significant bilious output from her drain up to 200-300 mL per day. He states he will see patient in the office today and likely arrange for ERCP. Patient left the ED directly for Dr. Lovell Sheehan office.  I personally performed the services described in this documentation, which was scribed in my presence. The recorded  information has been reviewed and is accurate.    Glynn Octave, MD 02/07/15 1536

## 2015-02-08 ENCOUNTER — Ambulatory Visit (HOSPITAL_COMMUNITY): Payer: Medicare Other

## 2015-02-08 ENCOUNTER — Encounter (HOSPITAL_COMMUNITY)
Admission: RE | Disposition: A | Discharge status: Home / Self Care | Payer: Self-pay | Source: Ambulatory Visit | Attending: Internal Medicine

## 2015-02-08 ENCOUNTER — Telehealth: Payer: Self-pay | Admitting: Internal Medicine

## 2015-02-08 ENCOUNTER — Ambulatory Visit (HOSPITAL_COMMUNITY): Payer: Medicare Other | Admitting: Anesthesiology

## 2015-02-08 ENCOUNTER — Encounter (HOSPITAL_COMMUNITY): Payer: Self-pay | Admitting: *Deleted

## 2015-02-08 ENCOUNTER — Ambulatory Visit (HOSPITAL_COMMUNITY)
Admission: RE | Admit: 2015-02-08 | Discharge: 2015-02-08 | Disposition: A | Discharge status: Home / Self Care | Payer: Medicare Other | Source: Ambulatory Visit | Attending: Internal Medicine | Admitting: Internal Medicine

## 2015-02-08 DIAGNOSIS — Z4682 Encounter for fitting and adjustment of non-vascular catheter: Secondary | ICD-10-CM | POA: Diagnosis not present

## 2015-02-08 DIAGNOSIS — I252 Old myocardial infarction: Secondary | ICD-10-CM | POA: Insufficient documentation

## 2015-02-08 DIAGNOSIS — Z87891 Personal history of nicotine dependence: Secondary | ICD-10-CM | POA: Insufficient documentation

## 2015-02-08 DIAGNOSIS — F419 Anxiety disorder, unspecified: Secondary | ICD-10-CM | POA: Insufficient documentation

## 2015-02-08 DIAGNOSIS — Z6826 Body mass index (BMI) 26.0-26.9, adult: Secondary | ICD-10-CM | POA: Diagnosis not present

## 2015-02-08 DIAGNOSIS — K819 Cholecystitis, unspecified: Secondary | ICD-10-CM

## 2015-02-08 DIAGNOSIS — M329 Systemic lupus erythematosus, unspecified: Secondary | ICD-10-CM | POA: Diagnosis not present

## 2015-02-08 DIAGNOSIS — R1011 Right upper quadrant pain: Secondary | ICD-10-CM | POA: Insufficient documentation

## 2015-02-08 DIAGNOSIS — E7800 Pure hypercholesterolemia, unspecified: Secondary | ICD-10-CM | POA: Insufficient documentation

## 2015-02-08 DIAGNOSIS — Z8711 Personal history of peptic ulcer disease: Secondary | ICD-10-CM | POA: Insufficient documentation

## 2015-02-08 DIAGNOSIS — I10 Essential (primary) hypertension: Secondary | ICD-10-CM | POA: Diagnosis not present

## 2015-02-08 DIAGNOSIS — R630 Anorexia: Secondary | ICD-10-CM | POA: Diagnosis not present

## 2015-02-08 DIAGNOSIS — K838 Other specified diseases of biliary tract: Secondary | ICD-10-CM

## 2015-02-08 DIAGNOSIS — M1990 Unspecified osteoarthritis, unspecified site: Secondary | ICD-10-CM | POA: Insufficient documentation

## 2015-02-08 DIAGNOSIS — K9189 Other postprocedural complications and disorders of digestive system: Secondary | ICD-10-CM | POA: Diagnosis not present

## 2015-02-08 DIAGNOSIS — I2510 Atherosclerotic heart disease of native coronary artery without angina pectoris: Secondary | ICD-10-CM | POA: Diagnosis not present

## 2015-02-08 DIAGNOSIS — R5383 Other fatigue: Secondary | ICD-10-CM | POA: Diagnosis not present

## 2015-02-08 DIAGNOSIS — K915 Postcholecystectomy syndrome: Secondary | ICD-10-CM | POA: Insufficient documentation

## 2015-02-08 DIAGNOSIS — R110 Nausea: Secondary | ICD-10-CM | POA: Diagnosis not present

## 2015-02-08 HISTORY — PX: ERCP: SHX5425

## 2015-02-08 HISTORY — PX: BILIARY STENT PLACEMENT: SHX5538

## 2015-02-08 HISTORY — PX: SPHINCTEROTOMY: SHX5544

## 2015-02-08 SURGERY — ERCP, WITH INTERVENTION IF INDICATED
Anesthesia: General

## 2015-02-08 MED ORDER — MIDAZOLAM HCL 2 MG/2ML IJ SOLN
INTRAMUSCULAR | Status: AC
  Filled 2015-02-08: qty 2

## 2015-02-08 MED ORDER — SODIUM CHLORIDE 0.9 % IJ SOLN
INTRAMUSCULAR | Status: AC
  Filled 2015-02-08: qty 10

## 2015-02-08 MED ORDER — ROCURONIUM BROMIDE 50 MG/5ML IV SOLN
INTRAVENOUS | Status: AC
  Filled 2015-02-08: qty 1

## 2015-02-08 MED ORDER — NEOSTIGMINE METHYLSULFATE 10 MG/10ML IV SOLN
INTRAVENOUS | Status: DC | PRN
  Administered 2015-02-08: 4 mg via INTRAVENOUS

## 2015-02-08 MED ORDER — LIDOCAINE HCL (CARDIAC) 20 MG/ML IV SOLN
INTRAVENOUS | Status: DC | PRN
  Administered 2015-02-08: 140 mg via INTRAVENOUS
  Administered 2015-02-08: 50 mg via INTRAVENOUS

## 2015-02-08 MED ORDER — LIDOCAINE HCL (PF) 1 % IJ SOLN
INTRAMUSCULAR | Status: AC
  Filled 2015-02-08: qty 5

## 2015-02-08 MED ORDER — PHENYLEPHRINE 40 MCG/ML (10ML) SYRINGE FOR IV PUSH (FOR BLOOD PRESSURE SUPPORT)
PREFILLED_SYRINGE | INTRAVENOUS | Status: AC
Start: 2015-02-08 — End: 2015-02-08
  Filled 2015-02-08: qty 10

## 2015-02-08 MED ORDER — EPHEDRINE SULFATE 50 MG/ML IJ SOLN
INTRAMUSCULAR | Status: AC
Start: 2015-02-08 — End: 2015-02-08
  Filled 2015-02-08: qty 1

## 2015-02-08 MED ORDER — LIDOCAINE VISCOUS 2 % MT SOLN
OROMUCOSAL | Status: AC
  Filled 2015-02-08: qty 15

## 2015-02-08 MED ORDER — ROCURONIUM BROMIDE 100 MG/10ML IV SOLN
INTRAVENOUS | Status: DC | PRN
  Administered 2015-02-08: 30 mg via INTRAVENOUS

## 2015-02-08 MED ORDER — ONDANSETRON HCL 4 MG/2ML IJ SOLN
4.0000 mg | Freq: Once | INTRAMUSCULAR | Status: AC | PRN
  Administered 2015-02-08: 4 mg via INTRAVENOUS
  Filled 2015-02-08: qty 2

## 2015-02-08 MED ORDER — FENTANYL CITRATE (PF) 100 MCG/2ML IJ SOLN
INTRAMUSCULAR | Status: DC | PRN
  Administered 2015-02-08: 25 ug via INTRAVENOUS
  Administered 2015-02-08 (×2): 50 ug via INTRAVENOUS

## 2015-02-08 MED ORDER — IOHEXOL 350 MG/ML SOLN
INTRAVENOUS | Status: DC | PRN
  Administered 2015-02-08: 13:00:00

## 2015-02-08 MED ORDER — GLYCOPYRROLATE 0.2 MG/ML IJ SOLN
INTRAMUSCULAR | Status: DC | PRN
  Administered 2015-02-08: 0.6 mg via INTRAVENOUS

## 2015-02-08 MED ORDER — LACTATED RINGERS IV SOLN
INTRAVENOUS | Status: DC
  Administered 2015-02-08: 12:00:00 via INTRAVENOUS

## 2015-02-08 MED ORDER — MIDAZOLAM HCL 2 MG/2ML IJ SOLN
1.0000 mg | INTRAMUSCULAR | Status: DC | PRN
  Administered 2015-02-08: 2 mg via INTRAVENOUS

## 2015-02-08 MED ORDER — STERILE WATER FOR IRRIGATION IR SOLN
Status: DC | PRN
  Administered 2015-02-08: 13:00:00

## 2015-02-08 MED ORDER — SUCCINYLCHOLINE CHLORIDE 20 MG/ML IJ SOLN
INTRAMUSCULAR | Status: AC
  Filled 2015-02-08: qty 1

## 2015-02-08 MED ORDER — PROPOFOL 10 MG/ML IV BOLUS
INTRAVENOUS | Status: AC
  Filled 2015-02-08: qty 20

## 2015-02-08 MED ORDER — SODIUM CHLORIDE 0.9 % IV SOLN
INTRAVENOUS | Status: AC
  Filled 2015-02-08: qty 100

## 2015-02-08 MED ORDER — GLUCAGON HCL RDNA (DIAGNOSTIC) 1 MG IJ SOLR
INTRAMUSCULAR | Status: AC
  Filled 2015-02-08: qty 1

## 2015-02-08 MED ORDER — FENTANYL CITRATE (PF) 100 MCG/2ML IJ SOLN
INTRAMUSCULAR | Status: AC
  Filled 2015-02-08: qty 4

## 2015-02-08 MED ORDER — LABETALOL HCL 5 MG/ML IV SOLN
INTRAVENOUS | Status: DC | PRN
  Administered 2015-02-08: 5 mg via INTRAVENOUS

## 2015-02-08 MED ORDER — FENTANYL CITRATE (PF) 100 MCG/2ML IJ SOLN
25.0000 ug | INTRAMUSCULAR | Status: DC | PRN
  Administered 2015-02-08 (×4): 50 ug via INTRAVENOUS
  Filled 2015-02-08 (×2): qty 2

## 2015-02-08 SURGICAL SUPPLY — 25 items
BALLN RETRIEVAL 12X15 (BALLOONS) IMPLANT
BALLN RETRIEVAL 12X15MM (BALLOONS)
BASKET TRAPEZOID 3X6 (MISCELLANEOUS) IMPLANT
BASKET TRAPEZOID LITHO 2.5X5 (MISCELLANEOUS) IMPLANT
BILIARY STENT ×3 IMPLANT
BLOCK BITE 60FR ADLT L/F BLUE (MISCELLANEOUS) ×3 IMPLANT
DEVICE INFLATION ENCORE 26 (MISCELLANEOUS) IMPLANT
DEVICE LOCKING W-BIOPSY CAP (MISCELLANEOUS) ×3 IMPLANT
FLOOR PAD 36X40 (MISCELLANEOUS)
GUIDEWIRE HYDRA JAGWIRE .35 (WIRE) IMPLANT
GUIDEWIRE JAG HINI 025X260CM (WIRE) IMPLANT
KIT ENDO PROCEDURE PEN (KITS) ×3 IMPLANT
PAD FLOOR 36X40 (MISCELLANEOUS) IMPLANT
SCOPE SPY DS DISPOSABLE (MISCELLANEOUS) IMPLANT
SNARE ROTATE MED OVAL 20MM (MISCELLANEOUS) IMPLANT
SNARE SHORT THROW 13M SML OVAL (MISCELLANEOUS) IMPLANT
SNARE SHORT THROW 27M MED OVAL (MISCELLANEOUS) IMPLANT
SPHINCTEROTOME AUTOTOME .25 (MISCELLANEOUS) IMPLANT
SPHINCTEROTOME HYDRATOME 44 (MISCELLANEOUS) ×3 IMPLANT
SYR 20CC LL (SYRINGE) IMPLANT
SYR 50ML LL SCALE MARK (SYRINGE) ×6 IMPLANT
SYSTEM CONTINUOUS INJECTION (MISCELLANEOUS) ×3 IMPLANT
WALLSTENT METAL COVERED 10X60 (STENTS) IMPLANT
WALLSTENT METAL COVERED 10X80 (STENTS) IMPLANT
WATER STERILE IRR 1000ML POUR (IV SOLUTION) ×6 IMPLANT

## 2015-02-08 NOTE — Anesthesia Preprocedure Evaluation (Addendum)
Anesthesia Evaluation  Patient identified by MRN, date of birth, ID band Patient awake    Reviewed: Allergy & Precautions, NPO status , Patient's Chart, lab work & pertinent test results, reviewed documented beta blocker date and time   History of Anesthesia Complications Negative for: history of anesthetic complications  Airway Mallampati: II  TM Distance: >3 FB Neck ROM: Full    Dental  (+) Teeth Intact   Pulmonary former smoker,    Pulmonary exam normal        Cardiovascular Exercise Tolerance: Good hypertension, Pt. on home beta blockers and Pt. on medications + CAD, + Past MI and + Cardiac Stents  Normal cardiovascular examI     Neuro/Psych Anxiety    GI/Hepatic PUD,   Endo/Other    Renal/GU Renal InsufficiencyRenal disease     Musculoskeletal  (+) Arthritis , Osteoarthritis,    Abdominal   Peds  Hematology   Anesthesia Other Findings History of Lupus - no meds  Reproductive/Obstetrics                           Anesthesia Physical Anesthesia Plan  ASA: III  Anesthesia Plan: General   Post-op Pain Management:    Induction: Intravenous  Airway Management Planned: Oral ETT  Additional Equipment:   Intra-op Plan:   Post-operative Plan: Extubation in OR  Informed Consent: I have reviewed the patients History and Physical, chart, labs and discussed the procedure including the risks, benefits and alternatives for the proposed anesthesia with the patient or authorized representative who has indicated his/her understanding and acceptance.   Dental advisory given  Plan Discussed with: CRNA  Anesthesia Plan Comments:         Anesthesia Quick Evaluation

## 2015-02-08 NOTE — Transfer of Care (Signed)
Immediate Anesthesia Transfer of Care Note  Patient: Sheila [name]  Procedure(s) Performed: Procedure(s): ENDOSCOPIC RETROGRADE CHOLANGIOPANCREATOGRAPHY (ERCP) (N/A) BILIARY STENT PLACEMENT (N/A) SPHINCTEROTOMY (N/A)  Patient Location: PACU  Anesthesia Type:General  Level of Consciousness: awake, oriented and patient cooperative  Airway & Oxygen Therapy: Patient Spontanous Breathing and Patient connected to face mask oxygen  Post-op Assessment: Report given to RN and Post -op Vital signs reviewed and stable  Post vital signs: Reviewed and stable  Last Vitals:  Filed Vitals:   02/08/15 1220  BP: 144/68  Pulse:   Temp:   Resp: 38    Complications: No apparent anesthesia complications

## 2015-02-08 NOTE — OR Nursing (Signed)
JP drain  Emptied and removed 50cc bile liquid.   Recharged and attached top

## 2015-02-08 NOTE — Anesthesia Procedure Notes (Signed)
Procedure Name: Intubation Date/Time: 02/08/2015 12:32 PM Performed by: Pernell DupreADAMS, Sheila Zurcher A Pre-anesthesia Checklist: Patient identified, Patient being monitored, Timeout performed, Emergency Drugs available and Suction available Patient Re-evaluated:Patient Re-evaluated prior to inductionOxygen Delivery Method: Circle System Utilized Preoxygenation: Pre-oxygenation with 100% oxygen Intubation Type: IV induction Ventilation: Mask ventilation without difficulty Laryngoscope Size: 3 and Miller Grade View: Grade II Tube type: Oral Tube size: 7.0 mm Number of attempts: 1 Airway Equipment and Method: Stylet Placement Confirmation: ETT inserted through vocal cords under direct vision,  positive ETCO2 and breath sounds checked- equal and bilateral Secured at: 21 cm Tube secured with: Tape Dental Injury: Teeth and Oropharynx as per pre-operative assessment

## 2015-02-08 NOTE — Anesthesia Postprocedure Evaluation (Signed)
  Anesthesia Post-op Note  Patient: Sheila [name]  Procedure(s) Performed: Procedure(s): ENDOSCOPIC RETROGRADE CHOLANGIOPANCREATOGRAPHY (ERCP) (N/A) BILIARY STENT PLACEMENT (N/A) SPHINCTEROTOMY (N/A)  Patient Location: PACU  Anesthesia Type:General  Level of Consciousness: awake, alert , oriented and patient cooperative  Airway and Oxygen Therapy: Patient Spontanous Breathing  Post-op Pain: mild  Post-op Assessment: Post-op Vital signs reviewed, Patient's Cardiovascular Status Stable, Respiratory Function Stable, Patent Airway, No signs of Nausea or vomiting and Adequate PO intake              Post-op Vital Signs: Reviewed and stable  Last Vitals:  Filed Vitals:   02/08/15 1415  BP:   Pulse: 87  Temp:   Resp: 17    Complications: No apparent anesthesia complications

## 2015-02-08 NOTE — Interval H&P Note (Signed)
History and Physical Interval Note:  02/08/2015 12:18 PM  Sheila [name]  has presented today for surgery, with the diagnosis of biliary leak  The various methods of treatment have been discussed with the patient and family. After consideration of risks, benefits and other options for treatment, the patient has consented to  Procedure(s) with comments: ENDOSCOPIC RETROGRADE CHOLANGIOPANCREATOGRAPHY (ERCP) (N/A) - 1315 - pt to arrive at 11:00 BILIARY STENT PLACEMENT (N/A) SPHINCTEROTOMY (N/A) as a surgical intervention .  The patient's history has been reviewed, patient examined, no change in status, stable for surgery.  I have reviewed the patient's chart and labs.  Questions were answered to the patient's satisfaction.     Sheila [name]  No change. Patient seen and examined today. Seen in the office yesterday. Patient with persistently high biliary output via JP drain highly suggestive of a leak. Agree with need for ERCP with the stent placement to her promote sealing of the leak. This approach has been reviewed at length with the patient yesterday and again today at the bedside. Potential for sphincterotomy /stent placement which would necessitate a subsequent procedure all reviewed.The risks, benefits, limitations, alternatives, and mponderable have been reviewed with the patient. I specifically discussed a1 in 10 chance of pancreatitis, reaction to medications, bleeding, perforation and the possibility of a failed ERCP. Potential for sphincterotomy and stent placement also reviewed. Questions have been answered. All parties agreeable.

## 2015-02-08 NOTE — Telephone Encounter (Signed)
Patient called at 415 pm to say that she recently had GB surgery and had an ERCP today. She is wanting something for pain to help her sleep until she can get to her PCP tomorrow. [facility] to get prescription, but he had already left for the day and was advised to call here to see if RMR could do something for her. I told her that JL would send RMR a note at the hospital and see what he can advise. She is aware that we may not hear back until tomorrow, but we would call her as soon as we know something. [phone]

## 2015-02-08 NOTE — H&P (View-Only) (Signed)
Primary Care Physician:  [name], MD  Primary Gastroenterologist:  Roetta Sessions, MD   Chief Complaint  Patient presents with  . bile leak    HPI:  Sheila [name] is a 59 y.o. female here for urgent office visit to schedule ERCP with stent placement for biliary leak. Patient underwent open cholecystectomy on 01/27/2015 for acute cholecystitis with cholelithiasis. CT scan showed distended gallbladder with gallbladder wall thickening and mild cholecystic edema, gas in the gallbladder lumen as well as in the cystic and common bile ducts. 33 mm multi locular cyst in the porta hepatis, question biliary cystadenoma or septated hepatic cyst.abdominal ultrasound suggested multilocular fluid collection centrally in the liver, likely a liver abscess given proximity to the infected gallbladder.at time of surgery she was found to have a gangrenous gallbladder and Empyema. Drain was placed in the subhepatic space due to necrotic nature of the cystic duct. Liver bed was also noted to be somewhat necrotic do the gallbladder findings. It was felt that she would likely need an ERCP in the near future.  Patient went to the emergency department today with complaints of drainage from her incision. Labs obtained as outlined below. Dr. Lovell Sheehan saw her in the office today and noted that she had significant ongoing bilious output and her JP drain. Patient emptying every 2-3 hours. Arrangements were made for patient to be seen to coordinate ERCP with biliary stent placement.  Completed Augmentin post-operatively. States that she has ongoing fatigue and anorexia. Consuming basically liquid diet.Some nausea but no vomiting. Good bowel movement on Sunday. Yesterday her urine was dark in color but cleared up with increased fluid intake. Continues to have right upper quadrant abdominal pain, taking pain medication regularly. Denies heartburn. No melena or rectal bleeding. Continues clonidine which was started during her  hospitalization for ongoing hypertension.  No ASA/Plavix.  Patient reports EGD earlier this year in [place], around February, with findings consistent with peptic ulcer disease. No records in [facility]. Aspirin stopped at that time. Last colonoscopy around 2008, normal.    Current Outpatient Prescriptions  Medication Sig Dispense Refill  . cloNIDine (CATAPRES) 0.2 MG tablet Take 1 tablet (0.2 mg total) by mouth 2 (two) times daily. 60 tablet 1  . docusate sodium (COLACE) 100 MG capsule Take 100 mg by mouth 2 (two) times daily.    Marland Kitchen escitalopram (LEXAPRO) 10 MG tablet Take 10 mg by mouth daily.    Marland Kitchen ibuprofen (ADVIL,MOTRIN) 800 MG tablet Take 800 mg by mouth every 8 (eight) hours as needed.    . Ibuprofen-Diphenhydramine Cit (ADVIL PM PO) Take 2-3 tablets by mouth at bedtime.    . metoprolol (LOPRESSOR) 50 MG tablet Take 50 mg by mouth 3 (three) times daily.    . Multiple Vitamins-Calcium (ONE-A-DAY WOMENS FORMULA PO) Take 1 tablet by mouth daily.    . Multiple Vitamins-Minerals (HAIR/SKIN/NAILS PO) Take 1 tablet by mouth daily.    Marland Kitchen omeprazole (PRILOSEC) 40 MG capsule Take 40 mg by mouth daily.    . ondansetron (ZOFRAN) 8 MG tablet Take 1 tablet (8 mg total) by mouth every 4 (four) hours as needed. 10 tablet 0  . oxyCODONE-acetaminophen (PERCOCET) 7.5-325 MG tablet Take 1-2 tablets by mouth every 4 (four) hours as needed. 50 tablet 0  . pregabalin (LYRICA) 75 MG capsule Take 75 mg by mouth daily.     No current facility-administered medications for this visit.    Allergies as of 02/07/2015  . (No Known Allergies)    Past Medical History  Diagnosis Date  . Myocardial infarction (HCC) 2012   . CVD (cardiovascular disease)     Stent to mid RCA 2012 [name] 2.5 X 12 mm  . Systemic lupus (HCC)   . Anxiety   . Hypertension   . Arthritis   . Hypercholesterolemia   . PUD (peptic ulcer disease)     Dr. in [place] in [place]    Past Surgical History  Procedure Laterality Date  .  Coronary angioplasty with stent placement  2012, 2013.    [name] DES to mid RCA. Stents X 2 to unknown artery  . Abdominal hysterectomy    . Tubal ligation    . Breast lumpectomy    . Cholecystectomy N/A 01/27/2015    Procedure: CHOLECYSTECTOMY;  Surgeon: Franky Macho, MD;  Location: [facility];  Service: General;  Laterality: N/A;  converted to open at 0941  . Esophagogastroduodenoscopy      [place]:PUD per patient  . Colonoscopy  2008    [facility], normal.     Family History  Problem Relation Age of Onset  . CAD Father     CABG  . Heart attack Brother     PTCA  . Hypertension Sister   . Hypertension Father   . Colon cancer Neg Hx   . Liver disease Neg Hx     Social History   Social History  . Marital Status: Legally Separated    Spouse Name: N/A  . Number of Children: 2  . Years of Education: N/A   Occupational History  . Nutritionist    Social History Main Topics  . Smoking status: Former Smoker    Types: Cigarettes  . Smokeless tobacco: Not on file  . Alcohol Use: No  . Drug Use: No  . Sexual Activity: No   Other Topics Concern  . Not on file   Social History Narrative   Lives alone. Moved from [place] 2 years ago.       ROS:  General: Negative for  weight loss, fever, chills. See hpi. Eyes: Negative for vision changes.  ENT: Negative for hoarseness, difficulty swallowing , nasal congestion. CV: Negative for chest pain, angina, palpitations, dyspnea on exertion, peripheral edema.  Respiratory: Negative for dyspnea at rest, dyspnea on exertion, cough, sputum, wheezing.  GI: See history of present illness. GU:  Negative for dysuria, hematuria, urinary incontinence, urinary frequency, nocturnal urination.  MS: Negative for joint pain, low back pain.  Derm: Negative for rash or itching.  Neuro: Negative for weakness, abnormal sensation, seizure, frequent headaches, memory loss, confusion.  Psych: Negative for anxiety, depression, suicidal ideation,  hallucinations.  Endo: Negative for unusual weight change.  Heme: Negative for bruising or bleeding. Allergy: Negative for rash or hives.    Physical Examination:  BP 126/67 mmHg  Pulse 59  Temp(Src) 97.1 F (36.2 C) (Oral)  Ht  (1.549 m)  Wt 141 lb 3.2 oz (64.048 kg)  BMI 26.69 kg/m2   General: Well-nourished, well-developed in no acute distress.  Head: Normocephalic, atraumatic.   Eyes: Conjunctiva pink, no icterus. Mouth: Oropharyngeal mucosa moist and pink , no lesions erythema or exudate. Neck: Supple without thyromegaly, masses, or lymphadenopathy.  Lungs: Clear to auscultation bilaterally.  Heart: Regular rate and rhythm, no murmurs rubs or gallops.  Abdomen: Bowel sounds are normal, mild tenderness in ruq, nondistended, no hepatosplenomegaly or masses, no abdominal bruits or    hernia , no rebound or guarding.  Incision with banding on right side with some drainage noted. JP  with bilious drainage Rectal: not performed Extremities: No lower extremity edema. No clubbing or deformities.  Neuro: Alert and oriented x 4 , grossly normal neurologically.  Skin: Warm and dry, no rash or jaundice.   Psych: Alert and cooperative, normal mood and affect.  Labs: Lab Results  Component Value Date   WBC 6.3 02/07/2015   HGB 9.5* 02/07/2015   HCT 29.9* 02/07/2015   MCV 93.1 02/07/2015   PLT 586* 02/07/2015   Lab Results  Component Value Date   CREATININE 0.77 02/07/2015   BUN 11 02/07/2015   NA 138 02/07/2015   K 4.4 02/07/2015   CL 105 02/07/2015   CO2 26 02/07/2015   Lab Results  Component Value Date   ALT 26 02/07/2015   AST 16 02/07/2015   ALKPHOS 83 02/07/2015   BILITOT 0.3 02/07/2015   Lab Results  Component Value Date   LIPASE 26 02/07/2015   No results found for: INR, PROTIME   Imaging Studies: X-ray Chest Pa And Lateral  01/25/2015  CLINICAL DATA:  Pt states she has been having stomach pain onset for 4 days. Pt states she has been vomiting and  feeling nauseous. EXAM: CHEST  2 VIEW COMPARISON:  03/20/2014 FINDINGS: Cardiac silhouette is borderline enlarged. No mediastinal or hilar masses or pathologically enlarged lymph nodes. There is a right coronary artery stent. Clear lungs.  No pleural effusion or pneumothorax. Bony thorax is intact. IMPRESSION: No acute cardiopulmonary disease. Electronically Signed   By: Amie Portland M.D.   On: 01/25/2015 12:40   Ct Abdomen Pelvis W Contrast  01/25/2015  CLINICAL DATA:  Diffuse abdominal pain.  Fever and diarrhea EXAM: CT ABDOMEN AND PELVIS WITH CONTRAST TECHNIQUE: Multidetector CT imaging of the abdomen and pelvis was performed using the standard protocol following bolus administration of intravenous contrast. CONTRAST:  50mL OMNIPAQUE IOHEXOL 300 MG/ML SOLN, OMNIPAQUE IOHEXOL 300 MG/ML SOLN COMPARISON:  None. FINDINGS: Lower chest: Minimal bibasilar atelectasis. No infiltrate or effusion in the lung bases. Heart size upper normal. Hepatobiliary: Gallbladder is distended with gallbladder wall thickening. Mild stranding in the pericholecystic fat. There is a small gas bubble in the fundus of the gallbladder. Multiple calcified gallstones are present. There is gas in the cystic duct and common bile duct. The common bile duct is nondilated. Anterior to the cystic duct in the porta hepatis there is a multilocular cyst measuring 33 mm in diameter. No additional liver lesions identified. No free air no free fluid. Pancreas: Normal pancreas. Negative for pancreatitis or pancreatic mass. Spleen: Negative Adrenals/Urinary Tract: Negative for renal obstruction. No renal mass or renal calculi. Urinary bladder normal. Stomach/Bowel: Negative for bowel obstruction. Small hiatal hernia. Negative for bowel edema. Appendix not visualized. Vascular/Lymphatic: Atherosclerotic calcification in the aorta and iliac arteries. Negative for aortic aneurysm. Reproductive: Hysterectomy.  Negative for pelvic mass. Other: No free  fluid.  No free air.  Negative for adenopathy. Musculoskeletal: Minimal lumbar degenerative change. No acute skeletal abnormality. IMPRESSION: Cholelithiasis. Distended gallbladder with gallbladder wall thickening and mild pericholecystic edema. Findings suggest acute cholecystitis. In addition, there is gas in the gallbladder lumen as well as in the cystic duct and common bile duct compatible with cholecystitis. 33 mm multilocular cyst in the porta hepatis. This may represent a biliary cystadenoma or septated hepatic cyst. Appendix not visualized. Electronically Signed   By: Marlan Palau M.D.   On: 01/25/2015 07:17   US Abdomen Limited Ruq  01/25/2015  CLINICAL DATA:  59 year old with acute onset of upper  abdominal pain associated with nausea, vomiting and diarrhea which began 3 days ago. EXAM: US ABDOMEN LIMITED - RIGHT UPPER QUADRANT COMPARISON:  CT abdomen and pelvis performed earlier same date. FINDINGS: Gallbladder: Numerous shadowing gallstones and echogenic sludge. Gallbladder wall thickening, likely overestimated on ultrasound due to the gas within the gallbladder lumen as noted on the earlier CT. Positive sonographic Murphy sign according to the ultrasound technologist. Common bile duct: Diameter: Approximately 5 mm diameter. Gas within the common bile duct as noted on the CT. Liver: Multilocular fluid collection centrally in the liver as noted on CT measuring approximately 3.3 x 2.1 cm. No other focal hepatic parenchymal abnormality. Patent portal vein with antegrade flow. IMPRESSION: 1. Cholelithiasis and acute cholecystitis. Gas within the gallbladder fundus and the nondistended common bile duct as noted on earlier CT, likely due to a gas producing organism. Less likely, the gas could arise due to a fistula with adjacent bowel. 2. Multilocular fluid collection centrally in the liver as noted on earlier CT, likely a liver abscess, given its proximity to the infected gallbladder. Electronically  Signed   By: Hulan Saashomas  Lawrence M.D.   On: 01/25/2015 07:59

## 2015-02-08 NOTE — Telephone Encounter (Signed)
Dr.Rourk, can she have some pain medication?

## 2015-02-08 NOTE — Progress Notes (Signed)
cc'ed to pcp °

## 2015-02-08 NOTE — Op Note (Signed)
[facility] [address] [place], [id number]   ERCP PROCEDURE REPORT        EXAM DATE: 02/08/2015  PATIENT NAME:          Sheila [name], Sheila [name]          MR #: [id number] BIRTHDATE:       March 15, 1956     VISIT #:     [id number] ATTENDING:     Jonathon Bellows.  Michael Crestina Strike, MD Caleen EssexFACP FACG     STATUS: outpatient REFERRING MD:       Renaye RakersVeita Bland, M.D.  Franky MachoMark Jenkins, M.D.   INDICATIONS:  The patient is a 59 yr old female here for an ERCP due to postoperative biliary leak. PROCEDURE PERFORMED:     ERCP with biliary sphincterotomy and plastic stent placement MEDICATIONS:     Gen.  endotracheal anesthesia [facility]  CONSENT: The patient understands the risks and benefits of the procedure and understands that these risks include, but are not limited to: sedation, allergic reaction, infection, perforation and/or bleeding. Alternative means of evaluation and treatment include, among others: physical exam, x-rays, and/or surgical intervention. The patient elects to proceed with this endoscopic procedure.  DESCRIPTION OF PROCEDURE: During intra-op preparation period all mechanical & medical equipment was checked for proper function. Hand hygiene and appropriate measures for infection prevention was taken. After the risks, benefits and alternatives of the procedure were thoroughly explained, Informed was verified, confirmed and timeout was successfully executed by the treatment team. With the patient in left semi-prone position, medications were administered intravenously.The    was passed from the mouth into the esophagus and further advanced from the esophagus into the stomach. From stomach scope was directed to the second portion of the duodenum     .  Major papilla was aligned with the duodenoscope. The scope position was confirmed fluoroscopically. Rest of the findings/therapeutics are given below. The scope was then completely withdrawn from the patient and the  procedure completed. The pulse, BP, and O2 saturation were monitored and documented by the physician and the nursing staff throughout the entire procedure. The patient was cared for as planned according to standard protocol. The patient was then discharged to recovery in stable condition and with appropriate post procedure care. Estimated blood loss is zero unless otherwise noted in this procedure report.  Specific findings and intervention:  Cursory examination of the distal esophagus, stomach and duodenum through second portion revealed no abnormalities.  The ampulla of Vater was readily identified on the medial wall of the second portion of the duodenum.  The scope was pulled back to the short position approximately 50 cm from the incisors.  Scout film taken.  Using the Microvasive 44 autotome, the ampulla was approached and with guidewire palpation deep biliary cannulation achieved without much difficulty.  Cholangiogram performed.  Florid extravasation of contrast was seen in the area of the cystic duct stump draining into the JP drain.  There was no filling defect or stricture seen.  Surgical staples were in the appropriate position in the vicinity of the cystic duct stump.Intrahepatic biliary tree appeared grossly normal.  The sphincterotome was pulled across the ampullary orifice.  A 7 mm sphincterotomy was performed with the Erbe unit at 12:00 position. This was done without difficulty or apparent complication.  There was no bleeding.  Keeping the safety wire deep in the biliary tree, the sphincterotome was railed off and a 10 [place], 5 cm plastic stent system was railed on  over the wire up into the bile duct.  The stent was deployed in excellent position.  The patient tolerated the procedure well and was taken to the recovery room in stable condition.  The pancreatic duct was not manipulated or injected in any way.  Total fluoroscopy time 55 seconds.    ADVERSE  EVENT:     none IMPRESSIONS:     Postoperative biliary leak (cystic duct stump); status post biliary sphincterotomy and biliary stent placement as described  RECOMMENDATIONS:     Clear liquid diet today; advance diet tomorrow as tolerated. Call if any problems. Follow-up with Dr. Lovell Sheehan on October 24 as scheduled REPEAT EXAM:     We will schedule a follow-up appointment in our office in about a month to plan for stent removal.   ___________________________________ R.  Roetta Sessions, MD FACP [facility] eSigned:  R. Roetta Sessions, MD FACP [facility] 02/08/2015 1:50 PM   cc:     PATIENT NAME:  Sheila [name], Sheila [name] MR#: [id number]

## 2015-02-08 NOTE — Discharge Instructions (Signed)
Standard post ERCP instructions provided  Clear liquid diet remainder of today  May advance diet as tolerated tomorrow morning.  Keep appointment with Dr. Lovell SheehanJenkins next Tuesday, October 25  as scheduled at 1100 AM.  Call me if you have any problems at home.  We will schedule an office visit in preparation to remove the stent placed today in about about 6-8 weeks Endoscopic Retrograde Cholangiopancreatography (ERCP), Care After Refer to this sheet in the next few weeks. These instructions provide you with information on caring for yourself after your procedure. Your health care provider may also give you more specific instructions. Your treatment has been planned according to current medical practices, but problems sometimes occur. Call your health care provider if you have any problems or questions after your procedure.  WHAT TO EXPECT AFTER THE PROCEDURE  After your procedure, it is typical to feel:   Soreness in your throat.   Sick to your stomach (nauseous).   Bloated.  Dizzy.   Fatigued. HOME CARE INSTRUCTIONS  Have a friend or family member stay with you for the first 24 hours after your procedure.  Start taking your usual medicines and eating normally as soon as you feel well enough to do so or as directed by your health care provider. SEEK MEDICAL CARE IF:  You have abdominal pain.   You develop signs of infection, such as:   Chills.   Feeling unwell.  SEEK IMMEDIATE MEDICAL CARE IF:  You have difficulty swallowing.  You have worsening throat, chest, or abdominal pain.  You vomit.  You have bloody or very black stools.  You have a fever.   This information is not intended to replace advice given to you by your health care provider. Make sure you discuss any questions you have with your health care provider.   Document Released: 01/27/2013 Document Reviewed: 01/27/2013 Elsevier Interactive Patient Education [employer]2016 Elsevier Inc.

## 2015-02-08 NOTE — Addendum Note (Signed)
Addendum  created 02/08/15 1435 by Benita StabileLisa Testa Zaynah Chawla, MD   Modules edited: Anesthesia Attestations

## 2015-02-09 ENCOUNTER — Encounter (HOSPITAL_COMMUNITY): Payer: Self-pay | Admitting: Internal Medicine

## 2015-02-09 NOTE — Telephone Encounter (Signed)
Noted. This pain predates her ERCP yesterday but has been going on since her cholecystectomy. I will write her a prescription for Percocet 7. 5-325 milligram tablets. Dispense #30. She is to take 1 orally every 6 hours when necessary pain. No refills. Patient is to follow-up with Dr. Lovell SheehanJenkins next week as planned. This prescription is available for pickup at the endoscopy nurses station here at the hospital

## 2015-02-09 NOTE — Telephone Encounter (Signed)
Called pt to tell her to go by endo and pick up rx and she said she talked to Dr.Jenkins office a little while ago and they are giving her something for pain. She does not need this pain rx right now.

## 2015-02-09 NOTE — Telephone Encounter (Signed)
Spoke with the pt- she said this is not a new or worsening pain, she said it was the same constant pain that she has been having. It is right under her rib cage and she is sore from yesterday and she has some "oozing" from the incision.

## 2015-02-09 NOTE — Telephone Encounter (Signed)
Let's get more information today on how she is doing. Is she having new/worsening pain since ERCP or has it been more of the same. Apparently, no significant pain issue when she was discharged yesterday.

## 2015-02-17 ENCOUNTER — Emergency Department (HOSPITAL_COMMUNITY): Payer: Medicare Other

## 2015-02-17 ENCOUNTER — Inpatient Hospital Stay: Admission: AD | Admit: 2015-02-17 | Payer: Medicare Other | Source: Ambulatory Visit | Admitting: Family Medicine

## 2015-02-17 ENCOUNTER — Inpatient Hospital Stay (HOSPITAL_COMMUNITY)
Admission: EM | Admit: 2015-02-17 | Discharge: 2015-02-23 | DRG: 393 | Disposition: A | Discharge status: Home / Self Care | Payer: Medicare Other | Attending: Internal Medicine | Admitting: Internal Medicine

## 2015-02-17 ENCOUNTER — Encounter (HOSPITAL_COMMUNITY): Payer: Self-pay | Admitting: *Deleted

## 2015-02-17 DIAGNOSIS — S31109A Unspecified open wound of abdominal wall, unspecified quadrant without penetration into peritoneal cavity, initial encounter: Secondary | ICD-10-CM | POA: Diagnosis not present

## 2015-02-17 DIAGNOSIS — R10811 Right upper quadrant abdominal tenderness: Secondary | ICD-10-CM | POA: Diagnosis not present

## 2015-02-17 DIAGNOSIS — I2510 Atherosclerotic heart disease of native coronary artery without angina pectoris: Secondary | ICD-10-CM | POA: Diagnosis not present

## 2015-02-17 DIAGNOSIS — Y832 Surgical operation with anastomosis, bypass or graft as the cause of abnormal reaction of the patient, or of later complication, without mention of misadventure at the time of the procedure: Secondary | ICD-10-CM | POA: Diagnosis present

## 2015-02-17 DIAGNOSIS — E860 Dehydration: Secondary | ICD-10-CM | POA: Diagnosis present

## 2015-02-17 DIAGNOSIS — I10 Essential (primary) hypertension: Secondary | ICD-10-CM | POA: Diagnosis present

## 2015-02-17 DIAGNOSIS — Z955 Presence of coronary angioplasty implant and graft: Secondary | ICD-10-CM

## 2015-02-17 DIAGNOSIS — R7989 Other specified abnormal findings of blood chemistry: Secondary | ICD-10-CM | POA: Diagnosis present

## 2015-02-17 DIAGNOSIS — I252 Old myocardial infarction: Secondary | ICD-10-CM | POA: Diagnosis not present

## 2015-02-17 DIAGNOSIS — K279 Peptic ulcer, site unspecified, unspecified as acute or chronic, without hemorrhage or perforation: Secondary | ICD-10-CM | POA: Diagnosis present

## 2015-02-17 DIAGNOSIS — R112 Nausea with vomiting, unspecified: Secondary | ICD-10-CM | POA: Diagnosis not present

## 2015-02-17 DIAGNOSIS — R932 Abnormal findings on diagnostic imaging of liver and biliary tract: Secondary | ICD-10-CM | POA: Diagnosis not present

## 2015-02-17 DIAGNOSIS — K9189 Other postprocedural complications and disorders of digestive system: Secondary | ICD-10-CM | POA: Diagnosis present

## 2015-02-17 DIAGNOSIS — R945 Abnormal results of liver function studies: Secondary | ICD-10-CM

## 2015-02-17 DIAGNOSIS — I25118 Atherosclerotic heart disease of native coronary artery with other forms of angina pectoris: Secondary | ICD-10-CM | POA: Diagnosis not present

## 2015-02-17 DIAGNOSIS — R1011 Right upper quadrant pain: Secondary | ICD-10-CM

## 2015-02-17 DIAGNOSIS — T85898A Other specified complication of other internal prosthetic devices, implants and grafts, initial encounter: Secondary | ICD-10-CM | POA: Diagnosis not present

## 2015-02-17 DIAGNOSIS — Z87891 Personal history of nicotine dependence: Secondary | ICD-10-CM

## 2015-02-17 DIAGNOSIS — R9431 Abnormal electrocardiogram [ECG] [EKG]: Secondary | ICD-10-CM | POA: Diagnosis not present

## 2015-02-17 DIAGNOSIS — R079 Chest pain, unspecified: Secondary | ICD-10-CM | POA: Diagnosis not present

## 2015-02-17 DIAGNOSIS — Z01810 Encounter for preprocedural cardiovascular examination: Secondary | ICD-10-CM | POA: Diagnosis not present

## 2015-02-17 DIAGNOSIS — K839 Disease of biliary tract, unspecified: Secondary | ICD-10-CM

## 2015-02-17 DIAGNOSIS — E876 Hypokalemia: Secondary | ICD-10-CM | POA: Diagnosis present

## 2015-02-17 DIAGNOSIS — N179 Acute kidney failure, unspecified: Secondary | ICD-10-CM | POA: Diagnosis not present

## 2015-02-17 DIAGNOSIS — I519 Heart disease, unspecified: Secondary | ICD-10-CM | POA: Diagnosis not present

## 2015-02-17 DIAGNOSIS — M329 Systemic lupus erythematosus, unspecified: Secondary | ICD-10-CM | POA: Diagnosis present

## 2015-02-17 DIAGNOSIS — M328 Other forms of systemic lupus erythematosus: Secondary | ICD-10-CM | POA: Diagnosis not present

## 2015-02-17 DIAGNOSIS — I119 Hypertensive heart disease without heart failure: Secondary | ICD-10-CM | POA: Diagnosis present

## 2015-02-17 DIAGNOSIS — R509 Fever, unspecified: Secondary | ICD-10-CM | POA: Diagnosis not present

## 2015-02-17 DIAGNOSIS — E43 Unspecified severe protein-calorie malnutrition: Secondary | ICD-10-CM | POA: Diagnosis present

## 2015-02-17 DIAGNOSIS — R748 Abnormal levels of other serum enzymes: Secondary | ICD-10-CM | POA: Diagnosis not present

## 2015-02-17 DIAGNOSIS — K838 Other specified diseases of biliary tract: Secondary | ICD-10-CM | POA: Diagnosis not present

## 2015-02-17 DIAGNOSIS — Z9889 Other specified postprocedural states: Secondary | ICD-10-CM | POA: Diagnosis not present

## 2015-02-17 DIAGNOSIS — K929 Disease of digestive system, unspecified: Secondary | ICD-10-CM | POA: Diagnosis not present

## 2015-02-17 DIAGNOSIS — R0602 Shortness of breath: Secondary | ICD-10-CM | POA: Diagnosis not present

## 2015-02-17 DIAGNOSIS — E782 Mixed hyperlipidemia: Secondary | ICD-10-CM | POA: Diagnosis not present

## 2015-02-17 DIAGNOSIS — R740 Nonspecific elevation of levels of transaminase and lactic acid dehydrogenase [LDH]: Secondary | ICD-10-CM | POA: Diagnosis not present

## 2015-02-17 LAB — URINE MICROSCOPIC-ADD ON

## 2015-02-17 LAB — COMPREHENSIVE METABOLIC PANEL
ALK PHOS: 151 U/L — AB (ref 38–126)
ALT: 80 U/L — AB (ref 14–54)
AST: 81 U/L — ABNORMAL HIGH (ref 15–41)
Albumin: 4.2 g/dL (ref 3.5–5.0)
Anion gap: 15 (ref 5–15)
BILIRUBIN TOTAL: 0.8 mg/dL (ref 0.3–1.2)
BUN: 45 mg/dL — ABNORMAL HIGH (ref 6–20)
CALCIUM: 10.3 mg/dL (ref 8.9–10.3)
CO2: 20 mmol/L — AB (ref 22–32)
CREATININE: 2.22 mg/dL — AB (ref 0.44–1.00)
Chloride: 101 mmol/L (ref 101–111)
GFR, EST AFRICAN AMERICAN: 26 mL/min — AB (ref >60)
GFR, EST NON AFRICAN AMERICAN: 23 mL/min — AB (ref >60)
Glucose, Bld: 127 mg/dL — ABNORMAL HIGH (ref 65–99)
Potassium: 3.2 mmol/L — ABNORMAL LOW (ref 3.5–5.1)
Sodium: 136 mmol/L (ref 135–145)
TOTAL PROTEIN: 9.2 g/dL — AB (ref 6.5–8.1)

## 2015-02-17 LAB — I-STAT CG4 LACTIC ACID, ED
Lactic Acid, Venous: 1.98 mmol/L (ref 0.5–2.0)
Lactic Acid, Venous: 0.84 mmol/L (ref 0.5–2.0)

## 2015-02-17 LAB — I-STAT TROPONIN, ED: Troponin i, poc: 0 ng/mL (ref 0.00–0.08)

## 2015-02-17 LAB — CBC
HCT: 38.7 % (ref 36.0–46.0)
Hemoglobin: 12.9 g/dL (ref 12.0–15.0)
MCH: 29.3 pg (ref 26.0–34.0)
MCHC: 33.3 g/dL (ref 30.0–36.0)
MCV: 87.8 fL (ref 78.0–100.0)
PLATELETS: 674 10*3/uL — AB (ref 150–400)
RBC: 4.41 MIL/uL (ref 3.87–5.11)
RDW: 13.6 % (ref 11.5–15.5)
WBC: 11.7 10*3/uL — AB (ref 4.0–10.5)

## 2015-02-17 LAB — URINALYSIS, ROUTINE W REFLEX MICROSCOPIC
GLUCOSE, UA: NEGATIVE mg/dL
Hgb urine dipstick: NEGATIVE
KETONES UR: 15 mg/dL — AB
Nitrite: NEGATIVE
PROTEIN: 30 mg/dL — AB
Specific Gravity, Urine: 1.028 (ref 1.005–1.030)
Urobilinogen, UA: 0.2 mg/dL (ref 0.0–1.0)
pH: 6 (ref 5.0–8.0)

## 2015-02-17 LAB — LIPASE, BLOOD: Lipase: 101 U/L — ABNORMAL HIGH (ref 11–51)

## 2015-02-17 MED ORDER — IOHEXOL 300 MG/ML  SOLN
25.0000 mL | Freq: Once | INTRAMUSCULAR | Status: AC | PRN
  Administered 2015-02-17: 25 mL via ORAL

## 2015-02-17 MED ORDER — MORPHINE SULFATE (PF) 2 MG/ML IV SOLN
2.0000 mg | INTRAVENOUS | Status: DC | PRN
  Administered 2015-02-17 – 2015-02-20 (×12): 4 mg via INTRAVENOUS
  Administered 2015-02-20: 2 mg via INTRAVENOUS
  Administered 2015-02-20 – 2015-02-22 (×6): 4 mg via INTRAVENOUS
  Filled 2015-02-17: qty 1
  Filled 2015-02-17 (×19): qty 2

## 2015-02-17 MED ORDER — METOPROLOL TARTRATE 12.5 MG HALF TABLET
12.5000 mg | ORAL_TABLET | Freq: Two times a day (BID) | ORAL | Status: DC
  Administered 2015-02-17 – 2015-02-18 (×3): 12.5 mg via ORAL
  Filled 2015-02-17 (×3): qty 1

## 2015-02-17 MED ORDER — PROMETHAZINE HCL 25 MG/ML IJ SOLN
25.0000 mg | Freq: Four times a day (QID) | INTRAMUSCULAR | Status: DC | PRN
  Administered 2015-02-18: 25 mg via INTRAVENOUS
  Filled 2015-02-17: qty 1

## 2015-02-17 MED ORDER — SODIUM CHLORIDE 0.9 % IJ SOLN
3.0000 mL | Freq: Two times a day (BID) | INTRAMUSCULAR | Status: DC
  Administered 2015-02-20 – 2015-02-21 (×2): 3 mL via INTRAVENOUS

## 2015-02-17 MED ORDER — SODIUM CHLORIDE 0.9 % IV SOLN
INTRAVENOUS | Status: DC
  Administered 2015-02-17 – 2015-02-23 (×9): via INTRAVENOUS

## 2015-02-17 MED ORDER — CLONIDINE HCL 0.1 MG PO TABS
0.1000 mg | ORAL_TABLET | Freq: Every day | ORAL | Status: DC
  Administered 2015-02-17 – 2015-02-18 (×2): 0.1 mg via ORAL
  Filled 2015-02-17 (×2): qty 1

## 2015-02-17 MED ORDER — HEPARIN SODIUM (PORCINE) 5000 UNIT/ML IJ SOLN
5000.0000 [IU] | Freq: Three times a day (TID) | INTRAMUSCULAR | Status: DC
  Administered 2015-02-18 – 2015-02-23 (×11): 5000 [IU] via SUBCUTANEOUS
  Filled 2015-02-17 (×12): qty 1

## 2015-02-17 MED ORDER — CIPROFLOXACIN IN D5W 200 MG/100ML IV SOLN
200.0000 mg | Freq: Two times a day (BID) | INTRAVENOUS | Status: DC
  Administered 2015-02-17 – 2015-02-19 (×5): 200 mg via INTRAVENOUS
  Filled 2015-02-17 (×9): qty 100

## 2015-02-17 MED ORDER — MORPHINE SULFATE (PF) 2 MG/ML IV SOLN
2.0000 mg | Freq: Once | INTRAVENOUS | Status: AC
  Administered 2015-02-17: 2 mg via INTRAVENOUS
  Filled 2015-02-17: qty 1

## 2015-02-17 MED ORDER — ONDANSETRON HCL 4 MG/2ML IJ SOLN
4.0000 mg | Freq: Once | INTRAMUSCULAR | Status: AC
  Administered 2015-02-17: 4 mg via INTRAVENOUS
  Filled 2015-02-17: qty 2

## 2015-02-17 MED ORDER — METRONIDAZOLE IN NACL 5-0.79 MG/ML-% IV SOLN
500.0000 mg | Freq: Three times a day (TID) | INTRAVENOUS | Status: DC
  Administered 2015-02-17 – 2015-02-23 (×17): 500 mg via INTRAVENOUS
  Filled 2015-02-17 (×19): qty 100

## 2015-02-17 MED ORDER — PREGABALIN 75 MG PO CAPS: 75.0000 mg | ORAL_CAPSULE | Freq: Every day | ORAL | Status: DC

## 2015-02-17 MED ORDER — ONDANSETRON HCL 4 MG/2ML IJ SOLN
4.0000 mg | Freq: Four times a day (QID) | INTRAMUSCULAR | Status: DC | PRN
  Administered 2015-02-17 – 2015-02-20 (×8): 4 mg via INTRAVENOUS
  Filled 2015-02-17 (×7): qty 2

## 2015-02-17 MED ORDER — SODIUM CHLORIDE 0.9 % IV BOLUS (SEPSIS)
1000.0000 mL | Freq: Once | INTRAVENOUS | Status: AC
  Administered 2015-02-17: 1000 mL via INTRAVENOUS

## 2015-02-17 MED ORDER — ESCITALOPRAM OXALATE 10 MG PO TABS
10.0000 mg | ORAL_TABLET | Freq: Every day | ORAL | Status: DC
  Administered 2015-02-17 – 2015-02-23 (×5): 10 mg via ORAL
  Filled 2015-02-17 (×6): qty 1

## 2015-02-17 MED ORDER — HYDROMORPHONE HCL 1 MG/ML IJ SOLN
1.0000 mg | Freq: Once | INTRAMUSCULAR | Status: AC
  Administered 2015-02-17: 1 mg via INTRAVENOUS
  Filled 2015-02-17: qty 1

## 2015-02-17 MED ORDER — DOCUSATE SODIUM 100 MG PO CAPS
100.0000 mg | ORAL_CAPSULE | Freq: Two times a day (BID) | ORAL | Status: DC
  Administered 2015-02-17 – 2015-02-23 (×10): 100 mg via ORAL
  Filled 2015-02-17 (×12): qty 1

## 2015-02-17 MED ORDER — SODIUM CHLORIDE 0.9 % IV BOLUS (SEPSIS)
250.0000 mL | Freq: Once | INTRAVENOUS | Status: AC
  Administered 2015-02-17: 250 mL via INTRAVENOUS

## 2015-02-17 MED ORDER — SODIUM CHLORIDE 0.9 % IV BOLUS (SEPSIS)
500.0000 mL | Freq: Once | INTRAVENOUS | Status: AC
  Administered 2015-02-17: 500 mL via INTRAVENOUS

## 2015-02-17 MED ORDER — PANTOPRAZOLE SODIUM 40 MG PO TBEC
40.0000 mg | DELAYED_RELEASE_TABLET | Freq: Every day | ORAL | Status: DC
  Administered 2015-02-17 – 2015-02-23 (×5): 40 mg via ORAL
  Filled 2015-02-17 (×6): qty 1

## 2015-02-17 MED ORDER — POTASSIUM CHLORIDE CRYS ER 20 MEQ PO TBCR
40.0000 meq | EXTENDED_RELEASE_TABLET | ORAL | Status: AC
  Administered 2015-02-17: 40 meq via ORAL
  Filled 2015-02-17: qty 2

## 2015-02-17 NOTE — H&P (Signed)
Patient Demographics  Sheila [name], is a 59 y.o. female  MRN: [id number]   DOB - 02/28/1956  Admit Date - 02/17/2015  Outpatient Primary MD for the patient is [name], MD   With History of -  Past Medical History  Diagnosis Date  . Myocardial infarction (HCC) 2012   . CVD (cardiovascular disease)     Stent to mid RCA 2012 [name] 2.5 X 12 mm  . Systemic lupus (HCC)   . Anxiety   . Hypertension   . Arthritis   . Hypercholesterolemia   . PUD (peptic ulcer disease)     Dr. in [place] in early 2016      Past Surgical History  Procedure Laterality Date  . Coronary angioplasty with stent placement  2012, 2013.    [name] DES to mid RCA. Stents X 2 to unknown artery  . Abdominal hysterectomy    . Tubal ligation    . Breast lumpectomy    . Cholecystectomy N/A 01/27/2015    Procedure: CHOLECYSTECTOMY;  Surgeon: Franky Macho, MD;  Location: [facility];  Service: General;  Laterality: N/A;  converted to open at 0941  . Esophagogastroduodenoscopy      [place]:PUD per patient  . Colonoscopy  2008    [facility], normal.   . Ercp N/A 02/08/2015    Procedure: ENDOSCOPIC RETROGRADE CHOLANGIOPANCREATOGRAPHY (ERCP);  Surgeon: Corbin Ade, MD;  Location: [facility];  Service: Endoscopy;  Laterality: N/A;  . Biliary stent placement N/A 02/08/2015    Procedure: BILIARY STENT PLACEMENT;  Surgeon: Corbin Ade, MD;  Location: [facility];  Service: Endoscopy;  Laterality: N/A;  . Sphincterotomy N/A 02/08/2015    Procedure: SPHINCTEROTOMY;  Surgeon: Corbin Ade, MD;  Location: [facility];  Service: Endoscopy;  Laterality: N/A;    in for   Chief Complaint  Patient presents with  . Abdominal Pain     HPI  [name]  is a 59 y.o. female, past medical history of coronary artery disease, hypertension, lupus and hyperlipidemia, patient with recent hospitalization at [facility], underwent open cholecystectomy on 01/27/2015, she was found to have gangrenous gallbladder and empyema, had  JP drain placed in subhepatic space is due to the necrotic nature of the cystic duct, patient continues to have significant output, underwent ERCP on 02/07/2015 with biliary stent placement, patient was seen yesterday by surgery, had JP drain discontinued, patient complains of abdominal pain, nausea vomiting, report this has been constant during the entire 3 weeks, has worsened over the last 2 weeks, as was sent to ED by her PCP today, workup in ED was significant for worsening LFTs, and acute renal failure with creatinine of 2.2, mild leukocytosis of 11.7, had CT abdomen pelvis done which did show fluid collection in gallbladder fossa tracking anteriorly, I was called to admit.    Review of Systems    In addition to the HPI above,  Reports she felt fever for 1 week, nothing since. No Headache, No changes with Vision or hearing, No problems swallowing food or Liquids, No Chest pain, Cough or Shortness of Breath, And planes of abdominal pain, nausea and vomiting No Blood in stool or Urine, No dysuria, No new skin rashes or bruises, No new joints pains-aches,  No new weakness, tingling, numbness in any extremity, No recent weight gain or loss, No polyuria, polydypsia or polyphagia, No significant Mental Stressors.  A full 10 point Review of Systems was done, except as stated above, all other Review  of Systems were negative.   Social History Social History  Substance Use Topics  . Smoking status: Former Smoker    Types: Cigarettes  . Smokeless tobacco: Not on file  . Alcohol Use: No     Family History Family History  Problem Relation Age of Onset  . CAD Father     CABG  . Heart attack Brother     PTCA  . Hypertension Sister   . Hypertension Father   . Colon cancer Neg Hx   . Liver disease Neg Hx      Prior to Admission medications   Medication Sig Start Date End Date Taking? Authorizing Provider  cloNIDine (CATAPRES) 0.2 MG tablet Take 1 tablet (0.2 mg total) by mouth 2  (two) times daily. 01/30/15  Yes Franky Macho, MD  diphenhydramine-acetaminophen (TYLENOL PM) 25-500 MG TABS tablet Take 1 tablet by mouth at bedtime as needed (sleep,pain).   Yes Historical Provider, MD  docusate sodium (COLACE) 100 MG capsule Take 100 mg by mouth 2 (two) times daily.   Yes Historical Provider, MD  escitalopram (LEXAPRO) 10 MG tablet Take 10 mg by mouth daily.   Yes Historical Provider, MD  metoprolol (LOPRESSOR) 50 MG tablet Take 50 mg by mouth 3 (three) times daily.   Yes Historical Provider, MD  Multiple Vitamins-Calcium (ONE-A-DAY WOMENS FORMULA PO) Take 1 tablet by mouth daily.   Yes Historical Provider, MD  Multiple Vitamins-Minerals (HAIR/SKIN/NAILS PO) Take 1 tablet by mouth daily.   Yes Historical Provider, MD  omeprazole (PRILOSEC) 40 MG capsule Take 40 mg by mouth daily.   Yes Historical Provider, MD  ondansetron (ZOFRAN) 8 MG tablet Take 1 tablet (8 mg total) by mouth every 4 (four) hours as needed. Patient not taking: Reported on 02/17/2015 01/22/15   Donnetta Hutching, MD  oxyCODONE-acetaminophen (PERCOCET) 7.5-325 MG tablet Take 1-2 tablets by mouth every 4 (four) hours as needed. Patient not taking: Reported on 02/17/2015 01/30/15   Franky Macho, MD    Allergies  Allergen Reactions  . Hydrocodone-Acetaminophen Nausea And Vomiting    Physical Exam  Vitals  Blood pressure 151/72, pulse 92, temperature 97.5 F (36.4 C), temperature source Oral, resp. rate 13, SpO2 98 %.   1. General ill-appearing female lying in bed in NAD,    2. Normal affect and insight, Not Suicidal or Homicidal, Awake Alert, Oriented X 3.  3. No F.N deficits, ALL C.Nerves Intact, Strength 5/5 all 4 extremities, Sensation intact all 4 extremities, Plantars down going.  4. Ears and Eyes appear Normal, Conjunctivae clear, PERRLA. Moist Oral Mucosa.  5. Supple Neck, No JVD, No cervical lymphadenopathy appriciated, No Carotid Bruits.  6. Symmetrical Chest wall movement, Good air movement  bilaterally, CTAB.  7. RRR, No Gallops, Rubs or Murmurs, No Parasternal Heave.  8. Positive Bowel Sounds, tenderness in the right abdominal area, no rebound or guarding, had her drainage soaked with bile.  9.  No Cyanosis, Normal Skin Turgor, No Skin Rash or Bruise.  10. Good muscle tone,  joints appear normal , no effusions, Normal ROM.  11. No Palpable Lymph Nodes in Neck or Axillae   Data Review  CBC  Recent Labs Lab 02/17/15 1459  WBC 11.7*  HGB 12.9  HCT 38.7  PLT 674*  MCV 87.8  MCH 29.3  MCHC 33.3  RDW 13.6   ------------------------------------------------------------------------------------------------------------------  Chemistries   Recent Labs Lab 02/17/15 1459  NA 136  K 3.2*  CL 101  CO2 20*  GLUCOSE 127*  BUN 45*  CREATININE 2.22*  CALCIUM 10.3  AST 81*  ALT 80*  ALKPHOS 151*  BILITOT 0.8   ------------------------------------------------------------------------------------------------------------------ estimated creatinine clearance is 22.8 mL/min (by C-G formula based on Cr of 2.22). ------------------------------------------------------------------------------------------------------------------ No results for input(s): TSH, T4TOTAL, T3FREE, THYROIDAB in the last 72 hours.  Invalid input(s): FREET3   Coagulation profile No results for input(s): INR, PROTIME in the last 168 hours. ------------------------------------------------------------------------------------------------------------------- No results for input(s): DDIMER in the last 72 hours. -------------------------------------------------------------------------------------------------------------------  Cardiac Enzymes No results for input(s): CKMB, TROPONINI, MYOGLOBIN in the last 168 hours.  Invalid input(s): CK ------------------------------------------------------------------------------------------------------------------ Invalid input(s):  POCBNP   ---------------------------------------------------------------------------------------------------------------  Urinalysis    Component Value Date/Time   COLORURINE AMBER* 02/17/2015 1435   APPEARANCEUR CLOUDY* 02/17/2015 1435   LABSPEC 1.028 02/17/2015 1435   PHURINE 6.0 02/17/2015 1435   GLUCOSEU NEGATIVE 02/17/2015 1435   HGBUR NEGATIVE 02/17/2015 1435   BILIRUBINUR LARGE* 02/17/2015 1435   KETONESUR 15* 02/17/2015 1435   PROTEINUR 30* 02/17/2015 1435   UROBILINOGEN 0.2 02/17/2015 1435   NITRITE NEGATIVE 02/17/2015 1435   LEUKOCYTESUR SMALL* 02/17/2015 1435    ----------------------------------------------------------------------------------------------------------------  Imaging results:   Ct Abdomen Pelvis Wo Contrast  02/17/2015  CLINICAL DATA:  Recent cholecystectomy for gangrenous cholecystitis. Right upper quadrant abdominal pain. EXAM: CT ABDOMEN AND PELVIS WITHOUT CONTRAST TECHNIQUE: Multidetector CT imaging of the abdomen and pelvis was performed following the standard protocol without IV contrast. COMPARISON:  01/25/2015 FINDINGS: Lower chest: Patchy parenchymal densities in the left lower lobe are concerning for a small focus of aspiration or pneumonia. Evidence for coronary artery calcifications. Hepatobiliary: There is a nonmetallic biliary stent with the distal aspect in the duodenum. Cholecystectomy clips are noted. Vague low-density in the gallbladder fossa is suggestive for fluid. This fluid tracks anterior towards the abdominal wall. Tiny focus of gas near the gallbladder fossa. Three small calcific foci between the liver and right kidney upper pole are concerning for retained gallstones. These small calcifications are grouped together and roughly measure up to 1 cm in size. Inflammatory changes along the right upper anterior abdominal wall related to recent surgery. Limited evaluation of the liver and gallbladder fossa without intravenous contrast.  Pancreas: Normal appearance of the pancreas without duct dilatation or inflammation. Spleen: No acute abnormality to the spleen. Adrenals/Urinary Tract: Normal appearance of the adrenal glands. Normal appearance of both kidneys without stones or hydronephrosis. Normal appearance of the urinary bladder. Stomach/Bowel: Moderate stool burden throughout the colon without acute colonic inflammation. Normal appearance of small bowel without obstruction. There may be mild inflammation of the proximal duodenum adjacent to the liver. Vascular/Lymphatic: Atherosclerotic calcifications in the aorta and iliac arteries without aneurysm. No significant abdominal or pelvic lymphadenopathy. Reproductive: Uterus is surgically absent. Other: No significant free fluid. Musculoskeletal: No suspicious bone lesions. IMPRESSION: Post cholecystectomy. There is a small amount of fluid and gas in the gallbladder fossa with expected inflammatory changes from recent surgery. Nonmetallic biliary stent. Small calcifications between the liver and right kidney are suggestive for retained gallstones. Patchy densities in the left lower lobe are concerning for a small focus of pneumonia or aspiration pneumonitis. Electronically Signed   By: Richarda OverlieAdam  Henn M.D.   On: 02/17/2015 17:26   Dg Chest 2 View  02/17/2015  CLINICAL DATA:  Fever, chest pain, shortness of breath. EXAM: CHEST  2 VIEW COMPARISON:  January 25, 2015. FINDINGS: The heart size and mediastinal contours are within normal limits. Both lungs are clear. No pneumothorax or pleural effusion is noted. The visualized skeletal structures  are unremarkable. IMPRESSION: No active cardiopulmonary disease. Electronically Signed   By: Lupita Raider, M.D.   On: 02/17/2015 15:50        Assessment & Plan  Active Problems:   Bile leak, postoperative   Acute renal failure (ARF) (HCC)   Hypertension   Elevated LFTs   Elevated lipase   Acute renal failure - This is most likely related to  dehydration and volume depletion from her nausea and vomiting. - We'll continue with IV fluids, avoid nephrotoxic medications.  Abdominal pain, nausea and vomiting secondary to continuous biliary leak. -  This is most likely related to continuous biliary leak, patient status post biliary stent placement, her JP drain was discontinued yesterday, discussed the case with Dr. Bosie Clos from GI and Dr. Lindie Spruce from general surgery, given evidence of bile in gallbladder fossa patient will need to have percutaneous drain placed by IR, so she'll be kept nothing by mouth , will place IR consult for percutaneous drain. - Continue with IV fluids, when necessary pain medication. - We'll start empirically on Cipro and Flagyl  Elevated LFTs - Most likely related to her biliary leak, will monitor closely.  Elevated lipase - Mildly elevated, no evidence of pancreatitis on CT abdomen and pelvis, will recheck in a.m.Marland Kitchen  Hypertension - Blood pressure is acceptable, will start her home medication on a lower dose.  DVT Prophylaxis Heparin (to be started after IR evaluation )  AM Labs Ordered, also please review Full Orders  Family Communication: Admission, patients condition and plan of care including tests being ordered have been discussed with the patient and family  who indicate understanding and agree with the plan and Code Status.  Code Status Full  Likely DC to home in stable   Condition GUARDED    Time spent in minutes : 60 minutes    Aybree Lanyon M.D on 02/17/2015 at 6:40 PM  Between 7am to 7pm - Pager - [phone]  After 7pm go to [email or web address] - password [email or web address]  And look for the night coverage person covering me after hours  [facility] Office  [phone]

## 2015-02-17 NOTE — ED Notes (Signed)
Pt to XR

## 2015-02-17 NOTE — ED Notes (Signed)
Attempted to call report

## 2015-02-17 NOTE — ED Provider Notes (Signed)
Assumed care of pt from Dr. Blinda LeatherwoodPollina pending CT abd and wu for pt with recent necrotic cholecystitis.    This returned unremarkable, but pt has AKI.  Admitted in stable condition.  1. Right upper quadrant pain   2. RUQ abdominal pain   3. Biliary anastomotic leak   4. Postoperative bile leak   5. Bile leak   6. Bile leak, postoperative   7. Chest pain   8. Chest pain, unspecified chest pain type      Mirian MoMatthew Josearmando Kuhnert, MD 02/22/15 0001

## 2015-02-17 NOTE — ED Provider Notes (Signed)
CSN: [id number]     Arrival date & time 02/17/15  1405 History   First MD Initiated Contact with Patient 02/17/15 1417     Chief Complaint  Patient presents with  . Abdominal Pain     (Consider location/radiation/quality/duration/timing/severity/associated sxs/prior Treatment) HPI Comments: Patient presents to the emergency department for evaluation of abdominal pain. Patient reports that she had cholecystectomy for gangrenous gallbladder on October 7. Patient reports that she had a drain in postoperatively, had large amounts of bilious drainage after the surgery. She underwent ERCP with stenting on October 19. Patient reports that she has continued to have problems. Drain was removed yesterday. Patient experiencing progressively worsening and now severe right upper quadrant pain. She reports that she continues to have drainage through the site where the JP drain was removed yesterday. She has not had any fever. Patient reports nausea, vomiting, inability to tolerate by mouth intake. She saw her primary doctor in the office today and was referred to the ER for further evaluation.  Patient is a 59 y.o. female presenting with abdominal pain.  Abdominal Pain Associated symptoms: nausea and vomiting     Past Medical History  Diagnosis Date  . Myocardial infarction (HCC) 2012   . CVD (cardiovascular disease)     Stent to mid RCA 2012 [name] 2.5 X 12 mm  . Systemic lupus (HCC)   . Anxiety   . Hypertension   . Arthritis   . Hypercholesterolemia   . PUD (peptic ulcer disease)     Dr. in [place] in early 2016   Past Surgical History  Procedure Laterality Date  . Coronary angioplasty with stent placement  2012, 2013.    [name] DES to mid RCA. Stents X 2 to unknown artery  . Abdominal hysterectomy    . Tubal ligation    . Breast lumpectomy    . Cholecystectomy N/A 01/27/2015    Procedure: CHOLECYSTECTOMY;  Surgeon: Franky Macho, MD;  Location: [facility];  Service: General;  Laterality: N/A;   converted to open at 0941  . Esophagogastroduodenoscopy      [place]:PUD per patient  . Colonoscopy  2008    [facility], normal.   . Ercp N/A 02/08/2015    Procedure: ENDOSCOPIC RETROGRADE CHOLANGIOPANCREATOGRAPHY (ERCP);  Surgeon: Corbin Ade, MD;  Location: [facility];  Service: Endoscopy;  Laterality: N/A;  . Biliary stent placement N/A 02/08/2015    Procedure: BILIARY STENT PLACEMENT;  Surgeon: Corbin Ade, MD;  Location: [facility];  Service: Endoscopy;  Laterality: N/A;  . Sphincterotomy N/A 02/08/2015    Procedure: SPHINCTEROTOMY;  Surgeon: Corbin Ade, MD;  Location: [facility];  Service: Endoscopy;  Laterality: N/A;   Family History  Problem Relation Age of Onset  . CAD Father     CABG  . Heart attack Brother     PTCA  . Hypertension Sister   . Hypertension Father   . Colon cancer Neg Hx   . Liver disease Neg Hx    Social History  Substance Use Topics  . Smoking status: Former Smoker    Types: Cigarettes  . Smokeless tobacco: None  . Alcohol Use: No   OB History    No data available     Review of Systems  Gastrointestinal: Positive for nausea, vomiting and abdominal pain.  All other systems reviewed and are negative.     Allergies  Review of patient's allergies indicates no known allergies.  Home Medications   Prior to Admission medications   Medication Sig  Start Date End Date Taking? Authorizing Provider  cloNIDine (CATAPRES) 0.2 MG tablet Take 1 tablet (0.2 mg total) by mouth 2 (two) times daily. 01/30/15   Franky Macho, MD  docusate sodium (COLACE) 100 MG capsule Take 100 mg by mouth 2 (two) times daily.    Historical Provider, MD  escitalopram (LEXAPRO) 10 MG tablet Take 10 mg by mouth daily.    Historical Provider, MD  ibuprofen (ADVIL,MOTRIN) 800 MG tablet Take 800 mg by mouth every 8 (eight) hours as needed.    Historical Provider, MD  Ibuprofen-Diphenhydramine Cit (ADVIL PM PO) Take 2-3 tablets by mouth at bedtime.    Historical Provider, MD   metoprolol (LOPRESSOR) 50 MG tablet Take 50 mg by mouth 3 (three) times daily.    Historical Provider, MD  Multiple Vitamins-Calcium (ONE-A-DAY WOMENS FORMULA PO) Take 1 tablet by mouth daily.    Historical Provider, MD  Multiple Vitamins-Minerals (HAIR/SKIN/NAILS PO) Take 1 tablet by mouth daily.    Historical Provider, MD  omeprazole (PRILOSEC) 40 MG capsule Take 40 mg by mouth daily.    Historical Provider, MD  ondansetron (ZOFRAN) 8 MG tablet Take 1 tablet (8 mg total) by mouth every 4 (four) hours as needed. 01/22/15   Donnetta Hutching, MD  oxyCODONE-acetaminophen (PERCOCET) 7.5-325 MG tablet Take 1-2 tablets by mouth every 4 (four) hours as needed. 01/30/15   Franky Macho, MD  pregabalin (LYRICA) 75 MG capsule Take 75 mg by mouth daily.    Historical Provider, MD   BP 129/89 mmHg  Pulse 110  Temp(Src) 97.5 F (36.4 C) (Oral)  Resp 20  SpO2 100% Physical Exam  Constitutional: She is oriented to person, place, and time. She appears well-developed and well-nourished. No distress.  HENT:  Head: Normocephalic and atraumatic.  Right Ear: Hearing normal.  Left Ear: Hearing normal.  Nose: Nose normal.  Mouth/Throat: Oropharynx is clear and moist and mucous membranes are normal.  Eyes: Conjunctivae and EOM are normal. Pupils are equal, round, and reactive to light.  Neck: Normal range of motion. Neck supple.  Cardiovascular: Regular rhythm, S1 normal and S2 normal.  Exam reveals no gallop and no friction rub.   No murmur heard. Pulmonary/Chest: Effort normal and breath sounds normal. No respiratory distress. She exhibits no tenderness.  Abdominal: Soft. Normal appearance and bowel sounds are normal. There is no hepatosplenomegaly. There is tenderness in the right upper quadrant. There is no rebound, no guarding, no tenderness at McBurney's point and negative Murphy's sign. No hernia.  Musculoskeletal: Normal range of motion.  Neurological: She is alert and oriented to person, place, and time.  She has normal strength. No cranial nerve deficit or sensory deficit. Coordination normal. GCS eye subscore is 4. GCS verbal subscore is 5. GCS motor subscore is 6.  Skin: Skin is warm, dry and intact. No rash noted. No cyanosis.  Psychiatric: She has a normal mood and affect. Her speech is normal and behavior is normal. Thought content normal.  Nursing note and vitals reviewed.   ED Course  Procedures (including critical care time) Labs Review Labs Reviewed  LIPASE, BLOOD  COMPREHENSIVE METABOLIC PANEL  CBC  URINALYSIS, ROUTINE W REFLEX MICROSCOPIC (NOT AT [facility])  I-STAT CG4 LACTIC ACID, ED  I-STAT CG4 LACTIC ACID, ED    Imaging Review No results found. I have personally reviewed and evaluated these images and lab results as part of my medical decision-making.   EKG Interpretation None      MDM   Final diagnoses:  None  abdominal pain  Patient with complex recent history. Patient underwent cholecystectomy on October 7 for gangrenous gallbladder. Patient had a drain in place postoperatively. Drain continued to put out large amounts of bilious output and patient was seen by gastroenterology. Patient underwent ERCP with stenting to help seal the continuous drain. Patient reports that her drain was removed yesterday and she has had progressively worsening pain since. There is some bilious drainage coming through the drain site externally on the surface of the skin. She has diffuse right upper quadrant tenderness without signs of peritonitis.  Patient will undergo lab work and CT scan to further evaluate. Will sign out to oncoming ER physician for definitive disposition and treatment plan.    Gilda Creasehristopher J Coda Mathey, MD 02/17/15 1515

## 2015-02-17 NOTE — Progress Notes (Signed)
Attempted to take report, nurse will call me back.

## 2015-02-17 NOTE — ED Notes (Signed)
Pt reports having gallbladder removed on 10/7 that was infected. Pt reports poor recovery, not eating x 5 weeks and dehydrated. Sent here by pcp for admission.

## 2015-02-18 ENCOUNTER — Inpatient Hospital Stay (HOSPITAL_COMMUNITY): Payer: Medicare Other

## 2015-02-18 LAB — COMPREHENSIVE METABOLIC PANEL
ALT: 54 U/L (ref 14–54)
AST: 45 U/L — ABNORMAL HIGH (ref 15–41)
Albumin: 2.8 g/dL — ABNORMAL LOW (ref 3.5–5.0)
Alkaline Phosphatase: 106 U/L (ref 38–126)
Anion gap: 12 (ref 5–15)
BILIRUBIN TOTAL: 0.8 mg/dL (ref 0.3–1.2)
BUN: 24 mg/dL — ABNORMAL HIGH (ref 6–20)
CHLORIDE: 114 mmol/L — AB (ref 101–111)
CO2: 17 mmol/L — ABNORMAL LOW (ref 22–32)
CREATININE: 0.95 mg/dL (ref 0.44–1.00)
Calcium: 9.1 mg/dL (ref 8.9–10.3)
Glucose, Bld: 104 mg/dL — ABNORMAL HIGH (ref 65–99)
Potassium: 3.7 mmol/L (ref 3.5–5.1)
Sodium: 143 mmol/L (ref 135–145)
TOTAL PROTEIN: 6.9 g/dL (ref 6.5–8.1)

## 2015-02-18 LAB — CBC
HCT: 29.6 % — ABNORMAL LOW (ref 36.0–46.0)
Hemoglobin: 9.6 g/dL — ABNORMAL LOW (ref 12.0–15.0)
MCH: 28.8 pg (ref 26.0–34.0)
MCHC: 32.4 g/dL (ref 30.0–36.0)
MCV: 88.9 fL (ref 78.0–100.0)
PLATELETS: 482 10*3/uL — AB (ref 150–400)
RBC: 3.33 MIL/uL — AB (ref 3.87–5.11)
RDW: 13.8 % (ref 11.5–15.5)
WBC: 9.3 10*3/uL (ref 4.0–10.5)

## 2015-02-18 LAB — LIPASE, BLOOD: Lipase: 69 U/L — ABNORMAL HIGH (ref 11–51)

## 2015-02-18 MED ORDER — TECHNETIUM TC 99M MEBROFENIN IV KIT
5.3200 | PACK | Freq: Once | INTRAVENOUS | Status: DC | PRN
  Administered 2015-02-18: 5 via INTRAVENOUS
  Filled 2015-02-18: qty 6

## 2015-02-18 MED ORDER — HYDRALAZINE HCL 20 MG/ML IJ SOLN
10.0000 mg | Freq: Four times a day (QID) | INTRAMUSCULAR | Status: DC | PRN
  Administered 2015-02-19 – 2015-02-20 (×2): 10 mg via INTRAVENOUS
  Filled 2015-02-18 (×2): qty 1

## 2015-02-18 MED ORDER — CLONIDINE HCL 0.1 MG PO TABS
0.1000 mg | ORAL_TABLET | Freq: Two times a day (BID) | ORAL | Status: DC
  Administered 2015-02-18 – 2015-02-23 (×9): 0.1 mg via ORAL
  Filled 2015-02-18 (×10): qty 1

## 2015-02-18 MED ORDER — GADOBENATE DIMEGLUMINE 529 MG/ML IV SOLN
12.0000 mL | Freq: Once | INTRAVENOUS | Status: AC | PRN
  Administered 2015-02-18: 12 mL via INTRAVENOUS

## 2015-02-18 MED ORDER — MORPHINE SULFATE (PF) 2 MG/ML IV SOLN
2.0000 mg | Freq: Once | INTRAVENOUS | Status: AC
  Administered 2015-02-18: 2 mg via INTRAVENOUS
  Filled 2015-02-18: qty 1

## 2015-02-18 NOTE — Progress Notes (Signed)
Initial Nutrition Assessment  DOCUMENTATION CODES:   Severe malnutrition in context of acute illness/injury  INTERVENTION:   Follow up for diet advancement and add oral nutrition supplements  NUTRITION DIAGNOSIS:   Inadequate oral intake related to poor appetite, acute illness as evidenced by meal completion < 50%.   GOAL:   Patient will meet greater than or equal to 90% of their needs   MONITOR:   Diet advancement, Labs, Weight trends  REASON FOR ASSESSMENT:   Malnutrition Screening Tool    ASSESSMENT:  Pt has hx of CAD, HTN, Lupus and HLD. She is s/p open [facility]. She was found ot have gangrenous gall bladder.  Pt says she has not eaten solid foods since October 4th. She is currently NPO pending stent placement. 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 Her weight has severely decreased (11%) this month due to her inadequate oral intake .   NFPE completed. Mild orbital, temporal and upper arm depletions.  Labs: BUN 24, glucose 104, lipase 69 improved from 101 yesterday.  Diet Order:  Diet NPO time specified Except for: Sips with Meds  Skin:   closed incision with purulent drainage- right upper abdomen  Last BM:   10/28  Height:   Ht Readings from Last 1 Encounters:  02/17/15 5\' 1"  (1.549 m)    Weight:   Wt Readings from Last 1 Encounters:  02/17/15 129 lb (58.514 kg)    Ideal Body Weight:  47.7 kg  BMI:  Body mass index is 24.39 kg/(m^2).  Estimated Nutritional Needs:   Kcal:  1740   Protein:  88 gr  Fluid:  1.8 liters fluid daily  EDUCATION NEEDS:   Education needs no appropriate at this time  Royann ShiversLynn Abigal Choung MS,RD,CSG,LDN Office: [phone] Pager: [phone]

## 2015-02-18 NOTE — Discharge Instructions (Signed)

## 2015-02-18 NOTE — Consult Note (Addendum)
Reason for Consult: Evaluate bile leak  Referring Physician: Dr. Tana Coast  .  HPI:  This is a 59 year old African-American female from [place].  On 01/27/2015 she presented with acute cholecystitis.  She was taken to the operating room by Dr. [name].  She underwent open cholecystectomy for gangrenous cholecystitis with cholelithiasis.  The gallbladder was fragile and contained multiple stones.  The cystic duct was difficult to fully identify due to gangrenous changes.  The cystic duct tore during control with multiple clips.  One area of leakage was noted at the cystic duct stump and was oversewn with Vicryl sutures.  Dr. Arnoldo Morale clarified the anatomy and left a drain.  Postop she had a bile leak with bilious drainage from her drain.  The bile leak persisted and on 02/08/2015 Dr. Mayford Knife perform ERCP with biliary sphincterotomy and plastic stent placement.  He stated that there was prompt extravasation of bile at the cystic duct stump area.  There was no filling defect or stricture seen.  The intrahepatic biliary tree appear grossly normal.      Subsequently the drain was removed in Dr. Arnoldo Morale office 2 days ago and after that she did begin draining a lot of bilious fluid from the drain site.  She presented to Dr. Fransico Setters office and was admitted to the [facility].      She continues to have nausea, some right upper quadrant pain, and bilious drainage from the drainage site which she is controlling with a towel.  She is afebrile.  She has no clinical evidence of toxicity or sepsis.  Her liver function tests showed AST of 45 but the rest of her liver function test are normal.  Lipase is 69.  White count is 9300.  Hemoglobin 9.6.  Her CT scan shows a tiny amount of fluid and gas in the gallbladder fossa, nothing unexpected.  Nonmetallic biliary stent looks to be well positioned with one end in the duodenum and of the other end up in the common duct.  Some calcifications between the  liver and right kidney are suggestive of retained gallstones.  There doesn't appear to be  enough fluid for the interventional radiologist to safely place a drain.     Comorbidities include myocardial infarction, stent placed in mid  RCA 2012, systemic lupus, anxiety, hypertension, peptic ulcer disease.  Status post open cholecystectomy.  Status post abdominal hysterectomy.    Current medications include Cipro, Flagyl, Protonix, morphine, Lopressor, Apresoline when necessary, subcutaneous heparin for DVT prophylaxis, Lexapro, Colace, Catapres.     I discussed her treatment plan with her, her family, and Dr. Michail Sermon.  Past Medical History  Diagnosis Date  . Myocardial infarction ([place]) 2012   . CVD (cardiovascular disease)     Stent to mid RCA 2012 [name] 2.5 X 12 mm  . Systemic lupus ([place])   . Anxiety   . Hypertension   . Arthritis   . Hypercholesterolemia   . PUD (peptic ulcer disease)     Dr. in [place] in early 2016    Past Surgical History  Procedure Laterality Date  . Coronary angioplasty with stent placement  2012, 2013.    [name] DES to mid RCA. Stents X 2 to unknown artery  . Abdominal hysterectomy    . Tubal ligation    . Breast lumpectomy    . Cholecystectomy N/A 01/27/2015    Procedure: CHOLECYSTECTOMY;  Surgeon: [name], MD;  Location: [facility];  Service: General;  Laterality: N/A;  converted to open  at 0941  . Esophagogastroduodenoscopy      [place]:PUD per patient  . Colonoscopy  2008    [facility], normal.   . Ercp N/A 02/08/2015    Procedure: ENDOSCOPIC RETROGRADE CHOLANGIOPANCREATOGRAPHY (ERCP);  Surgeon: [name], MD;  Location: [facility];  Service: Endoscopy;  Laterality: N/A;  . Biliary stent placement N/A 02/08/2015    Procedure: BILIARY STENT PLACEMENT;  Surgeon: [name], MD;  Location: [facility];  Service: Endoscopy;  Laterality: N/A;  . Sphincterotomy N/A 02/08/2015    Procedure: SPHINCTEROTOMY;  Surgeon: [name], MD;  Location: [facility];   Service: Endoscopy;  Laterality: N/A;    Family History  Problem Relation Age of Onset  . CAD Father     CABG  . Heart attack Brother     PTCA  . Hypertension Sister   . Hypertension Father   . Colon cancer Neg Hx   . Liver disease Neg Hx     Social History:  reports that she has quit smoking. Her smoking use included Cigarettes. She does not have any smokeless tobacco history on file. She reports that she does not drink alcohol or use illicit drugs.  Allergies:  Allergies  Allergen Reactions  . Hydrocodone-Acetaminophen Nausea And Vomiting    Medications: I have reviewed the patient's current medications.  Results for orders placed or performed during the hospital encounter of 02/17/15 (from the past 48 hour(s))  Urinalysis, Routine w reflex microscopic (not at [facility])     Status: Abnormal   Collection Time: 02/17/15  2:35 PM  Result Value Ref Range   Color, Urine AMBER (A) YELLOW    Comment: BIOCHEMICALS MAY BE AFFECTED BY COLOR   APPearance CLOUDY (A) CLEAR   Specific Gravity, Urine 1.028 1.005 - 1.030   pH 6.0 5.0 - 8.0   Glucose, UA NEGATIVE NEGATIVE mg/dL   Hgb urine dipstick NEGATIVE NEGATIVE   Bilirubin Urine LARGE (A) NEGATIVE   Ketones, ur 15 (A) NEGATIVE mg/dL   Protein, ur 30 (A) NEGATIVE mg/dL   Urobilinogen, UA 0.2 0.0 - 1.0 mg/dL   Nitrite NEGATIVE NEGATIVE   Leukocytes, UA SMALL (A) NEGATIVE  Urine microscopic-add on     Status: Abnormal   Collection Time: 02/17/15  2:35 PM  Result Value Ref Range   Squamous Epithelial / LPF FEW (A) RARE   WBC, UA 11-20 <3 WBC/hpf   RBC / HPF 0-2 <3 RBC/hpf   Bacteria, UA FEW (A) RARE   Casts HYALINE CASTS (A) NEGATIVE   Urine-Other FEW YEAST   I-Stat CG4 Lactic Acid, ED     Status: None   Collection Time: 02/17/15  2:55 PM  Result Value Ref Range   Lactic Acid, Venous 1.98 0.5 - 2.0 mmol/L  Lipase, blood     Status: Abnormal   Collection Time: 02/17/15  2:59 PM  Result Value Ref Range   Lipase 101 (H) 11 - 51  U/L    Comment: Please note change in reference range.  Comprehensive metabolic panel     Status: Abnormal   Collection Time: 02/17/15  2:59 PM  Result Value Ref Range   Sodium 136 135 - 145 mmol/L   Potassium 3.2 (L) 3.5 - 5.1 mmol/L   Chloride 101 101 - 111 mmol/L   CO2 20 (L) 22 - 32 mmol/L   Glucose, Bld 127 (H) 65 - 99 mg/dL   BUN 45 (H) 6 - 20 mg/dL   Creatinine, Ser 2.22 (H) 0.44 - 1.00  mg/dL   Calcium 10.3 8.9 - 10.3 mg/dL   Total Protein 9.2 (H) 6.5 - 8.1 g/dL   Albumin 4.2 3.5 - 5.0 g/dL   AST 81 (H) 15 - 41 U/L   ALT 80 (H) 14 - 54 U/L   Alkaline Phosphatase 151 (H) 38 - 126 U/L   Total Bilirubin 0.8 0.3 - 1.2 mg/dL   GFR calc non Af Amer 23 (L) >60 mL/min   GFR calc Af Amer 26 (L) >60 mL/min    Comment: (NOTE) The eGFR has been calculated using the CKD EPI equation. This calculation has not been validated in all clinical situations. eGFR's persistently <60 mL/min signify possible Chronic Kidney Disease.    Anion gap 15 5 - 15  CBC     Status: Abnormal   Collection Time: 02/17/15  2:59 PM  Result Value Ref Range   WBC 11.7 (H) 4.0 - 10.5 K/uL   RBC 4.41 3.87 - 5.11 MIL/uL   Hemoglobin 12.9 12.0 - 15.0 g/dL   HCT 38.7 36.0 - 46.0 %   MCV 87.8 78.0 - 100.0 fL   MCH 29.3 26.0 - 34.0 pg   MCHC 33.3 30.0 - 36.0 g/dL   RDW 13.6 11.5 - 15.5 %   Platelets 674 (H) 150 - 400 K/uL  I-stat troponin, ED     Status: None   Collection Time: 02/17/15  6:01 PM  Result Value Ref Range   Troponin i, poc 0.00 0.00 - 0.08 ng/mL   Comment 3            Comment: Due to the release kinetics of cTnI, a negative result within the first hours of the onset of symptoms does not rule out myocardial infarction with certainty. If myocardial infarction is still suspected, repeat the test at appropriate intervals.   I-Stat CG4 Lactic Acid, ED     Status: None   Collection Time: 02/17/15  6:04 PM  Result Value Ref Range   Lactic Acid, Venous 0.84 0.5 - 2.0 mmol/L  Lipase, blood      Status: Abnormal   Collection Time: 02/18/15  8:25 AM  Result Value Ref Range   Lipase 69 (H) 11 - 51 U/L    Comment: Please note change in reference range. ACCURACY OF RESULTS QUESTIONABLE. RECOMMEND RECOLLECT TO VERIFY.   Comprehensive metabolic panel     Status: Abnormal   Collection Time: 02/18/15  8:25 AM  Result Value Ref Range   Sodium 143 135 - 145 mmol/L    Comment: SLIGHT HEMOLYSIS ACCURACY OF RESULTS QUESTIONABLE. RECOMMEND RECOLLECT TO VERIFY.    Potassium 3.7 3.5 - 5.1 mmol/L    Comment: ACCURACY OF RESULTS QUESTIONABLE. RECOMMEND RECOLLECT TO VERIFY.   Chloride 114 (H) 101 - 111 mmol/L    Comment: ACCURACY OF RESULTS QUESTIONABLE. RECOMMEND RECOLLECT TO VERIFY.   CO2 17 (L) 22 - 32 mmol/L    Comment: ACCURACY OF RESULTS QUESTIONABLE. RECOMMEND RECOLLECT TO VERIFY.   Glucose, Bld 104 (H) 65 - 99 mg/dL    Comment: ACCURACY OF RESULTS QUESTIONABLE. RECOMMEND RECOLLECT TO VERIFY.   BUN 24 (H) 6 - 20 mg/dL    Comment: ACCURACY OF RESULTS QUESTIONABLE. RECOMMEND RECOLLECT TO VERIFY.   Creatinine, Ser 0.95 0.44 - 1.00 mg/dL    Comment: ACCURACY OF RESULTS QUESTIONABLE. RECOMMEND RECOLLECT TO VERIFY.   Calcium 9.1 8.9 - 10.3 mg/dL    Comment: ACCURACY OF RESULTS QUESTIONABLE. RECOMMEND RECOLLECT TO VERIFY.   Total Protein 6.9 6.5 - 8.1 g/dL  Comment: ACCURACY OF RESULTS QUESTIONABLE. RECOMMEND RECOLLECT TO VERIFY.   Albumin 2.8 (L) 3.5 - 5.0 g/dL    Comment: ACCURACY OF RESULTS QUESTIONABLE. RECOMMEND RECOLLECT TO VERIFY.   AST 45 (H) 15 - 41 U/L    Comment: ACCURACY OF RESULTS QUESTIONABLE. RECOMMEND RECOLLECT TO VERIFY.   ALT 54 14 - 54 U/L    Comment: ACCURACY OF RESULTS QUESTIONABLE. RECOMMEND RECOLLECT TO VERIFY.   Alkaline Phosphatase 106 38 - 126 U/L    Comment: ACCURACY OF RESULTS QUESTIONABLE. RECOMMEND RECOLLECT TO VERIFY.   Total Bilirubin 0.8 0.3 - 1.2 mg/dL    Comment: ACCURACY OF RESULTS QUESTIONABLE. RECOMMEND RECOLLECT TO VERIFY.   GFR calc non Af Amer  >60 >60 mL/min    Comment: ACCURACY OF RESULTS QUESTIONABLE. RECOMMEND RECOLLECT TO VERIFY.   GFR calc Af Amer >60 >60 mL/min    Comment: ACCURACY OF RESULTS QUESTIONABLE. RECOMMEND RECOLLECT TO VERIFY. (NOTE) The eGFR has been calculated using the CKD EPI equation. This calculation has not been validated in all clinical situations. eGFR's persistently <60 mL/min signify possible Chronic Kidney Disease.    Anion gap 12 5 - 15    Comment: ACCURACY OF RESULTS QUESTIONABLE. RECOMMEND RECOLLECT TO VERIFY.  CBC     Status: Abnormal   Collection Time: 02/18/15  8:25 AM  Result Value Ref Range   WBC 9.3 4.0 - 10.5 K/uL   RBC 3.33 (L) 3.87 - 5.11 MIL/uL   Hemoglobin 9.6 (L) 12.0 - 15.0 g/dL    Comment: DELTA CHECK NOTED REPEATED TO VERIFY    HCT 29.6 (L) 36.0 - 46.0 %   MCV 88.9 78.0 - 100.0 fL   MCH 28.8 26.0 - 34.0 pg   MCHC 32.4 30.0 - 36.0 g/dL   RDW 13.8 11.5 - 15.5 %   Platelets 482 (H) 150 - 400 K/uL    Ct Abdomen Pelvis Wo Contrast  02/17/2015  CLINICAL DATA:  Recent cholecystectomy for gangrenous cholecystitis. Right upper quadrant abdominal pain. EXAM: CT ABDOMEN AND PELVIS WITHOUT CONTRAST TECHNIQUE: Multidetector CT imaging of the abdomen and pelvis was performed following the standard protocol without IV contrast. COMPARISON:  01/25/2015 FINDINGS: Lower chest: Patchy parenchymal densities in the left lower lobe are concerning for a small focus of aspiration or pneumonia. Evidence for coronary artery calcifications. Hepatobiliary: There is a nonmetallic biliary stent with the distal aspect in the duodenum. Cholecystectomy clips are noted. Vague low-density in the gallbladder fossa is suggestive for fluid. This fluid tracks anterior towards the abdominal wall. Tiny focus of gas near the gallbladder fossa. Three small calcific foci between the liver and right kidney upper pole are concerning for retained gallstones. These small calcifications are grouped together and roughly measure  up to 1 cm in size. Inflammatory changes along the right upper anterior abdominal wall related to recent surgery. Limited evaluation of the liver and gallbladder fossa without intravenous contrast. Pancreas: Normal appearance of the pancreas without duct dilatation or inflammation. Spleen: No acute abnormality to the spleen. Adrenals/Urinary Tract: Normal appearance of the adrenal glands. Normal appearance of both kidneys without stones or hydronephrosis. Normal appearance of the urinary bladder. Stomach/Bowel: Moderate stool burden throughout the colon without acute colonic inflammation. Normal appearance of small bowel without obstruction. There may be mild inflammation of the proximal duodenum adjacent to the liver. Vascular/Lymphatic: Atherosclerotic calcifications in the aorta and iliac arteries without aneurysm. No significant abdominal or pelvic lymphadenopathy. Reproductive: Uterus is surgically absent. Other: No significant free fluid. Musculoskeletal: No suspicious bone lesions. IMPRESSION:  Post cholecystectomy. There is a small amount of fluid and gas in the gallbladder fossa with expected inflammatory changes from recent surgery. Nonmetallic biliary stent. Small calcifications between the liver and right kidney are suggestive for retained gallstones. Patchy densities in the left lower lobe are concerning for a small focus of pneumonia or aspiration pneumonitis. Electronically Signed   By: Markus Daft [name].D.   On: 02/17/2015 17:26   Dg Chest 2 View  02/17/2015  CLINICAL DATA:  Fever, chest pain, shortness of breath. EXAM: CHEST  2 VIEW COMPARISON:  January 25, 2015. FINDINGS: The heart size and mediastinal contours are within normal limits. Both lungs are clear. No pneumothorax or pleural effusion is noted. The visualized skeletal structures are unremarkable. IMPRESSION: No active cardiopulmonary disease. Electronically Signed   By: Marijo Conception, [name].D.   On: 02/17/2015 15:50    ROS Blood pressure  171/58, pulse 85, temperature 98.2 F (36.8 C), temperature source Oral, resp. rate 18, height 5' 1"  (1.549 [name]), weight 58.514 kg (129 lb), SpO2 98 %.     Physical Exam  EXAM: General: Alert.  Does not look toxic or acutely ill.  Alert and oriented.  Family in room Head and neck.  Normocephalic.  Sclera clear.  Extraocular movements intact.  Neck supple and nontender No neck mass or adenopathy. Lungs: Clear to auscultation Cardiovascular: Regular rate and rhythm.  No murmur.  No ectopy.  Radial pulses intact Abdomen.  Hypoactive bowel sounds.  Soft.  Nondistended.  Incision  healing well.  Drain site draining bilious fluid, low volume.  Spots on towel.  No mass.  Minimal right upper quadrant tenderness.  extremities.  Free range of motion.  No edema.  No cyanosis.  Skin turgor good. Neurologic: Alert and oriented 4.  No gross motor or sensory deficits.  Gait not tested.    Assessment/Plan:  Presumed postoperative bile leak.  Question whether she has ongoing leak at the cystic duct stump or is simply evacuating all fluid.  There is not enough fluid in the abdomen for interventional radiology to place a drain. Doubt major hepatic duct injury. Nothing by mouth Continue Cipro and Flagyl We'll place drainage bag on drain site to control drainage, protect skin, and quantitate drainage Hepatobiliary scan stat to see if there is ongoing internal leak. Treatment plan coordinated with Dr. Michail Sermon from gastroenterology service   Status post recent open cholecystectomy for gangrenous cholecystitis(01/27/2015).  Some difficulty in controlling cystic duct reported by surgeon. Possible retained gallstones by CT scan  Status post recent ERCP with stent placement.(02/08/2015)  Stent appears to be in proper position.  Question whether stent is working or is occluded.  Dr. Michail Sermon to assess.  History myocardial infarction, stent placed in mid right RCA 2012,  systemic lupus anxiety  hypertension   peptic ulcer disease.   Status post open cholecystectomy.  (recent) Status post abdominal hysterectomy.  Sheila [name] 02/18/2015, 12:24 PM

## 2015-02-18 NOTE — Progress Notes (Signed)
Request seen for image guided gallbladder fossa drain.  Imaging reviewed by Dr. Corlis Leak Hassell.  Fluid collection not large enough to drain.  Spoke with Dr. Isidoro Donningai and made aware.  Avaleen Brownley S Jametta Moorehead PA-C 02/18/2015 10:42 AM

## 2015-02-18 NOTE — Progress Notes (Signed)
[facility]                                                                              Patient Demographics  Sheila [name], is a 59 y.o. female, DOB - [birth year], UJW:[id number]  Admit date - 02/17/2015   Admitting Physician Starleen Armsawood S Elgergawy, MD  Outpatient Primary MD for the patient is Geraldo PitterBLAND,VEITA J, MD  LOS - 1   Chief Complaint  Patient presents with  . Abdominal Pain       Brief HPI  Per Dr.Elgergawy on 02/17/15 Sheila [name] is a 59 y.o. female, past medical history of coronary artery disease, hypertension, lupus and hyperlipidemia, patient with recent hospitalization at [facility], underwent open cholecystectomy on 01/27/2015, she was found to have gangrenous gallbladder and empyema, had JP drain placed in subhepatic space is due to the necrotic nature of the cystic duct, patient continues to have significant output, underwent ERCP on 02/07/2015 with biliary stent placement, patient was seen yesterday by surgery, had JP drain discontinued, patient complains of abdominal pain, nausea vomiting, report this has been constant during the entire 3 weeks, has worsened over the last 2 weeks, as was sent to ED by her PCP today, workup in ED was significant for worsening LFTs, and acute renal failure with creatinine of 2.2, mild leukocytosis of 11.7, had CT abdomen pelvis done which did show fluid collection in gallbladder fossa tracking anteriorly. Hospitalist service requested for admission   Assessment & Plan    Principal problem Abdominal pain, nausea and vomiting secondary to continuous biliary leak.: LFTs are trending down, lipase is trending down  - t likely related to continuous biliary leak, patient status post biliary stent placement, her JP drain was discontinued on 10/27  - Imagings noted by interventional radiology, noted did not large enough fluid collection to drain.  -Discussed with gastroenterology, Dr. Bosie ClosSchooler and general surgery who will  follow patient today for further recommendations.  - Continue IV Cipro and Flagyl, fluids, still NPO     Active problems  Acute renal failure likely related to dehydration and volume depletion from her nausea and vomiting. -Resolved with IV fluids, avoid nephrotoxic medications.  Elevated LFTs - Most likely related to her biliary leak, improving, follow GI recommendations   Elevated lipase - Mildly elevated, no evidence of pancreatitis on CT abdomen and pelvis, improving    Hypertension BP currently elevated, increase clonidine to 0.1 mg twice a day, hydralazine IV as needed  Code Status: Full code   Family Communication: Discussed in detail with the patient, all imaging results, lab results explained to the patient  and husband   Disposition Plan: Not medically ready   Time Spent in minutes 25 mins   Procedures  CT abdomen   Consults   Interventional radiology   GI Surgery  DVT Prophylaxis heparin subcutaneous   Medications  Scheduled Meds: . ciprofloxacin  200 mg Intravenous Q12H  . cloNIDine  0.1 mg Oral Daily  . docusate sodium  100 mg Oral BID  . escitalopram  10 mg Oral Daily  . heparin  5,000 Units Subcutaneous 3 times per day  . metoprolol  12.5 mg Oral BID  . metronidazole  500 mg Intravenous Q8H  . pantoprazole  40 mg Oral Daily  . sodium chloride  3 mL Intravenous Q12H   Continuous Infusions: . sodium chloride 100 mL/hr at 02/18/15 0717   PRN Meds:.morphine injection, ondansetron (ZOFRAN) IV, promethazine   Antibiotics   Anti-infectives    Start     Dose/Rate Route Frequency Ordered Stop   02/17/15 2000  ciprofloxacin (CIPRO) IVPB 200 mg     200 mg 100 mL/hr over 60 Minutes Intravenous Every 12 hours 02/17/15 1911     02/17/15 1915  metroNIDAZOLE (FLAGYL) IVPB 500 mg     500 mg 100 mL/hr over 60 Minutes Intravenous Every 8 hours 02/17/15 1911          Subjective:   Sheila [name] was seen and examined today.  Continues to have biliary  leak and abdominal tenderness. No fevers. Denies any dizziness, chest pain, shortness of breath, N/V/D/C, new weakness, numbess, tingling. No acute events overnight.    Objective:   Blood pressure 171/58, pulse 85, temperature 98.2 F (36.8 C), temperature source Oral, resp. rate 18, height 5\' 1"  (1.549 m), weight 58.514 kg (129 lb), SpO2 98 %.  Wt Readings from Last 3 Encounters:  02/17/15 58.514 kg (129 lb)  02/08/15 63.957 kg (141 lb)  02/07/15 67.132 kg (148 lb)     Intake/Output Summary (Last 24 hours) at 02/18/15 1143 Last data filed at 02/18/15 0858  Gross per 24 hour  Intake 1951.67 ml  Output    500 ml  Net 1451.67 ml    Exam  General: Alert and oriented x 3, NAD  HEENT:  PERRLA, EOMI, Anicteric Sclera, mucous membranes moist.   Neck: Supple, no JVD, no masses  CVS: S1 S2 auscultated, no rubs, murmurs or gallops. Regular rate and rhythm.  Respiratory: Clear to auscultation bilaterally, no wheezing, rales or rhonchi  Abdomen: Soft, tenderness in the right abdominal area, biliary drainage, dressings/towels soaked   Ext: no cyanosis clubbing or edema  Neuro: AAOx3, Cr N's II- XII. Strength 5/5 upper and lower extremities bilaterally  Skin: No rashes  Psych: Normal affect and demeanor, alert and oriented x3    Data Review   Micro Results No results found for this or any previous visit (from the past 240 hour(s)).  Radiology Reports Ct Abdomen Pelvis Wo Contrast  02/17/2015  CLINICAL DATA:  Recent cholecystectomy for gangrenous cholecystitis. Right upper quadrant abdominal pain. EXAM: CT ABDOMEN AND PELVIS WITHOUT CONTRAST TECHNIQUE: Multidetector CT imaging of the abdomen and pelvis was performed following the standard protocol without IV contrast. COMPARISON:  01/25/2015 FINDINGS: Lower chest: Patchy parenchymal densities in the left lower lobe are concerning for a small focus of aspiration or pneumonia. Evidence for coronary artery calcifications.  Hepatobiliary: There is a nonmetallic biliary stent with the distal aspect in the duodenum. Cholecystectomy clips are noted. Vague low-density in the gallbladder fossa is suggestive for fluid. This fluid tracks anterior towards the abdominal wall. Tiny focus of gas near the gallbladder fossa. Three small calcific foci between the liver and right kidney upper pole are concerning for retained gallstones. These small calcifications are grouped together and roughly measure up to 1 cm in size. Inflammatory changes along the right upper anterior abdominal wall related to recent surgery. Limited evaluation of the liver and gallbladder fossa without intravenous contrast. Pancreas: Normal appearance of the pancreas without duct dilatation or inflammation. Spleen: No acute abnormality to the spleen. Adrenals/Urinary Tract: Normal appearance of  the adrenal glands. Normal appearance of both kidneys without stones or hydronephrosis. Normal appearance of the urinary bladder. Stomach/Bowel: Moderate stool burden throughout the colon without acute colonic inflammation. Normal appearance of small bowel without obstruction. There may be mild inflammation of the proximal duodenum adjacent to the liver. Vascular/Lymphatic: Atherosclerotic calcifications in the aorta and iliac arteries without aneurysm. No significant abdominal or pelvic lymphadenopathy. Reproductive: Uterus is surgically absent. Other: No significant free fluid. Musculoskeletal: No suspicious bone lesions. IMPRESSION: Post cholecystectomy. There is a small amount of fluid and gas in the gallbladder fossa with expected inflammatory changes from recent surgery. Nonmetallic biliary stent. Small calcifications between the liver and right kidney are suggestive for retained gallstones. Patchy densities in the left lower lobe are concerning for a small focus of pneumonia or aspiration pneumonitis. Electronically Signed   By: Richarda Overlie M.D.   On: 02/17/2015 17:26   Dg Chest  2 View  02/17/2015  CLINICAL DATA:  Fever, chest pain, shortness of breath. EXAM: CHEST  2 VIEW COMPARISON:  January 25, 2015. FINDINGS: The heart size and mediastinal contours are within normal limits. Both lungs are clear. No pneumothorax or pleural effusion is noted. The visualized skeletal structures are unremarkable. IMPRESSION: No active cardiopulmonary disease. Electronically Signed   By: Lupita Raider, M.D.   On: 02/17/2015 15:50   X-ray Chest Pa And Lateral  01/25/2015  CLINICAL DATA:  Pt states she has been having stomach pain onset for 4 days. Pt states she has been vomiting and feeling nauseous. EXAM: CHEST  2 VIEW COMPARISON:  03/20/2014 FINDINGS: Cardiac silhouette is borderline enlarged. No mediastinal or hilar masses or pathologically enlarged lymph nodes. There is a right coronary artery stent. Clear lungs.  No pleural effusion or pneumothorax. Bony thorax is intact. IMPRESSION: No acute cardiopulmonary disease. Electronically Signed   By: Amie Portland M.D.   On: 01/25/2015 12:40   Ct Abdomen Pelvis W Contrast  01/25/2015  CLINICAL DATA:  Diffuse abdominal pain.  Fever and diarrhea EXAM: CT ABDOMEN AND PELVIS WITH CONTRAST TECHNIQUE: Multidetector CT imaging of the abdomen and pelvis was performed using the standard protocol following bolus administration of intravenous contrast. CONTRAST:  50mL OMNIPAQUE IOHEXOL 300 MG/ML SOLN, OMNIPAQUE IOHEXOL 300 MG/ML SOLN COMPARISON:  None. FINDINGS: Lower chest: Minimal bibasilar atelectasis. No infiltrate or effusion in the lung bases. Heart size upper normal. Hepatobiliary: Gallbladder is distended with gallbladder wall thickening. Mild stranding in the pericholecystic fat. There is a small gas bubble in the fundus of the gallbladder. Multiple calcified gallstones are present. There is gas in the cystic duct and common bile duct. The common bile duct is nondilated. Anterior to the cystic duct in the porta hepatis there is a multilocular cyst  measuring 33 mm in diameter. No additional liver lesions identified. No free air no free fluid. Pancreas: Normal pancreas. Negative for pancreatitis or pancreatic mass. Spleen: Negative Adrenals/Urinary Tract: Negative for renal obstruction. No renal mass or renal calculi. Urinary bladder normal. Stomach/Bowel: Negative for bowel obstruction. Small hiatal hernia. Negative for bowel edema. Appendix not visualized. Vascular/Lymphatic: Atherosclerotic calcification in the aorta and iliac arteries. Negative for aortic aneurysm. Reproductive: Hysterectomy.  Negative for pelvic mass. Other: No free fluid.  No free air.  Negative for adenopathy. Musculoskeletal: Minimal lumbar degenerative change. No acute skeletal abnormality. IMPRESSION: Cholelithiasis. Distended gallbladder with gallbladder wall thickening and mild pericholecystic edema. Findings suggest acute cholecystitis. In addition, there is gas in the gallbladder lumen as well as in the cystic  duct and common bile duct compatible with cholecystitis. 33 mm multilocular cyst in the porta hepatis. This may represent a biliary cystadenoma or septated hepatic cyst. Appendix not visualized. Electronically Signed   By: Marlan Palau M.D.   On: 01/25/2015 07:17   Dg Ercp Biliary & Pancreatic Ducts  02/08/2015  CLINICAL DATA:  Sphincterotomy.  Biliary stent placement. EXAM: ERCP TECHNIQUE: Multiple spot images obtained with the fluoroscopic device and submitted for interpretation post-procedure. COMPARISON:  CT abdomen and pelvis- 01/25/2015; abdominal ultrasound - 01/25/2015 FINDINGS: Three spot intraoperative images during ERCP are provided for review. Initial image demonstrates an ERCP probe overlying the right upper abdominal quadrant. Surgical clips overlie the expected location of the gallbladder fossa. A surgical drain also overlies the expected location of the gallbladder fossa. Subsequent images demonstrate selective cannulation opacification of the CBD.  There is apparent extravasation of injected contrast adjacent to the cholecystectomy clips as well as the cranial end of the surgically placed drain. There is no definitive opacification of the cystic duct though evaluation is degraded secondary to patient respiratory artifact. Completion image demonstrates placement of a internal plastic biliary stent overlying expected location of the CBD. IMPRESSION: 1. ERCP with biliary stent placement as above. 2. Contrast injection demonstrates extravasation of contrast about the cholecystectomy clips as well as the cranial aspect of the surgically placed drain indicative of a biliary leak. The cystic duct was not definitely identified though evaluation is degraded secondary to patient respiration. Correlation with the operative report is recommended. These images were submitted for radiologic interpretation only. Please see the procedural report for the amount of contrast and the fluoroscopy time utilized. Electronically Signed   By: Simonne Come M.D.   On: 02/08/2015 14:56   US Abdomen Limited Ruq  01/25/2015  CLINICAL DATA:  59 year old with acute onset of upper abdominal pain associated with nausea, vomiting and diarrhea which began 3 days ago. EXAM: US ABDOMEN LIMITED - RIGHT UPPER QUADRANT COMPARISON:  CT abdomen and pelvis performed earlier same date. FINDINGS: Gallbladder: Numerous shadowing gallstones and echogenic sludge. Gallbladder wall thickening, likely overestimated on ultrasound due to the gas within the gallbladder lumen as noted on the earlier CT. Positive sonographic Murphy sign according to the ultrasound technologist. Common bile duct: Diameter: Approximately 5 mm diameter. Gas within the common bile duct as noted on the CT. Liver: Multilocular fluid collection centrally in the liver as noted on CT measuring approximately 3.3 x 2.1 cm. No other focal hepatic parenchymal abnormality. Patent portal vein with antegrade flow. IMPRESSION: 1. Cholelithiasis  and acute cholecystitis. Gas within the gallbladder fundus and the nondistended common bile duct as noted on earlier CT, likely due to a gas producing organism. Less likely, the gas could arise due to a fistula with adjacent bowel. 2. Multilocular fluid collection centrally in the liver as noted on earlier CT, likely a liver abscess, given its proximity to the infected gallbladder. Electronically Signed   By: Hulan Saas M.D.   On: 01/25/2015 07:59    CBC  Recent Labs Lab 02/17/15 1459 02/18/15 0825  WBC 11.7* 9.3  HGB 12.9 9.6*  HCT 38.7 29.6*  PLT 674* 482*  MCV 87.8 88.9  MCH 29.3 28.8  MCHC 33.3 32.4  RDW 13.6 13.8    Chemistries   Recent Labs Lab 02/17/15 1459 02/18/15 0825  NA 136 143  K 3.2* 3.7  CL 101 114*  CO2 20* 17*  GLUCOSE 127* 104*  BUN 45* 24*  CREATININE 2.22* 0.95  CALCIUM  10.3 9.1  AST 81* 45*  ALT 80* 54  ALKPHOS 151* 106  BILITOT 0.8 0.8   ------------------------------------------------------------------------------------------------------------------ estimated creatinine clearance is 51.1 mL/min (by C-G formula based on Cr of 0.95). ------------------------------------------------------------------------------------------------------------------ No results for input(s): HGBA1C in the last 72 hours. ------------------------------------------------------------------------------------------------------------------ No results for input(s): CHOL, HDL, LDLCALC, TRIG, CHOLHDL, LDLDIRECT in the last 72 hours. ------------------------------------------------------------------------------------------------------------------ No results for input(s): TSH, T4TOTAL, T3FREE, THYROIDAB in the last 72 hours.  Invalid input(s): FREET3 ------------------------------------------------------------------------------------------------------------------ No results for input(s): VITAMINB12, FOLATE, FERRITIN, TIBC, IRON, RETICCTPCT in the last 72  hours.  Coagulation profile No results for input(s): INR, PROTIME in the last 168 hours.  No results for input(s): DDIMER in the last 72 hours.  Cardiac Enzymes No results for input(s): CKMB, TROPONINI, MYOGLOBIN in the last 168 hours.  Invalid input(s): CK ------------------------------------------------------------------------------------------------------------------ Invalid input(s): POCBNP  No results for input(s): GLUCAP in the last 72 hours.   RAI,RIPUDEEP M.D. [facility] 02/18/2015, 11:43 AM  Pager: [phone] Between 7am to 7pm - call Pager - [phone]  After 7pm go to [email or web address] - password [email or web address]  Call night coverage person covering after 7pm

## 2015-02-18 NOTE — Progress Notes (Addendum)
Late Entry - During the night, patient had multiple issues with severe right upper quadrant abdominal pain that the prn morphine q 4 hour order did not control.  Called [facility] and received order for breakthrough pain - morphine 2 mg was given at 2307 and 0331 for breakthrough pain.  4 mg morphine prn q 4 hours was given at 2026, 0114, and 0527.  Patient was unable to take 40 meq PO potassium in the ED and vomited one half tablet when given on the floor.  [facility] made aware and will check potassium levels in the am.  Her surgical incision from her cholecystectomy  is well healed.  She had a RUQ drain that was removed on 02/16/2015.  Upon arrival to the floor, this site had a 2 x 2 gauze that was saturated with yellow purulent drainage.  The wound is round and measures 1 cm x 1 cm with yellow base.  [facility] was made aware of this.  The dressing was changed at 1959 and again at 0331 due to drainage saturating a 4 x 4 gauze dressing.  Will continue to monitor the patient.  [employer] [employer] MinorKimberly Unita Detamore RN-BC, WTA.

## 2015-02-18 NOTE — Consult Note (Signed)
Referring Provider: Dr. Isidoro Donning Primary Care Physician:  [name], MD Primary Gastroenterologist:  Gentry Fitz  Reason for Consultation:  Bile leak  HPI: Sheila [name] is a 59 y.o. female who is s/p open cholecystectomy on 01/27/15 in [place] (Dr. Lovell Sheehan) where the gallbladder was found to be gangrenous. A cystic duct tear and leakage occurred, which was sutured. No CBD injury noted per op report. She continued to have abdominal pain postop and had a biliary stent placed on 02/08/15 by Dr. Jena Gauss. ERCP showed the bile leak to be in the area of the cystic duct stump with drainage into the JP drain. No filling defect or stricture was seen on the cholangiogram. She reports continued abdominal pain, nausea, and fevers and persistent drainage of bile from the JP drain. The drain was removed 2 days ago and she continued to have bile leaking from the drain site and was sent to the ER by Dr. Parke Simmers, who she saw yesterday. Reports severe RUQ and epigastric pain that she says feels "like a stone is trying to come out." Having N/V with inability to eat. No BMs. CT shows small amount of fluid in GB fossa and biliary stent in place. Small gallstones noted in the abdominal cavity between the liver and right kidney. TB 0.8, ALP 151, AST 81, ALT 80, Lipase 101 yesterday (02/18/15: TB 0.8, ALP 106, AST 45, ALT 54).    Past Medical History  Diagnosis Date  . Myocardial infarction (HCC) 2012   . CVD (cardiovascular disease)     Stent to mid RCA 2012 [name] 2.5 X 12 mm  . Systemic lupus (HCC)   . Anxiety   . Hypertension   . Arthritis   . Hypercholesterolemia   . PUD (peptic ulcer disease)     Dr. in [place] in early 2016    Past Surgical History  Procedure Laterality Date  . Coronary angioplasty with stent placement  2012, 2013.    [name] DES to mid RCA. Stents X 2 to unknown artery  . Abdominal hysterectomy    . Tubal ligation    . Breast lumpectomy    . Cholecystectomy N/A 01/27/2015    Procedure:  CHOLECYSTECTOMY;  Surgeon: Franky Macho, MD;  Location: [facility];  Service: General;  Laterality: N/A;  converted to open at 0941  . Esophagogastroduodenoscopy      [place]:PUD per patient  . Colonoscopy  2008    [facility], normal.   . Ercp N/A 02/08/2015    Procedure: ENDOSCOPIC RETROGRADE CHOLANGIOPANCREATOGRAPHY (ERCP);  Surgeon: Corbin Ade, MD;  Location: [facility];  Service: Endoscopy;  Laterality: N/A;  . Biliary stent placement N/A 02/08/2015    Procedure: BILIARY STENT PLACEMENT;  Surgeon: Corbin Ade, MD;  Location: [facility];  Service: Endoscopy;  Laterality: N/A;  . Sphincterotomy N/A 02/08/2015    Procedure: SPHINCTEROTOMY;  Surgeon: Corbin Ade, MD;  Location: [facility];  Service: Endoscopy;  Laterality: N/A;    Prior to Admission medications   Medication Sig Start Date End Date Taking? Authorizing Provider  cloNIDine (CATAPRES) 0.2 MG tablet Take 1 tablet (0.2 mg total) by mouth 2 (two) times daily. 01/30/15  Yes Franky Macho, MD  diphenhydramine-acetaminophen (TYLENOL PM) 25-500 MG TABS tablet Take 1 tablet by mouth at bedtime as needed (sleep,pain).   Yes Historical Provider, MD  docusate sodium (COLACE) 100 MG capsule Take 100 mg by mouth 2 (two) times daily.   Yes Historical Provider, MD  escitalopram (LEXAPRO) 10 MG tablet Take 10 mg by  mouth daily.   Yes Historical Provider, MD  metoprolol (LOPRESSOR) 50 MG tablet Take 50 mg by mouth 3 (three) times daily.   Yes Historical Provider, MD  Multiple Vitamins-Calcium (ONE-A-DAY WOMENS FORMULA PO) Take 1 tablet by mouth daily.   Yes Historical Provider, MD  Multiple Vitamins-Minerals (HAIR/SKIN/NAILS PO) Take 1 tablet by mouth daily.   Yes Historical Provider, MD  omeprazole (PRILOSEC) 40 MG capsule Take 40 mg by mouth daily.   Yes Historical Provider, MD  ondansetron (ZOFRAN) 8 MG tablet Take 1 tablet (8 mg total) by mouth every 4 (four) hours as needed. Patient not taking: Reported on 02/17/2015 01/22/15   Donnetta Hutching, MD   oxyCODONE-acetaminophen (PERCOCET) 7.5-325 MG tablet Take 1-2 tablets by mouth every 4 (four) hours as needed. Patient not taking: Reported on 02/17/2015 01/30/15   Franky Macho, MD    Scheduled Meds: . ciprofloxacin  200 mg Intravenous Q12H  . cloNIDine  0.1 mg Oral BID  . docusate sodium  100 mg Oral BID  . escitalopram  10 mg Oral Daily  . heparin  5,000 Units Subcutaneous 3 times per day  . metoprolol  12.5 mg Oral BID  . metronidazole  500 mg Intravenous Q8H  . pantoprazole  40 mg Oral Daily  . sodium chloride  3 mL Intravenous Q12H   Continuous Infusions: . sodium chloride 100 mL/hr at 02/18/15 0717   PRN Meds:.hydrALAZINE, morphine injection, ondansetron (ZOFRAN) IV, promethazine  Allergies as of 02/17/2015 - Review Complete 02/17/2015  Allergen Reaction Noted  . Hydrocodone-acetaminophen Nausea And Vomiting 02/17/2015    Family History  Problem Relation Age of Onset  . CAD Father     CABG  . Heart attack Brother     PTCA  . Hypertension Sister   . Hypertension Father   . Colon cancer Neg Hx   . Liver disease Neg Hx     Social History   Social History  . Marital Status: Legally Separated    Spouse Name: N/A  . Number of Children: 2  . Years of Education: N/A   Occupational History  . Nutritionist    Social History Main Topics  . Smoking status: Former Smoker    Types: Cigarettes  . Smokeless tobacco: Not on file  . Alcohol Use: No  . Drug Use: No  . Sexual Activity: No   Other Topics Concern  . Not on file   Social History Narrative   Lives alone. Moved from [place] 2 years ago.     Review of Systems: All negative except as stated above in HPI.  Physical Exam: Vital signs: Filed Vitals:   02/18/15 0857  BP: 171/58  Pulse: 85  Temp: 98.2 F (36.8 C)  Resp: 18   Last BM Date: 02/17/15 General:   Alert,  Well-developed, well-nourished, pleasant and cooperative in NAD Head: atraumatic Eyes: anicteric sclera ENT: oropharynx  clear Neck: supple, nontender Lungs:  Clear throughout to auscultation.   No wheezes, crackles, or rhonchi. No acute distress. Heart:  Regular rate and rhythm; no murmurs, clicks, rubs,  or gallops. Abdomen: RUQ and epigastric tenderness with guarding, soft, nondistended, +BS, healing RUQ incision, bile draining from small abdominal wall site (RUQ)  Rectal:  Deferred Ext: pedal pulses intact, no edema Psych: normal mood, affect  GI:  Lab Results:  Recent Labs  02/17/15 1459 02/18/15 0825  WBC 11.7* 9.3  HGB 12.9 9.6*  HCT 38.7 29.6*  PLT 674* 482*   BMET  Recent Labs  02/17/15 1459  02/18/15 0825  NA 136 143  K 3.2* 3.7  CL 101 114*  CO2 20* 17*  GLUCOSE 127* 104*  BUN 45* 24*  CREATININE 2.22* 0.95  CALCIUM 10.3 9.1   LFT  Recent Labs  02/18/15 0825  PROT 6.9  ALBUMIN 2.8*  AST 45*  ALT 54  ALKPHOS 106  BILITOT 0.8   PT/INR No results for input(s): LABPROT, INR in the last 72 hours.   Studies/Results: Ct Abdomen Pelvis Wo Contrast  02/17/2015  CLINICAL DATA:  Recent cholecystectomy for gangrenous cholecystitis. Right upper quadrant abdominal pain. EXAM: CT ABDOMEN AND PELVIS WITHOUT CONTRAST TECHNIQUE: Multidetector CT imaging of the abdomen and pelvis was performed following the standard protocol without IV contrast. COMPARISON:  01/25/2015 FINDINGS: Lower chest: Patchy parenchymal densities in the left lower lobe are concerning for a small focus of aspiration or pneumonia. Evidence for coronary artery calcifications. Hepatobiliary: There is a nonmetallic biliary stent with the distal aspect in the duodenum. Cholecystectomy clips are noted. Vague low-density in the gallbladder fossa is suggestive for fluid. This fluid tracks anterior towards the abdominal wall. Tiny focus of gas near the gallbladder fossa. Three small calcific foci between the liver and right kidney upper pole are concerning for retained gallstones. These small calcifications are grouped  together and roughly measure up to 1 cm in size. Inflammatory changes along the right upper anterior abdominal wall related to recent surgery. Limited evaluation of the liver and gallbladder fossa without intravenous contrast. Pancreas: Normal appearance of the pancreas without duct dilatation or inflammation. Spleen: No acute abnormality to the spleen. Adrenals/Urinary Tract: Normal appearance of the adrenal glands. Normal appearance of both kidneys without stones or hydronephrosis. Normal appearance of the urinary bladder. Stomach/Bowel: Moderate stool burden throughout the colon without acute colonic inflammation. Normal appearance of small bowel without obstruction. There may be mild inflammation of the proximal duodenum adjacent to the liver. Vascular/Lymphatic: Atherosclerotic calcifications in the aorta and iliac arteries without aneurysm. No significant abdominal or pelvic lymphadenopathy. Reproductive: Uterus is surgically absent. Other: No significant free fluid. Musculoskeletal: No suspicious bone lesions. IMPRESSION: Post cholecystectomy. There is a small amount of fluid and gas in the gallbladder fossa with expected inflammatory changes from recent surgery. Nonmetallic biliary stent. Small calcifications between the liver and right kidney are suggestive for retained gallstones. Patchy densities in the left lower lobe are concerning for a small focus of pneumonia or aspiration pneumonitis. Electronically Signed   By: Richarda Overlie M.D.   On: 02/17/2015 17:26   Dg Chest 2 View  02/17/2015  CLINICAL DATA:  Fever, chest pain, shortness of breath. EXAM: CHEST  2 VIEW COMPARISON:  January 25, 2015. FINDINGS: The heart size and mediastinal contours are within normal limits. Both lungs are clear. No pneumothorax or pleural effusion is noted. The visualized skeletal structures are unremarkable. IMPRESSION: No active cardiopulmonary disease. Electronically Signed   By: Lupita Raider, M.D.   On: 02/17/2015 15:50     Impression/Plan: 59 yo woman with a postop bile leak and s/p a biliary stent 10 days ago that appears to be in position by CT scan. LFTs minimally elevated. Continues to have bile drainage from previous JP drain site and minimal fluid not amenable to replacing the JP drain. Need to determine if a bile leak is still present and if so where it is and HIDA scan is the next step to determine that. The biliary stent is a large sized stent and unless a stone is now in the  CBD causing obstruction of the stent that was not visible during the ERCP last week the leak should close with the stent in place. May need a repeat ERCP with stent exchange pending the HIDA scan results. D/W Dr. Derrell LollingIngram. NPO. Continue IV Abx. Supportive care. Will follow.    LOS: 1 day   Liboria Putnam C.  02/18/2015, 12:39 PM  Pager [phone]  If no answer or after 5 PM call [phone]

## 2015-02-19 ENCOUNTER — Encounter (HOSPITAL_COMMUNITY)
Admission: EM | Disposition: A | Discharge status: Home / Self Care | Payer: Self-pay | Source: Home / Self Care | Attending: Internal Medicine

## 2015-02-19 ENCOUNTER — Inpatient Hospital Stay (HOSPITAL_COMMUNITY): Payer: Medicare Other | Admitting: Anesthesiology

## 2015-02-19 ENCOUNTER — Encounter (HOSPITAL_COMMUNITY): Payer: Self-pay | Admitting: *Deleted

## 2015-02-19 DIAGNOSIS — R1011 Right upper quadrant pain: Secondary | ICD-10-CM | POA: Diagnosis present

## 2015-02-19 DIAGNOSIS — Y832 Surgical operation with anastomosis, bypass or graft as the cause of abnormal reaction of the patient, or of later complication, without mention of misadventure at the time of the procedure: Secondary | ICD-10-CM

## 2015-02-19 DIAGNOSIS — K838 Other specified diseases of biliary tract: Secondary | ICD-10-CM

## 2015-02-19 DIAGNOSIS — Z01810 Encounter for preprocedural cardiovascular examination: Secondary | ICD-10-CM

## 2015-02-19 DIAGNOSIS — K839 Disease of biliary tract, unspecified: Secondary | ICD-10-CM

## 2015-02-19 DIAGNOSIS — I10 Essential (primary) hypertension: Secondary | ICD-10-CM

## 2015-02-19 DIAGNOSIS — I2510 Atherosclerotic heart disease of native coronary artery without angina pectoris: Secondary | ICD-10-CM

## 2015-02-19 DIAGNOSIS — K929 Disease of digestive system, unspecified: Secondary | ICD-10-CM

## 2015-02-19 DIAGNOSIS — E43 Unspecified severe protein-calorie malnutrition: Secondary | ICD-10-CM | POA: Diagnosis present

## 2015-02-19 LAB — COMPREHENSIVE METABOLIC PANEL
ALT: 42 U/L (ref 14–54)
AST: 29 U/L (ref 15–41)
Albumin: 2.9 g/dL — ABNORMAL LOW (ref 3.5–5.0)
Alkaline Phosphatase: 107 U/L (ref 38–126)
Anion gap: 12 (ref 5–15)
BILIRUBIN TOTAL: 0.7 mg/dL (ref 0.3–1.2)
BUN: 6 mg/dL (ref 6–20)
CALCIUM: 9.3 mg/dL (ref 8.9–10.3)
CHLORIDE: 110 mmol/L (ref 101–111)
CO2: 22 mmol/L (ref 22–32)
Creatinine, Ser: 0.71 mg/dL (ref 0.44–1.00)
Glucose, Bld: 107 mg/dL — ABNORMAL HIGH (ref 65–99)
Potassium: 3.3 mmol/L — ABNORMAL LOW (ref 3.5–5.1)
Sodium: 144 mmol/L (ref 135–145)
Total Protein: 7 g/dL (ref 6.5–8.1)

## 2015-02-19 LAB — CBC
HEMATOCRIT: 32.1 % — AB (ref 36.0–46.0)
HEMOGLOBIN: 10.2 g/dL — AB (ref 12.0–15.0)
MCH: 28.7 pg (ref 26.0–34.0)
MCHC: 31.8 g/dL (ref 30.0–36.0)
MCV: 90.2 fL (ref 78.0–100.0)
Platelets: 433 10*3/uL — ABNORMAL HIGH (ref 150–400)
RBC: 3.56 MIL/uL — AB (ref 3.87–5.11)
RDW: 13.9 % (ref 11.5–15.5)
WBC: 10.1 10*3/uL (ref 4.0–10.5)

## 2015-02-19 LAB — LIPASE, BLOOD: LIPASE: 72 U/L — AB (ref 11–51)

## 2015-02-19 SURGERY — CANCELLED PROCEDURE

## 2015-02-19 MED ORDER — FENTANYL CITRATE (PF) 250 MCG/5ML IJ SOLN
INTRAMUSCULAR | Status: AC
  Filled 2015-02-19: qty 5

## 2015-02-19 MED ORDER — POTASSIUM CHLORIDE CRYS ER 20 MEQ PO TBCR
40.0000 meq | EXTENDED_RELEASE_TABLET | Freq: Once | ORAL | Status: AC
  Administered 2015-02-19: 40 meq via ORAL
  Filled 2015-02-19: qty 2

## 2015-02-19 MED ORDER — MIDAZOLAM HCL 2 MG/2ML IJ SOLN
INTRAMUSCULAR | Status: AC
  Filled 2015-02-19: qty 4

## 2015-02-19 MED ORDER — SUCCINYLCHOLINE CHLORIDE 20 MG/ML IJ SOLN
INTRAMUSCULAR | Status: AC
  Filled 2015-02-19: qty 1

## 2015-02-19 MED ORDER — INDOMETHACIN 50 MG RE SUPP
100.0000 mg | Freq: Once | RECTAL | Status: DC
  Filled 2015-02-19: qty 2

## 2015-02-19 MED ORDER — PROPOFOL 10 MG/ML IV BOLUS
INTRAVENOUS | Status: AC
  Filled 2015-02-19: qty 20

## 2015-02-19 MED ORDER — LACTATED RINGERS IV SOLN: INTRAVENOUS | Status: DC

## 2015-02-19 MED ORDER — LIDOCAINE HCL (CARDIAC) 20 MG/ML IV SOLN
INTRAVENOUS | Status: AC
  Filled 2015-02-19: qty 5

## 2015-02-19 MED ORDER — METOPROLOL TARTRATE 25 MG PO TABS
25.0000 mg | ORAL_TABLET | Freq: Two times a day (BID) | ORAL | Status: DC
  Administered 2015-02-19: 25 mg via ORAL
  Filled 2015-02-19 (×2): qty 1

## 2015-02-19 MED ORDER — AMLODIPINE BESYLATE 5 MG PO TABS
5.0000 mg | ORAL_TABLET | Freq: Every day | ORAL | Status: DC
  Administered 2015-02-19 – 2015-02-21 (×3): 5 mg via ORAL
  Filled 2015-02-19 (×3): qty 1

## 2015-02-19 NOTE — Progress Notes (Signed)
    She had inferior ischemia changes on EKG 10/28 - has had in past w/ neg stress tests. Troponin neg and EKG back to baseline.   Reviewed w/Dr. Jean RosenthalJackson of anesthesia and we have decided to cancel/postpone ERCP and get cardiology evaluation.  Will still need ERCP and stent change I think - have further discussed w/ Dr. Derrell LollingIngram and he says non-operative management way to go right now.  Depending upon things may need IR to replace drain also as already noted.  Iva Booparl E. Justino Boze, MD, Antionette FairyFACG [facility] [phone] (pager) 02/19/2015 1:09 PM

## 2015-02-19 NOTE — Anesthesia Preprocedure Evaluation (Addendum)
Anesthesia Evaluation  Patient identified by MRN, date of birth, ID band Patient awake    Reviewed: Allergy & Precautions, NPO status , Patient's Chart, lab work & pertinent test results  History of Anesthesia Complications Negative for: history of anesthetic complications  Airway Mallampati: II  TM Distance: >3 FB Neck ROM: Full    Dental  (+) Chipped, Dental Advisory Given   Pulmonary neg pulmonary ROS, former smoker,    breath sounds clear to auscultation       Cardiovascular hypertension, Pt. on medications and Pt. on home beta blockers + angina (pt reports chest discomfort when presented with abdominal pain/biliary leak to ED) + CAD, + Past MI and + Cardiac Stents   Rhythm:Regular Rate:Normal  02/17/15 ECG: inferior ST changes/ischemia, new since prior ECG 01/25/15 '15 Stress: EF 61%, no ischemia or infarct   Neuro/Psych negative neurological ROS     GI/Hepatic GERD  Medicated,Elevated LFTs Acute cholecystitis, s/p cholecystectomy complicated by acute bile leak   Endo/Other  SLE  Renal/GU ARFRenal disease (resolving)     Musculoskeletal   Abdominal   Peds  Hematology  (+) Blood dyscrasia (Hb 10.2), ,   Anesthesia Other Findings   Reproductive/Obstetrics                         Anesthesia Physical Anesthesia Plan  ASA: III  Anesthesia Plan:    Post-op Pain Management:    Induction:   Airway Management Planned:   Additional Equipment:   Intra-op Plan:   Post-operative Plan:   Informed Consent:   Plan Discussed with: CRNA and Surgeon  Anesthesia Plan Comments: (Discussed with Dr. Leone PayorGessner and patient.  Inferior ECG changes from 2 days ago were associated with chest pressure, new from prior ECG. Interesting that the inferior changes were also present 11/15, precipitating stress test, which was normal.  Given that pt does seem to have consistent inferior area of myocardium at  risk, Dr. Leone PayorGessner agrees to have cardiology eval pt prior to elective procedure.   Postpone pending cards eval  )        Anesthesia Quick Evaluation

## 2015-02-19 NOTE — Progress Notes (Signed)
   Patient Name: Sheila [name] Date of Encounter: 02/19/2015, 9:29 AM    Subjective  On ERCP consult call - case reviewed w/ Dr. Bosie ClosSchooler yesterday She feels stronger since admit Still leaking bilious fluid from drain hole RUQ still having some pain in abdomen   Objective  BP 194/75 mmHg  Pulse 82  Temp(Src) 98.2 F (36.8 C) (Oral)  Resp 18  Ht 5\' 1"  (1.549 m)  Wt 131 lb 11.2 oz (59.739 kg)  BMI 24.90 kg/m2  SpO2 98% @BP  194/75 mmHg  Pulse 82  Temp(Src) 98.2 F (36.8 C) (Oral)  Resp 18  Ht 5\' 1"  (1.549 m)  Wt 131 lb 11.2 oz (59.739 kg)  BMI 24.90 kg/m2  SpO2 98%@  General:  NAD Eyes:   anicteric Lungs:  clear Heart:: S1S2 no rubs, murmurs or gallops Abdomen:  soft and mildly tender RUQ - with weeping fistula RUQ Ext:   no edema, cyanosis or clubbing    Data Reviewed:   Prior ERCP images, CT scans, HIDA and MR/MRCP images with radiologist Labs in chart  MRCP Status post cholecystectomy. No intrahepatic or extrahepatic ductal dilatation. Common duct measures 5 mm. No choledocholithiasis is seen.  1.6 x 2.6 cm complex fluid collection/biloma in the gallbladder fossa, likely corresponding to the known bile leak at the cystic duct remnant/stump.  Drug drainage of the collection to the skin surface deviated a catheter tract along the right anterior abdomen and exiting the right lateral abdominal wall.     Assessment and Plan  Post-operative bile leak , RUQ PAIN- cannot tell if just at cystic duct stump or if there was some aberrant anatomy and another duct was injured and leaking from what we have so foar  It is unusual for stents to occlude quickly but it can happen. She has also had a sphincterotomy so that should aid drainage and usually fix a leak by altering pressure gradient.  My plan is to remove existing stent - cholangiogram to better evaluate anatomy and leak, and replace stent with longer stent though that is not usually needed.  If  this fails to control leak then she will at least need a percutaneous drain and possibly surgery depending upon what is going on with the bile duct(s).  The risks and benefits as well as alternatives of endoscopic procedure(s) have been discussed and reviewed. All questions answered. The patient agrees to proceed.    Iva Booparl E. Bayla Mcgovern, MD, Antionette FairyFACG [facility] [phone]) 02/19/2015 9:29 AM

## 2015-02-19 NOTE — Consult Note (Signed)
WOC wound consult note Reason for Consult: Need pouch for draining wound Wound type:drainage is bile (bilious) Pressure Ulcer POA:No Measurement:seen through recently applied pouch, <0.4cm round Wound GNF:AOZHbed:None Drainage (amount, consistency, odor) thick, yellow bilious material Periwound:intact per RN Dressing procedure/placement/frequency: I am in agreement that a fistula pouch will be superior to a standard convex ostomy pouching system from both a patient comfort and durability of skin barrier standpoint, but also for ease in emptying as the "tail" is considerably longer and without a hard plastic adapter.  I have provided orders and a supply order number so that acquisition of this [place] pouch is easily accomplished. WOC nursing team will not follow, but will remain available to this patient, the nursing, surgical and medical teams.  Please re-consult if needed. Thanks, Ladona MowLaurie Jahsir Rama, MSN, RN, GNP, Hans EdenCWOCN, CWON-[facility], FAAN  Pager# [phone]

## 2015-02-19 NOTE — Progress Notes (Addendum)
Gen. Surgery:   Hepatobiliary scan shows ongoing leak from cystic duct stump area. Patient remained stable clinically  Algorithm for care discussed with Dr. Leone PayorGessner. Agree with ERCP, removal of short plastic stent and possibly replacement with a longer stent to go up past the leak.  Hopefully we will get a better cholangiogram and better definition of the anatomy of the leak.  If she continues to drain would recommend interventional radiology place a wire through the draining tract and try  to place a drain in the subhepatic space.  Would leave Trans-ampullary stent and subhepatic drains for many months.  Would hold off on any percutaneous biliary procedures due to normal caliber intrahepatic bile ducts. She is 3 weeks postop open cholecystectomy for gangrenous cholecystitis, so re-exploration for control of leak is contraindicated except in life-threatening circumstances. Hostile abdomen.  Sheila [name], M.D., [facility] General and Minimally invasive Surgery Breast and Colorectal Surgery Office:   [phone] Pager:   [phone]

## 2015-02-19 NOTE — Progress Notes (Signed)
[facility] Progress Note  Day of Surgery  Subjective: In good spirits.  Pt has minor pain and nausea.  She's still draining a lot from her side where the JP was previously.  Pending ERCP.  Last BM yesterday.  Having flatus and urinating well.  Mouth is parched due to being NPO.  No fistula bag was able to be placed because WOC not her on weekends.  RN ordering bag today.  Objective: Vital signs in last 24 hours: Temp:  [98.2 F (36.8 C)-98.3 F (36.8 C)] 98.2 F (36.8 C) (10/30 0504) Pulse Rate:  [73-82] 82 (10/30 0504) Resp:  [17-18] 18 (10/30 0504) BP: (166-194)/(62-75) 194/75 mmHg (10/30 0504) SpO2:  [98 %-99 %] 98 % (10/30 0504) Weight:  [59.739 kg (131 lb 11.2 oz)] 59.739 kg (131 lb 11.2 oz) (10/29 2143) Last BM Date: 02/18/15  Intake/Output from previous day: 10/29 0701 - 10/30 0700 In: 1560 [P.O.:60; I.V.:1200; IV Piggyback:300] Out: 1200 [Urine:1200] Intake/Output this shift: Total I/O In: 0  Out: 500 [Urine:500]  PE: Gen:  Alert, NAD, pleasant Abd: Soft, mild tenderness, ND, +BS, no HSM, incisions C/D/I, old drain site in RUQ with bilious drainage, no fistula bag placed on patient yet.     Lab Results:   Recent Labs  02/18/15 0825 02/19/15 0832  WBC 9.3 10.1  HGB 9.6* 10.2*  HCT 29.6* 32.1*  PLT 482* 433*   BMET  Recent Labs  02/18/15 0825 02/19/15 0832  NA 143 144  K 3.7 3.3*  CL 114* 110  CO2 17* 22  GLUCOSE 104* 107*  BUN 24* 6  CREATININE 0.95 0.71  CALCIUM 9.1 9.3   PT/INR No results for input(s): LABPROT, INR in the last 72 hours. CMP     Component Value Date/Time   NA 144 02/19/2015 0832   K 3.3* 02/19/2015 0832   CL 110 02/19/2015 0832   CO2 22 02/19/2015 0832   GLUCOSE 107* 02/19/2015 0832   BUN 6 02/19/2015 0832   CREATININE 0.71 02/19/2015 0832   CALCIUM 9.3 02/19/2015 0832   PROT 7.0 02/19/2015 0832   ALBUMIN 2.9* 02/19/2015 0832   AST 29 02/19/2015 0832   ALT 42 02/19/2015 0832   ALKPHOS 107 02/19/2015  0832   BILITOT 0.7 02/19/2015 0832   GFRNONAA >60 02/19/2015 0832   GFRAA >60 02/19/2015 0832   Lipase     Component Value Date/Time   LIPASE 72* 02/19/2015 0832       Studies/Results: Ct Abdomen Pelvis Wo Contrast  02/17/2015  CLINICAL DATA:  Recent cholecystectomy for gangrenous cholecystitis. Right upper quadrant abdominal pain. EXAM: CT ABDOMEN AND PELVIS WITHOUT CONTRAST TECHNIQUE: Multidetector CT imaging of the abdomen and pelvis was performed following the standard protocol without IV contrast. COMPARISON:  01/25/2015 FINDINGS: Lower chest: Patchy parenchymal densities in the left lower lobe are concerning for a small focus of aspiration or pneumonia. Evidence for coronary artery calcifications. Hepatobiliary: There is a nonmetallic biliary stent with the distal aspect in the duodenum. Cholecystectomy clips are noted. Vague low-density in the gallbladder fossa is suggestive for fluid. This fluid tracks anterior towards the abdominal wall. Tiny focus of gas near the gallbladder fossa. Three small calcific foci between the liver and right kidney upper pole are concerning for retained gallstones. These small calcifications are grouped together and roughly measure up to 1 cm in size. Inflammatory changes along the right upper anterior abdominal wall related to recent surgery. Limited evaluation of the liver and gallbladder fossa without intravenous contrast.  Pancreas: Normal appearance of the pancreas without duct dilatation or inflammation. Spleen: No acute abnormality to the spleen. Adrenals/Urinary Tract: Normal appearance of the adrenal glands. Normal appearance of both kidneys without stones or hydronephrosis. Normal appearance of the urinary bladder. Stomach/Bowel: Moderate stool burden throughout the colon without acute colonic inflammation. Normal appearance of small bowel without obstruction. There may be mild inflammation of the proximal duodenum adjacent to the liver.  Vascular/Lymphatic: Atherosclerotic calcifications in the aorta and iliac arteries without aneurysm. No significant abdominal or pelvic lymphadenopathy. Reproductive: Uterus is surgically absent. Other: No significant free fluid. Musculoskeletal: No suspicious bone lesions. IMPRESSION: Post cholecystectomy. There is a small amount of fluid and gas in the gallbladder fossa with expected inflammatory changes from recent surgery. Nonmetallic biliary stent. Small calcifications between the liver and right kidney are suggestive for retained gallstones. Patchy densities in the left lower lobe are concerning for a small focus of pneumonia or aspiration pneumonitis. Electronically Signed   By: Richarda OverlieAdam  Henn M.D.   On: 02/17/2015 17:26   Dg Chest 2 View  02/17/2015  CLINICAL DATA:  Fever, chest pain, shortness of breath. EXAM: CHEST  2 VIEW COMPARISON:  January 25, 2015. FINDINGS: The heart size and mediastinal contours are within normal limits. Both lungs are clear. No pneumothorax or pleural effusion is noted. The visualized skeletal structures are unremarkable. IMPRESSION: No active cardiopulmonary disease. Electronically Signed   By: Lupita RaiderJames  Green Jr, M.D.   On: 02/17/2015 15:50   Nm Hepatobiliary Including Gb  02/18/2015  CLINICAL DATA:  Status post cholecystectomy, evaluate for bile leak EXAM: NUCLEAR MEDICINE HEPATOBILIARY IMAGING TECHNIQUE: Sequential images of the abdomen were obtained out to 60 minutes following intravenous administration of radiopharmaceutical. RADIOPHARMACEUTICALS:  5.3 mCi Tc-822m  Choletec IV COMPARISON:  CT abdomen pelvis dated 02/17/2015 FINDINGS: Normal hepatic excretion. Within 15 minutes, radiotracer begins to pool along the inferior liver margin. This is clearly extraluminal and reflects a bile leak. Study was aborted after 45 minutes. IMPRESSION: Study is positive for bile leak. These results will be called to the ordering clinician or representative by the Radiologist Assistant, and  communication documented in the PACS or zVision Dashboard. Electronically Signed   By: Charline BillsSriyesh  Krishnan M.D.   On: 02/18/2015 14:06   Mr 3d Recon At Scanner  02/18/2015  CLINICAL DATA:  Status post laparoscopic cholecystectomy on 01/27/2015, bile leak via surgical wound, pain/tenderness EXAM: MRI ABDOMEN WITHOUT AND WITH CONTRAST (INCLUDING MRCP) TECHNIQUE: Multiplanar multisequence MR imaging of the abdomen was performed both before and after the administration of intravenous contrast. Heavily T2-weighted images of the biliary and pancreatic ducts were obtained, and three-dimensional MRCP images were rendered by post processing. CONTRAST:  12mL MULTIHANCE GADOBENATE DIMEGLUMINE 529 MG/ML IV SOLN COMPARISON:  Nuclear medicine hepatobiliary scan dated 02/18/2015. CT abdomen pelvis dated 02/09/2015. ERCP dated 02/08/2015. FINDINGS: Lower chest: Mild patchy opacity at the left lung base (series 7/image 6). Cardiomegaly. Hepatobiliary: Liver is notable for mild hepatic steatosis. No suspicious/enhancing hepatic lesions. Mild increased perfusion along the gallbladder fossa. Status post cholecystectomy. No intrahepatic or extrahepatic ductal dilatation. Common duct measures 5 mm (series 6/image 24). No choledocholithiasis is seen. 1.6 x 2.6 cm complex fluid collection with layering fluid-fluid level in the gallbladder fossa (series 8/image 21), likely reflecting a biloma. This collection is adjacent to susceptibility artifact from a surgical clips (series 8/image 20), likely corresponding to the known bile leak at the cystic duct remnant/stump. Direct drainage of the collection to the skin surface via a catheter tract  along the right anterior abdomen and exiting the right lateral abdominal wall (series 8/images 24, 27, and 35). Pancreas: Within normal limits. Spleen: Within normal limits. Adrenals/Urinary Tract: Adrenal glands are within normal limits. Kidneys within normal limits.  No hydronephrosis. Stomach/Bowel:  Stomach is within normal limits. Visualized bowel is unremarkable. Vascular/Lymphatic: No evidence of abdominal aortic aneurysm. No suspicious abdominal lymphadenopathy. Other: No abdominal ascites. Musculoskeletal: No focal osseous lesions. IMPRESSION: Status post cholecystectomy. No intrahepatic or extrahepatic ductal dilatation. Common duct measures 5 mm. No choledocholithiasis is seen. 1.6 x 2.6 cm complex fluid collection/biloma in the gallbladder fossa, likely corresponding to the known bile leak at the cystic duct remnant/stump. Drug drainage of the collection to the skin surface deviated a catheter tract along the right anterior abdomen and exiting the right lateral abdominal wall. Additional ancillary findings as above. Electronically Signed   By: Charline Bills M.D.   On: 02/18/2015 19:43   Mr Abd W/wo Cm/mrcp  02/18/2015  CLINICAL DATA:  Status post laparoscopic cholecystectomy on 01/27/2015, bile leak via surgical wound, pain/tenderness EXAM: MRI ABDOMEN WITHOUT AND WITH CONTRAST (INCLUDING MRCP) TECHNIQUE: Multiplanar multisequence MR imaging of the abdomen was performed both before and after the administration of intravenous contrast. Heavily T2-weighted images of the biliary and pancreatic ducts were obtained, and three-dimensional MRCP images were rendered by post processing. CONTRAST:  12mL MULTIHANCE GADOBENATE DIMEGLUMINE 529 MG/ML IV SOLN COMPARISON:  Nuclear medicine hepatobiliary scan dated 02/18/2015. CT abdomen pelvis dated 02/09/2015. ERCP dated 02/08/2015. FINDINGS: Lower chest: Mild patchy opacity at the left lung base (series 7/image 6). Cardiomegaly. Hepatobiliary: Liver is notable for mild hepatic steatosis. No suspicious/enhancing hepatic lesions. Mild increased perfusion along the gallbladder fossa. Status post cholecystectomy. No intrahepatic or extrahepatic ductal dilatation. Common duct measures 5 mm (series 6/image 24). No choledocholithiasis is seen. 1.6 x 2.6 cm complex  fluid collection with layering fluid-fluid level in the gallbladder fossa (series 8/image 21), likely reflecting a biloma. This collection is adjacent to susceptibility artifact from a surgical clips (series 8/image 20), likely corresponding to the known bile leak at the cystic duct remnant/stump. Direct drainage of the collection to the skin surface via a catheter tract along the right anterior abdomen and exiting the right lateral abdominal wall (series 8/images 24, 27, and 35). Pancreas: Within normal limits. Spleen: Within normal limits. Adrenals/Urinary Tract: Adrenal glands are within normal limits. Kidneys within normal limits.  No hydronephrosis. Stomach/Bowel: Stomach is within normal limits. Visualized bowel is unremarkable. Vascular/Lymphatic: No evidence of abdominal aortic aneurysm. No suspicious abdominal lymphadenopathy. Other: No abdominal ascites. Musculoskeletal: No focal osseous lesions. IMPRESSION: Status post cholecystectomy. No intrahepatic or extrahepatic ductal dilatation. Common duct measures 5 mm. No choledocholithiasis is seen. 1.6 x 2.6 cm complex fluid collection/biloma in the gallbladder fossa, likely corresponding to the known bile leak at the cystic duct remnant/stump. Drug drainage of the collection to the skin surface deviated a catheter tract along the right anterior abdomen and exiting the right lateral abdominal wall. Additional ancillary findings as above. Electronically Signed   By: Charline Bills M.D.   On: 02/18/2015 19:43    Anti-infectives: Anti-infectives    Start     Dose/Rate Route Frequency Ordered Stop   02/17/15 2000  ciprofloxacin (CIPRO) IVPB 200 mg     200 mg 100 mL/hr over 60 Minutes Intravenous Every 12 hours 02/17/15 1911     02/17/15 1915  metroNIDAZOLE (FLAGYL) IVPB 500 mg     500 mg 100 mL/hr over 60 Minutes Intravenous Every  8 hours 02/17/15 1911         Assessment/Plan Postoperative bile leak at cystic duct remnant/stump. s/p open chole  Dr. Lovell Sheehan, s/p JP drain removal in office There is not enough fluid in the abdomen for interventional radiology to place a drain. Drainage is emptying out through skin where previous JP was in large amounts Nothing by mouth Continue Cipro and Flagyl Discussed with RN about getting a fistula bag and barrier ring to put around this fistula to control drainage, protect skin, and quantitate drainage Treatment plan coordinated with gastroenterology service May need transhepatic drainage if ERCP not successful, but intrahepatic ducts may not be large enough to pass catheter through No plans for surgical intervention at this time  Status post recent open cholecystectomy for gangrenous cholecystitis(01/27/2015). Some difficulty in controlling cystic duct reported by surgeon. No retained stones seen on MRCP F/u with Dr. Lovell Sheehan upon discharge  Status post recent ERCP with stent placement.(02/08/2015) Stent appears to be in proper position. Question whether stent is working or is occluded. Dr. Council Mechanic. Leone Payor to assess.  ERCP planned with cholangiogram, and possible new stent placement.  History myocardial infarction, stent placed in mid right RCA 2012,  Systemic lupus Anxiety  Hypertension  Peptic ulcer disease.  Status post open cholecystectomy. (recent) Status post abdominal hysterectomy.    LOS: 2 days    Nonie Hoyer 02/19/2015, 10:37 AM Pager: [phone]

## 2015-02-19 NOTE — Consult Note (Addendum)
Admit date: 02/17/2015 Referring Physician  Dr. Isidoro Donning Primary Physician  Dr. Renaye Rakers Primary Cardiologist  Dr. Simona Huh and Arthur Holms, MD [facility] [place], [place] Reason for Consultation  Preoperative cardiac clearance  HPI: Sheila [name] is a 59 y.o.female with history of CAD and prior stent to mid RCA in 2012, and stents to unknown arteries in 2013, followed by [facility] in [place] (last seen in June of 2015), hypertension, Lupus, with recent hospitalization at [facility], underwent open cholecystectomy on 01/27/2015, and was found to have gangrenous gallbladder and empyema.  She had JP drain placed in subhepatic space  due to the necrotic nature of the cystic duct.  She continued to have significant output, underwent ERCP on 02/07/2015 with biliary stent placement and was seen yesterday by surgery and had JP drain discontinued.  The patient presented to the ER with complaints of abdominal pain, nausea vomiting constant during the entire 3 weeks and had  worsened over the last 2 weeks and was sent to ED by her PCP today.   Workup in ED was significant for worsening LFTs, and acute renal failure with creatinine of 2.2, mild leukocytosis of 11.7, had CT abdomen pelvis done which did show fluid collection in gallbladder fossa tracking anteriorly.  She was admitted by [facility].  She was felt to have a biliary leak and was continue on antibx.  ERCP was planned to remove short stent and possibly place a longer stent. She was noted to have an abnormal EKG on admission with ST depression in the inferior leads.  She denies any chest pain or pressure and no SOB prior to 5 weeks ago before her gallbladder problems.  Since her cholecystectomy, she has had intermittent pressure in the left upper chest that she says is similar to her prior angina and can come on at any time.  She attributed it to her recent abdominal pain with N/V.  Troponin is negative.  Given prior history of CAD, Cardiology is  asked to give preop cardiac clearance.        PMH:   Past Medical History  Diagnosis Date  . Myocardial infarction (HCC) 2012   . CVD (cardiovascular disease)     Stent to mid RCA 2012 [name] 2.5 X 12 mm  . Systemic lupus (HCC)   . Anxiety   . Hypertension   . Arthritis   . Hypercholesterolemia   . PUD (peptic ulcer disease)     Dr. in [place] in early 2016     PSH:   Past Surgical History  Procedure Laterality Date  . Coronary angioplasty with stent placement  2012, 2013.    [name] DES to mid RCA. Stents X 2 to unknown artery  . Abdominal hysterectomy    . Tubal ligation    . Breast lumpectomy    . Cholecystectomy N/A 01/27/2015    Procedure: CHOLECYSTECTOMY;  Surgeon: Franky Macho, MD;  Location: [facility];  Service: General;  Laterality: N/A;  converted to open at 0941  . Esophagogastroduodenoscopy      [place]:PUD per patient  . Colonoscopy  2008    [facility], normal.   . Ercp N/A 02/08/2015    Procedure: ENDOSCOPIC RETROGRADE CHOLANGIOPANCREATOGRAPHY (ERCP);  Surgeon: Corbin Ade, MD;  Location: [facility];  Service: Endoscopy;  Laterality: N/A;  . Biliary stent placement N/A 02/08/2015    Procedure: BILIARY STENT PLACEMENT;  Surgeon: Corbin Ade, MD;  Location: [facility];  Service: Endoscopy;  Laterality: N/A;  .  Sphincterotomy N/A 02/08/2015    Procedure: SPHINCTEROTOMY;  Surgeon: Corbin Ade, MD;  Location: [facility];  Service: Endoscopy;  Laterality: N/A;    Allergies:  Hydrocodone-acetaminophen Prior to Admit Meds:   Prescriptions prior to admission  Medication Sig Dispense Refill Last Dose  . cloNIDine (CATAPRES) 0.2 MG tablet Take 1 tablet (0.2 mg total) by mouth 2 (two) times daily. 60 tablet 1 02/16/2015 at Unknown time  . diphenhydramine-acetaminophen (TYLENOL PM) 25-500 MG TABS tablet Take 1 tablet by mouth at bedtime as needed (sleep,pain).   Past Week at Unknown time  . docusate sodium (COLACE) 100 MG capsule Take 100 mg by mouth 2 (two) times daily.    02/16/2015 at Unknown time  . escitalopram (LEXAPRO) 10 MG tablet Take 10 mg by mouth daily.   02/16/2015 at Unknown time  . metoprolol (LOPRESSOR) 50 MG tablet Take 50 mg by mouth 3 (three) times daily.   02/16/2015 at 800  . Multiple Vitamins-Calcium (ONE-A-DAY WOMENS FORMULA PO) Take 1 tablet by mouth daily.   over 30 days  . Multiple Vitamins-Minerals (HAIR/SKIN/NAILS PO) Take 1 tablet by mouth daily.   over 30 days  . omeprazole (PRILOSEC) 40 MG capsule Take 40 mg by mouth daily.   1 week ago  . ondansetron (ZOFRAN) 8 MG tablet Take 1 tablet (8 mg total) by mouth every 4 (four) hours as needed. (Patient not taking: Reported on 02/17/2015) 10 tablet 0 Not Taking at Unknown time  . oxyCODONE-acetaminophen (PERCOCET) 7.5-325 MG tablet Take 1-2 tablets by mouth every 4 (four) hours as needed. (Patient not taking: Reported on 02/17/2015) 50 tablet 0 Not Taking at Unknown time   Fam HX:    Family History  Problem Relation Age of Onset  . CAD Father     CABG  . Heart attack Brother     PTCA  . Hypertension Sister   . Hypertension Father   . Colon cancer Neg Hx   . Liver disease Neg Hx    Social HX:    Social History   Social History  . Marital Status: Legally Separated    Spouse Name: N/A  . Number of Children: 2  . Years of Education: N/A   Occupational History  . Nutritionist    Social History Main Topics  . Smoking status: Former Smoker    Types: Cigarettes  . Smokeless tobacco: Not on file  . Alcohol Use: No  . Drug Use: No  . Sexual Activity: No   Other Topics Concern  . Not on file   Social History Narrative   Lives alone. Moved from [place] 2 years ago.      ROS:  All 11 ROS were addressed and are negative except what is stated in the HPI  Physical Exam: Blood pressure 227/83, pulse 91, temperature 98.8 F (37.1 C), temperature source Oral, resp. rate 18, height  (1.549 m), weight 131 lb 11.2 oz (59.739 kg), SpO2 100 %.    General: Well developed, well  nourished, in no acute distress Head: Eyes PERRLA, No xanthomas.   Normal cephalic and atramatic  Lungs:   Clear bilaterally to auscultation and percussion. Heart:   HRRR S1 S2 Pulses are 2+ & equal.            No carotid bruit. No JVD.  No abdominal bruits. No femoral bruits. Abdomen: Bowel sounds are positive, abdomen soft and non-tender without masses  Extremities:   No clubbing, cyanosis or edema.  DP +1 Neuro: Alert  and oriented X 3. Psych:  Good affect, responds appropriately    Labs:   Lab Results  Component Value Date   WBC 10.1 02/19/2015   HGB 10.2* 02/19/2015   HCT 32.1* 02/19/2015   MCV 90.2 02/19/2015   PLT 433* 02/19/2015    Recent Labs Lab 02/19/15 0832  NA 144  K 3.3*  CL 110  CO2 22  BUN 6  CREATININE 0.71  CALCIUM 9.3  PROT 7.0  BILITOT 0.7  ALKPHOS 107  ALT 42  AST 29  GLUCOSE 107*   No results found for: PTT No results found for: INR, PROTIME Lab Results  Component Value Date   TROPONINI <0.30 03/21/2014     Lab Results  Component Value Date   CHOL 227* 03/21/2014   Lab Results  Component Value Date   HDL 62 03/21/2014   Lab Results  Component Value Date   LDLCALC 145* 03/21/2014   Lab Results  Component Value Date   TRIG 98 03/21/2014   Lab Results  Component Value Date   CHOLHDL 3.7 03/21/2014   No results found for: LDLDIRECT    Radiology:  Ct Abdomen Pelvis Wo Contrast  02/17/2015  CLINICAL DATA:  Recent cholecystectomy for gangrenous cholecystitis. Right upper quadrant abdominal pain. EXAM: CT ABDOMEN AND PELVIS WITHOUT CONTRAST TECHNIQUE: Multidetector CT imaging of the abdomen and pelvis was performed following the standard protocol without IV contrast. COMPARISON:  01/25/2015 FINDINGS: Lower chest: Patchy parenchymal densities in the left lower lobe are concerning for a small focus of aspiration or pneumonia. Evidence for coronary artery calcifications. Hepatobiliary: There is a nonmetallic biliary stent with the distal  aspect in the duodenum. Cholecystectomy clips are noted. Vague low-density in the gallbladder fossa is suggestive for fluid. This fluid tracks anterior towards the abdominal wall. Tiny focus of gas near the gallbladder fossa. Three small calcific foci between the liver and right kidney upper pole are concerning for retained gallstones. These small calcifications are grouped together and roughly measure up to 1 cm in size. Inflammatory changes along the right upper anterior abdominal wall related to recent surgery. Limited evaluation of the liver and gallbladder fossa without intravenous contrast. Pancreas: Normal appearance of the pancreas without duct dilatation or inflammation. Spleen: No acute abnormality to the spleen. Adrenals/Urinary Tract: Normal appearance of the adrenal glands. Normal appearance of both kidneys without stones or hydronephrosis. Normal appearance of the urinary bladder. Stomach/Bowel: Moderate stool burden throughout the colon without acute colonic inflammation. Normal appearance of small bowel without obstruction. There may be mild inflammation of the proximal duodenum adjacent to the liver. Vascular/Lymphatic: Atherosclerotic calcifications in the aorta and iliac arteries without aneurysm. No significant abdominal or pelvic lymphadenopathy. Reproductive: Uterus is surgically absent. Other: No significant free fluid. Musculoskeletal: No suspicious bone lesions. IMPRESSION: Post cholecystectomy. There is a small amount of fluid and gas in the gallbladder fossa with expected inflammatory changes from recent surgery. Nonmetallic biliary stent. Small calcifications between the liver and right kidney are suggestive for retained gallstones. Patchy densities in the left lower lobe are concerning for a small focus of pneumonia or aspiration pneumonitis. Electronically Signed   By: Richarda Overlie M.D.   On: 02/17/2015 17:26   Dg Chest 2 View  02/17/2015  CLINICAL DATA:  Fever, chest pain, shortness  of breath. EXAM: CHEST  2 VIEW COMPARISON:  January 25, 2015. FINDINGS: The heart size and mediastinal contours are within normal limits. Both lungs are clear. No pneumothorax or pleural effusion is noted. The  visualized skeletal structures are unremarkable. IMPRESSION: No active cardiopulmonary disease. Electronically Signed   By: Lupita Raider, M.D.   On: 02/17/2015 15:50   Nm Hepatobiliary Including Gb  02/18/2015  CLINICAL DATA:  Status post cholecystectomy, evaluate for bile leak EXAM: NUCLEAR MEDICINE HEPATOBILIARY IMAGING TECHNIQUE: Sequential images of the abdomen were obtained out to 60 minutes following intravenous administration of radiopharmaceutical. RADIOPHARMACEUTICALS:  5.3 mCi Tc-26m  Choletec IV COMPARISON:  CT abdomen pelvis dated 02/17/2015 FINDINGS: Normal hepatic excretion. Within 15 minutes, radiotracer begins to pool along the inferior liver margin. This is clearly extraluminal and reflects a bile leak. Study was aborted after 45 minutes. IMPRESSION: Study is positive for bile leak. These results will be called to the ordering clinician or representative by the Radiologist Assistant, and communication documented in the PACS or zVision Dashboard. Electronically Signed   By: Charline Bills M.D.   On: 02/18/2015 14:06   Mr 3d Recon At Scanner  02/18/2015  CLINICAL DATA:  Status post laparoscopic cholecystectomy on 01/27/2015, bile leak via surgical wound, pain/tenderness EXAM: MRI ABDOMEN WITHOUT AND WITH CONTRAST (INCLUDING MRCP) TECHNIQUE: Multiplanar multisequence MR imaging of the abdomen was performed both before and after the administration of intravenous contrast. Heavily T2-weighted images of the biliary and pancreatic ducts were obtained, and three-dimensional MRCP images were rendered by post processing. CONTRAST:  12mL MULTIHANCE GADOBENATE DIMEGLUMINE 529 MG/ML IV SOLN COMPARISON:  Nuclear medicine hepatobiliary scan dated 02/18/2015. CT abdomen pelvis dated 02/09/2015.  ERCP dated 02/08/2015. FINDINGS: Lower chest: Mild patchy opacity at the left lung base (series 7/image 6). Cardiomegaly. Hepatobiliary: Liver is notable for mild hepatic steatosis. No suspicious/enhancing hepatic lesions. Mild increased perfusion along the gallbladder fossa. Status post cholecystectomy. No intrahepatic or extrahepatic ductal dilatation. Common duct measures 5 mm (series 6/image 24). No choledocholithiasis is seen. 1.6 x 2.6 cm complex fluid collection with layering fluid-fluid level in the gallbladder fossa (series 8/image 21), likely reflecting a biloma. This collection is adjacent to susceptibility artifact from a surgical clips (series 8/image 20), likely corresponding to the known bile leak at the cystic duct remnant/stump. Direct drainage of the collection to the skin surface via a catheter tract along the right anterior abdomen and exiting the right lateral abdominal wall (series 8/images 24, 27, and 35). Pancreas: Within normal limits. Spleen: Within normal limits. Adrenals/Urinary Tract: Adrenal glands are within normal limits. Kidneys within normal limits.  No hydronephrosis. Stomach/Bowel: Stomach is within normal limits. Visualized bowel is unremarkable. Vascular/Lymphatic: No evidence of abdominal aortic aneurysm. No suspicious abdominal lymphadenopathy. Other: No abdominal ascites. Musculoskeletal: No focal osseous lesions. IMPRESSION: Status post cholecystectomy. No intrahepatic or extrahepatic ductal dilatation. Common duct measures 5 mm. No choledocholithiasis is seen. 1.6 x 2.6 cm complex fluid collection/biloma in the gallbladder fossa, likely corresponding to the known bile leak at the cystic duct remnant/stump. Drug drainage of the collection to the skin surface deviated a catheter tract along the right anterior abdomen and exiting the right lateral abdominal wall. Additional ancillary findings as above. Electronically Signed   By: Charline Bills M.D.   On: 02/18/2015 19:43     Mr Abd W/wo Cm/mrcp  02/18/2015  CLINICAL DATA:  Status post laparoscopic cholecystectomy on 01/27/2015, bile leak via surgical wound, pain/tenderness EXAM: MRI ABDOMEN WITHOUT AND WITH CONTRAST (INCLUDING MRCP) TECHNIQUE: Multiplanar multisequence MR imaging of the abdomen was performed both before and after the administration of intravenous contrast. Heavily T2-weighted images of the biliary and pancreatic ducts were obtained, and three-dimensional MRCP images were  rendered by post processing. CONTRAST:  12mL MULTIHANCE GADOBENATE DIMEGLUMINE 529 MG/ML IV SOLN COMPARISON:  Nuclear medicine hepatobiliary scan dated 02/18/2015. CT abdomen pelvis dated 02/09/2015. ERCP dated 02/08/2015. FINDINGS: Lower chest: Mild patchy opacity at the left lung base (series 7/image 6). Cardiomegaly. Hepatobiliary: Liver is notable for mild hepatic steatosis. No suspicious/enhancing hepatic lesions. Mild increased perfusion along the gallbladder fossa. Status post cholecystectomy. No intrahepatic or extrahepatic ductal dilatation. Common duct measures 5 mm (series 6/image 24). No choledocholithiasis is seen. 1.6 x 2.6 cm complex fluid collection with layering fluid-fluid level in the gallbladder fossa (series 8/image 21), likely reflecting a biloma. This collection is adjacent to susceptibility artifact from a surgical clips (series 8/image 20), likely corresponding to the known bile leak at the cystic duct remnant/stump. Direct drainage of the collection to the skin surface via a catheter tract along the right anterior abdomen and exiting the right lateral abdominal wall (series 8/images 24, 27, and 35). Pancreas: Within normal limits. Spleen: Within normal limits. Adrenals/Urinary Tract: Adrenal glands are within normal limits. Kidneys within normal limits.  No hydronephrosis. Stomach/Bowel: Stomach is within normal limits. Visualized bowel is unremarkable. Vascular/Lymphatic: No evidence of abdominal aortic aneurysm. No  suspicious abdominal lymphadenopathy. Other: No abdominal ascites. Musculoskeletal: No focal osseous lesions. IMPRESSION: Status post cholecystectomy. No intrahepatic or extrahepatic ductal dilatation. Common duct measures 5 mm. No choledocholithiasis is seen. 1.6 x 2.6 cm complex fluid collection/biloma in the gallbladder fossa, likely corresponding to the known bile leak at the cystic duct remnant/stump. Drug drainage of the collection to the skin surface deviated a catheter tract along the right anterior abdomen and exiting the right lateral abdominal wall. Additional ancillary findings as above. Electronically Signed   By: Charline Bills M.D.   On: 02/18/2015 19:43    EKG:  NSR with no ST changes.  Admit EKG with NSR and LVH with repolarization changes.  She had a similar EKG noted back in 02/2014  ASSESSMENT/PLAN: 1.  Abnormal EKG - EKGs reviewed as far back as 02/2014 which show a similar appearance to EKG done on admission.  These show NSR with LVH and repolarization abnormality.   2.  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  This could be due to underlying gastrointestinal issues but also could represent underlying coronary ischemia in the setting of acute stress from underlying illness.  Troponin is negative.  She did well without any cardiac complications during recent cholecystectomy 01/27/2015.  Nuc med stress test in setting of similar appearing EKG 02/2014 showed no ischemia and normal LVF.  I will make her NPO after MN for lexiscan myoview in am since ERCP will be done under general anesthesia.   3.  Gangrenous GB s/p cholecystectomy now with biliary leak.   4.  Abdominal pain, nausea and vomiting secondary to #3 5.  Acute renal failure secondary to  dehydration and volume depletion.  6.  HTN - poorly controlled.  Needs aggressive treatment of HTN.  BB has been increased.  Increase clonidine as needed. 7.  Hypokalemia - replete per hospitalist   She has a history of abnormal EKG dating back at least to admission 02/2014 at which time she had a similar appearing EKG and underwent nuclear stress test  with no ischemia and normal LVF.   Her biggest risk is from poorly controlled HTN.  Needs aggressive BP treatment.  It is difficult to assess whether her current episodeic chest discomfort is from her underlying GI issues or coronary ischemia in setting of stress from recent illness.  Would recommend Lexiscan myoview prior to undergoing ERCP since she is going to have general anesthesia.    Quintella ReichertURNER,Sheila R, MD  02/19/2015  2:12 PM

## 2015-02-19 NOTE — Progress Notes (Addendum)
[facility]                                                                              Patient Demographics  Sheila [name], is a 59 y.o. female, DOB - 05/22/1955, ZOX:[id number]  Admit date - 02/17/2015   Admitting Physician Starleen Arms, MD  Outpatient Primary MD for the patient is [name], MD  LOS - 2   Chief Complaint  Patient presents with  . Abdominal Pain       Brief HPI  Per Dr.Elgergawy on 02/17/15 Sheila [name] is a 59 y.o. female, past medical history of coronary artery disease, hypertension, lupus and hyperlipidemia, patient with recent hospitalization at [facility], underwent open cholecystectomy on 01/27/2015, she was found to have gangrenous gallbladder and empyema, had JP drain placed in subhepatic space is due to the necrotic nature of the cystic duct, patient continues to have significant output, underwent ERCP on 02/07/2015 with biliary stent placement, patient was seen yesterday by surgery, had JP drain discontinued, patient complains of abdominal pain, nausea vomiting, report this has been constant during the entire 3 weeks, has worsened over the last 2 weeks, as was sent to ED by her PCP today, workup in ED was significant for worsening LFTs, and acute renal failure with creatinine of 2.2, mild leukocytosis of 11.7, had CT abdomen pelvis done which did show fluid collection in gallbladder fossa tracking anteriorly. Hospitalist service requested for admission   Assessment & Plan    Principal problem Abdominal pain, nausea and vomiting secondary to continuous biliary leak.: LFTs are trending down, lipase is trending down  -likely due to continuous biliary leak, patient status post biliary stent placement, her JP drain was discontinued on 10/27  - Imagings noted by interventional radiology, noted did not large enough fluid collection to drain.  - HIDA scan + for biliary leak and MRCP with 1.6 x 2.6 cm complex fluid  collection/bilioma in the gallbladder fossa  - Continue IV Cipro and Flagyl, fluids, still NPO   - ERCP today , highly appreciate GI and surgery assistance.  Active problems  Acute renal failure likely related to dehydration and volume depletion from her nausea and vomiting. -Resolved with IV fluids, avoid nephrotoxic medications.  Elevated LFTs - Most likely related to her biliary leak, improving  Elevated lipase - Mildly elevated, no evidence of pancreatitis on CT abdomen and pelvis  Hypertension BP somewhat uncontrolled, increased metoprolol, continue clonidine, hydralazine IV as needed  Abnormal EKG on admission - EKG on admission had shown ST depression in inferior leads. No chest pain or SOB. Troponin 0, repeat ekg today with no ischemic changes. Prior hx of CAD, hx MI 2012, mid RCA stent, (Dr Diona Browner in [place]), nuc med stress test in 02/2014 had shown no reversible ischemia or infarction, normal EF 61%   - ERCP cancelled today until cardiology clearance requested by GI. Called Cardiology consult.   Code Status: Full code   Family Communication: Discussed in detail with the patient, all imaging results, lab results explained to the patient  and husband   Disposition Plan: Not medically ready   Time Spent in minutes  25 mins   Procedures  CT abdomen   Consults   Interventional radiology   GI Surgery  DVT Prophylaxis heparin subcutaneous   Medications  Scheduled Meds: . ciprofloxacin  200 mg Intravenous Q12H  . cloNIDine  0.1 mg Oral BID  . docusate sodium  100 mg Oral BID  . escitalopram  10 mg Oral Daily  . heparin  5,000 Units Subcutaneous 3 times per day  . metoprolol  12.5 mg Oral BID  . metronidazole  500 mg Intravenous Q8H  . pantoprazole  40 mg Oral Daily  . sodium chloride  3 mL Intravenous Q12H   Continuous Infusions: . sodium chloride 100 mL/hr at 02/19/15 0839   PRN Meds:.hydrALAZINE, morphine injection, ondansetron (ZOFRAN) IV,  promethazine, technetium TC 52M mebrofenin   Antibiotics   Anti-infectives    Start     Dose/Rate Route Frequency Ordered Stop   02/17/15 2000  ciprofloxacin (CIPRO) IVPB 200 mg     200 mg 100 mL/hr over 60 Minutes Intravenous Every 12 hours 02/17/15 1911     02/17/15 1915  metroNIDAZOLE (FLAGYL) IVPB 500 mg     500 mg 100 mL/hr over 60 Minutes Intravenous Every 8 hours 02/17/15 1911          Subjective:   Sheila [name] was seen and examined today. Continues to have abdominal pain 6/10 in the right abdominal area, no nausea or vomiting. Afebrile. Still biliary leak.  Denies any dizziness, chest pain, shortness of breath, N/V/D/C, new weakness, numbess, tingling. No acute events overnight.    Objective:   Blood pressure 194/75, pulse 82, temperature 98.2 F (36.8 C), temperature source Oral, resp. rate 18, height  (1.549 m), weight 59.739 kg (131 lb 11.2 oz), SpO2 98 %.  Wt Readings from Last 3 Encounters:  02/18/15 59.739 kg (131 lb 11.2 oz)  02/08/15 63.957 kg (141 lb)  02/07/15 67.132 kg (148 lb)     Intake/Output Summary (Last 24 hours) at 02/19/15 1100 Last data filed at 02/19/15 0944  Gross per 24 hour  Intake   1560 ml  Output   1700 ml  Net   -140 ml    Exam  General: Alert and oriented x 3, NAD  HEENT:  PERRLA, EOMI   Neck: Supple, no JVD, no masses  CVS: S1 S2 clear, RR  Respiratory: CTAB  Abdomen: Soft, tenderness in the right abdominal area, biliary drainage, dressings/towels soaked   Ext: no cyanosis clubbing or edema  Neuro: no new deficits  Skin: No rashes  Psych: Normal affect and demeanor, alert and oriented x3    Data Review   Micro Results No results found for this or any previous visit (from the past 240 hour(s)).  Radiology Reports Ct Abdomen Pelvis Wo Contrast  02/17/2015  CLINICAL DATA:  Recent cholecystectomy for gangrenous cholecystitis. Right upper quadrant abdominal pain. EXAM: CT ABDOMEN AND PELVIS WITHOUT  CONTRAST TECHNIQUE: Multidetector CT imaging of the abdomen and pelvis was performed following the standard protocol without IV contrast. COMPARISON:  01/25/2015 FINDINGS: Lower chest: Patchy parenchymal densities in the left lower lobe are concerning for a small focus of aspiration or pneumonia. Evidence for coronary artery calcifications. Hepatobiliary: There is a nonmetallic biliary stent with the distal aspect in the duodenum. Cholecystectomy clips are noted. Vague low-density in the gallbladder fossa is suggestive for fluid. This fluid tracks anterior towards the abdominal wall. Tiny focus of gas near the gallbladder fossa. Three small calcific foci between the liver and right kidney upper  pole are concerning for retained gallstones. These small calcifications are grouped together and roughly measure up to 1 cm in size. Inflammatory changes along the right upper anterior abdominal wall related to recent surgery. Limited evaluation of the liver and gallbladder fossa without intravenous contrast. Pancreas: Normal appearance of the pancreas without duct dilatation or inflammation. Spleen: No acute abnormality to the spleen. Adrenals/Urinary Tract: Normal appearance of the adrenal glands. Normal appearance of both kidneys without stones or hydronephrosis. Normal appearance of the urinary bladder. Stomach/Bowel: Moderate stool burden throughout the colon without acute colonic inflammation. Normal appearance of small bowel without obstruction. There may be mild inflammation of the proximal duodenum adjacent to the liver. Vascular/Lymphatic: Atherosclerotic calcifications in the aorta and iliac arteries without aneurysm. No significant abdominal or pelvic lymphadenopathy. Reproductive: Uterus is surgically absent. Other: No significant free fluid. Musculoskeletal: No suspicious bone lesions. IMPRESSION: Post cholecystectomy. There is a small amount of fluid and gas in the gallbladder fossa with expected inflammatory  changes from recent surgery. Nonmetallic biliary stent. Small calcifications between the liver and right kidney are suggestive for retained gallstones. Patchy densities in the left lower lobe are concerning for a small focus of pneumonia or aspiration pneumonitis. Electronically Signed   By: Adam  Henn M.D.   On: 02/17/2015 17:26   Dg Chest 2 View  02/17/2015  CLINICAL DATA:  Fever, chest pain, shortness of breath. EXAM: CHEST  2 VIEW COMPARISON:  January 25, 2015. FINDINGS: The heart size and mediastinal contours are within normal limits. Both lungs are clear. No pneumothorax or pleural effusion is noted. The visualized skeletal structures are unremarkable. IMPRESSION: No active cardiopulmonary disease. Electronically Signed   By: James  Green Jr, M.D.   On: 02/17/2015 15:50   X-ray Chest Pa And Lateral  01/25/2015  CLINICAL DATA:  Pt states she has been having stomach pain onset for 4 days. Pt states she has been vomiting and feeling nauseous. EXAM: CHEST  2 VIEW COMPARISON:  03/20/2014 FINDINGS: Cardiac silhouette is borderline enlarged. No mediastinal or hilar masses or pathologically enlarged lymph nodes. There is a right coronary artery stent. Clear lungs.  No pleural effusion or pneumothorax. Bony thorax is intact. IMPRESSION: No acute cardiopulmonary disease. Electronically Signed   By: David  Ormond M.D.   On: 01/25/2015 12:40   Nm Hepatobiliary Including Gb  02/18/2015  CLINICAL DATA:  Status post cholecystectomy, evaluate for bile leak EXAM: NUCLEAR MEDICINE HEPATOBILIARY IMAGING TECHNIQUE: Sequential images of the abdomen were obtained out to 60 minutes following intravenous administration of radiopharmaceutical. RADIOPHARMACEUTICALS:  5.3 mCi Tc-29m  Choletec IV COMPARISON:  CT abdomen pelvis dated 02/17/2015 FINDINGS: Normal hepatic excretion. Within 15 minutes, radiotracer begins to pool along the inferior liver margin. This is clearly extraluminal and reflects a bile leak. Study was aborted  after 45 minutes. IMPRESSION: Study is positive for bile leak. These results will be called to the ordering clinician or representative by the Radiologist Assistant, and communication documented in the PACS or zVision Dashboard. Electronically Signed   By: Sriyesh  Krishnan M.D.   On: 02/18/2015 14:06   Ct Abdomen Pelvis W Contrast  01/25/2015  CLINICAL DATA:  Diffuse abdominal pain.  Fever and diarrhea EXAM: CT ABDOMEN AND PELVIS WITH CONTRAST TECHNIQUE: Multidetector CT imaging of the abdomen and pelvis was performed using the standard protocol following bolus administration of intravenous contrast. CONTRAST:  50mL OMNIPAQUE 4mOHEXOL 300 MG/ML SOLN, 1006 6 528413IPAQUE IOHEXOL 300 MG/ML SOLN COMPARISON:  None. FINDINGS: Lower chest: Minimal bibasilar atelectasis. No infiltrate or  effusion in the lung bases. Heart size upper normal. Hepatobiliary: Gallbladder is distended with gallbladder wall thickening. Mild stranding in the pericholecystic fat. There is a small gas bubble in the fundus of the gallbladder. Multiple calcified gallstones are present. There is gas in the cystic duct and common bile duct. The common bile duct is nondilated. Anterior to the cystic duct in the porta hepatis there is a multilocular cyst measuring 33 mm in diameter. No additional liver lesions identified. No free air no free fluid. Pancreas: Normal pancreas. Negative for pancreatitis or pancreatic mass. Spleen: Negative Adrenals/Urinary Tract: Negative for renal obstruction. No renal mass or renal calculi. Urinary bladder normal. Stomach/Bowel: Negative for bowel obstruction. Small hiatal hernia. Negative for bowel edema. Appendix not visualized. Vascular/Lymphatic: Atherosclerotic calcification in the aorta and iliac arteries. Negative for aortic aneurysm. Reproductive: Hysterectomy.  Negative for pelvic mass. Other: No free fluid.  No free air.  Negative for adenopathy. Musculoskeletal: Minimal lumbar degenerative change. No acute  skeletal abnormality. IMPRESSION: Cholelithiasis. Distended gallbladder with gallbladder wall thickening and mild pericholecystic edema. Findings suggest acute cholecystitis. In addition, there is gas in the gallbladder lumen as well as in the cystic duct and common bile duct compatible with cholecystitis. 33 mm multilocular cyst in the porta hepatis. This may represent a biliary cystadenoma or septated hepatic cyst. Appendix not visualized. Electronically Signed   By: Marlan Palau M.D.   On: 01/25/2015 07:17   Mr 3d Recon At Scanner  02/18/2015  CLINICAL DATA:  Status post laparoscopic cholecystectomy on 01/27/2015, bile leak via surgical wound, pain/tenderness EXAM: MRI ABDOMEN WITHOUT AND WITH CONTRAST (INCLUDING MRCP) TECHNIQUE: Multiplanar multisequence MR imaging of the abdomen was performed both before and after the administration of intravenous contrast. Heavily T2-weighted images of the biliary and pancreatic ducts were obtained, and three-dimensional MRCP images were rendered by post processing. CONTRAST:  12mL MULTIHANCE GADOBENATE DIMEGLUMINE 529 MG/ML IV SOLN COMPARISON:  Nuclear medicine hepatobiliary scan dated 02/18/2015. CT abdomen pelvis dated 02/09/2015. ERCP dated 02/08/2015. FINDINGS: Lower chest: Mild patchy opacity at the left lung base (series 7/image 6). Cardiomegaly. Hepatobiliary: Liver is notable for mild hepatic steatosis. No suspicious/enhancing hepatic lesions. Mild increased perfusion along the gallbladder fossa. Status post cholecystectomy. No intrahepatic or extrahepatic ductal dilatation. Common duct measures 5 mm (series 6/image 24). No choledocholithiasis is seen. 1.6 x 2.6 cm complex fluid collection with layering fluid-fluid level in the gallbladder fossa (series 8/image 21), likely reflecting a biloma. This collection is adjacent to susceptibility artifact from a surgical clips (series 8/image 20), likely corresponding to the known bile leak at the cystic duct  remnant/stump. Direct drainage of the collection to the skin surface via a catheter tract along the right anterior abdomen and exiting the right lateral abdominal wall (series 8/images 24, 27, and 35). Pancreas: Within normal limits. Spleen: Within normal limits. Adrenals/Urinary Tract: Adrenal glands are within normal limits. Kidneys within normal limits.  No hydronephrosis. Stomach/Bowel: Stomach is within normal limits. Visualized bowel is unremarkable. Vascular/Lymphatic: No evidence of abdominal aortic aneurysm. No suspicious abdominal lymphadenopathy. Other: No abdominal ascites. Musculoskeletal: No focal osseous lesions. IMPRESSION: Status post cholecystectomy. No intrahepatic or extrahepatic ductal dilatation. Common duct measures 5 mm. No choledocholithiasis is seen. 1.6 x 2.6 cm complex fluid collection/biloma in the gallbladder fossa, likely corresponding to the known bile leak at the cystic duct remnant/stump. Drug drainage of the collection to the skin surface deviated a catheter tract along the right anterior abdomen and exiting the right lateral abdominal wall. Additional ancillary  findings as above. Electronically Signed   By: Charline Bills M.D.   On: 02/18/2015 19:43   Dg Ercp Biliary & Pancreatic Ducts  02/08/2015  CLINICAL DATA:  Sphincterotomy.  Biliary stent placement. EXAM: ERCP TECHNIQUE: Multiple spot images obtained with the fluoroscopic device and submitted for interpretation post-procedure. COMPARISON:  CT abdomen and pelvis- 01/25/2015; abdominal ultrasound - 01/25/2015 FINDINGS: Three spot intraoperative images during ERCP are provided for review. Initial image demonstrates an ERCP probe overlying the right upper abdominal quadrant. Surgical clips overlie the expected location of the gallbladder fossa. A surgical drain also overlies the expected location of the gallbladder fossa. Subsequent images demonstrate selective cannulation opacification of the CBD. There is apparent  extravasation of injected contrast adjacent to the cholecystectomy clips as well as the cranial end of the surgically placed drain. There is no definitive opacification of the cystic duct though evaluation is degraded secondary to patient respiratory artifact. Completion image demonstrates placement of a internal plastic biliary stent overlying expected location of the CBD. IMPRESSION: 1. ERCP with biliary stent placement as above. 2. Contrast injection demonstrates extravasation of contrast about the cholecystectomy clips as well as the cranial aspect of the surgically placed drain indicative of a biliary leak. The cystic duct was not definitely identified though evaluation is degraded secondary to patient respiration. Correlation with the operative report is recommended. These images were submitted for radiologic interpretation only. Please see the procedural report for the amount of contrast and the fluoroscopy time utilized. Electronically Signed   By: Simonne Come M.D.   On: 02/08/2015 14:56   Mr Abd W/wo Cm/mrcp  02/18/2015  CLINICAL DATA:  Status post laparoscopic cholecystectomy on 01/27/2015, bile leak via surgical wound, pain/tenderness EXAM: MRI ABDOMEN WITHOUT AND WITH CONTRAST (INCLUDING MRCP) TECHNIQUE: Multiplanar multisequence MR imaging of the abdomen was performed both before and after the administration of intravenous contrast. Heavily T2-weighted images of the biliary and pancreatic ducts were obtained, and three-dimensional MRCP images were rendered by post processing. CONTRAST:  12mL MULTIHANCE GADOBENATE DIMEGLUMINE 529 MG/ML IV SOLN COMPARISON:  Nuclear medicine hepatobiliary scan dated 02/18/2015. CT abdomen pelvis dated 02/09/2015. ERCP dated 02/08/2015. FINDINGS: Lower chest: Mild patchy opacity at the left lung base (series 7/image 6). Cardiomegaly. Hepatobiliary: Liver is notable for mild hepatic steatosis. No suspicious/enhancing hepatic lesions. Mild increased perfusion along the  gallbladder fossa. Status post cholecystectomy. No intrahepatic or extrahepatic ductal dilatation. Common duct measures 5 mm (series 6/image 24). No choledocholithiasis is seen. 1.6 x 2.6 cm complex fluid collection with layering fluid-fluid level in the gallbladder fossa (series 8/image 21), likely reflecting a biloma. This collection is adjacent to susceptibility artifact from a surgical clips (series 8/image 20), likely corresponding to the known bile leak at the cystic duct remnant/stump. Direct drainage of the collection to the skin surface via a catheter tract along the right anterior abdomen and exiting the right lateral abdominal wall (series 8/images 24, 27, and 35). Pancreas: Within normal limits. Spleen: Within normal limits. Adrenals/Urinary Tract: Adrenal glands are within normal limits. Kidneys within normal limits.  No hydronephrosis. Stomach/Bowel: Stomach is within normal limits. Visualized bowel is unremarkable. Vascular/Lymphatic: No evidence of abdominal aortic aneurysm. No suspicious abdominal lymphadenopathy. Other: No abdominal ascites. Musculoskeletal: No focal osseous lesions. IMPRESSION: Status post cholecystectomy. No intrahepatic or extrahepatic ductal dilatation. Common duct measures 5 mm. No choledocholithiasis is seen. 1.6 x 2.6 cm complex fluid collection/biloma in the gallbladder fossa, likely corresponding to the known bile leak at the cystic duct remnant/stump. Drug drainage  of the collection to the skin surface deviated a catheter tract along the right anterior abdomen and exiting the right lateral abdominal wall. Additional ancillary findings as above. Electronically Signed   By: Charline Bills M.D.   On: 02/18/2015 19:43   US Abdomen Limited Ruq  01/25/2015  CLINICAL DATA:  59 year old with acute onset of upper abdominal pain associated with nausea, vomiting and diarrhea which began 3 days ago. EXAM: US ABDOMEN LIMITED - RIGHT UPPER QUADRANT COMPARISON:  CT abdomen and  pelvis performed earlier same date. FINDINGS: Gallbladder: Numerous shadowing gallstones and echogenic sludge. Gallbladder wall thickening, likely overestimated on ultrasound due to the gas within the gallbladder lumen as noted on the earlier CT. Positive sonographic Murphy sign according to the ultrasound technologist. Common bile duct: Diameter: Approximately 5 mm diameter. Gas within the common bile duct as noted on the CT. Liver: Multilocular fluid collection centrally in the liver as noted on CT measuring approximately 3.3 x 2.1 cm. No other focal hepatic parenchymal abnormality. Patent portal vein with antegrade flow. IMPRESSION: 1. Cholelithiasis and acute cholecystitis. Gas within the gallbladder fundus and the nondistended common bile duct as noted on earlier CT, likely due to a gas producing organism. Less likely, the gas could arise due to a fistula with adjacent bowel. 2. Multilocular fluid collection centrally in the liver as noted on earlier CT, likely a liver abscess, given its proximity to the infected gallbladder. Electronically Signed   By: Hulan Saas M.D.   On: 01/25/2015 07:59    CBC  Recent Labs Lab 02/17/15 1459 02/18/15 0825 02/19/15 0832  WBC 11.7* 9.3 10.1  HGB 12.9 9.6* 10.2*  HCT 38.7 29.6* 32.1*  PLT 674* 482* 433*  MCV 87.8 88.9 90.2  MCH 29.3 28.8 28.7  MCHC 33.3 32.4 31.8  RDW 13.6 13.8 13.9    Chemistries   Recent Labs Lab 02/17/15 1459 02/18/15 0825 02/19/15 0832  NA 136 143 144  K 3.2* 3.7 3.3*  CL 101 114* 110  CO2 20* 17* 22  GLUCOSE 127* 104* 107*  BUN 45* 24* 6  CREATININE 2.22* 0.95 0.71  CALCIUM 10.3 9.1 9.3  AST 81* 45* 29  ALT 80* 54 42  ALKPHOS 151* 106 107  BILITOT 0.8 0.8 0.7   ------------------------------------------------------------------------------------------------------------------ estimated creatinine clearance is 61.3 mL/min (by C-G formula based on Cr of  0.71). ------------------------------------------------------------------------------------------------------------------ No results for input(s): HGBA1C in the last 72 hours. ------------------------------------------------------------------------------------------------------------------ No results for input(s): CHOL, HDL, LDLCALC, TRIG, CHOLHDL, LDLDIRECT in the last 72 hours. ------------------------------------------------------------------------------------------------------------------ No results for input(s): TSH, T4TOTAL, T3FREE, THYROIDAB in the last 72 hours.  Invalid input(s): FREET3 ------------------------------------------------------------------------------------------------------------------ No results for input(s): VITAMINB12, FOLATE, FERRITIN, TIBC, IRON, RETICCTPCT in the last 72 hours.  Coagulation profile No results for input(s): INR, PROTIME in the last 168 hours.  No results for input(s): DDIMER in the last 72 hours.  Cardiac Enzymes No results for input(s): CKMB, TROPONINI, MYOGLOBIN in the last 168 hours.  Invalid input(s): CK ------------------------------------------------------------------------------------------------------------------ Invalid input(s): POCBNP  No results for input(s): GLUCAP in the last 72 hours.   Isabeau Mccalla M.D. [facility] 02/19/2015, 11:00 AM  Pager: [phone] Between 7am to 7pm - call Pager - [phone]  After 7pm go to [email or web address] - password [email or web address]  Call night coverage person covering after 7pm

## 2015-02-20 ENCOUNTER — Inpatient Hospital Stay (HOSPITAL_COMMUNITY): Payer: Medicare Other

## 2015-02-20 DIAGNOSIS — R9431 Abnormal electrocardiogram [ECG] [EKG]: Secondary | ICD-10-CM

## 2015-02-20 DIAGNOSIS — R079 Chest pain, unspecified: Secondary | ICD-10-CM

## 2015-02-20 LAB — COMPREHENSIVE METABOLIC PANEL
ALK PHOS: 103 U/L (ref 38–126)
ALT: 36 U/L (ref 14–54)
AST: 30 U/L (ref 15–41)
Albumin: 2.8 g/dL — ABNORMAL LOW (ref 3.5–5.0)
Anion gap: 11 (ref 5–15)
BUN: <5 mg/dL — ABNORMAL LOW (ref 6–20)
CALCIUM: 9.1 mg/dL (ref 8.9–10.3)
CO2: 24 mmol/L (ref 22–32)
CREATININE: 0.59 mg/dL (ref 0.44–1.00)
Chloride: 106 mmol/L (ref 101–111)
Glucose, Bld: 117 mg/dL — ABNORMAL HIGH (ref 65–99)
Potassium: 2.9 mmol/L — ABNORMAL LOW (ref 3.5–5.1)
Sodium: 141 mmol/L (ref 135–145)
TOTAL PROTEIN: 6.3 g/dL — AB (ref 6.5–8.1)
Total Bilirubin: 0.6 mg/dL (ref 0.3–1.2)

## 2015-02-20 LAB — NM MYOCAR MULTI W/SPECT W/WALL MOTION / EF
CSEPPHR: 139 {beats}/min
MPHR: 87 {beats}/min
Percent HR: 159 %
Rest HR: 87 {beats}/min

## 2015-02-20 LAB — CBC
HCT: 29.1 % — ABNORMAL LOW (ref 36.0–46.0)
Hemoglobin: 9.5 g/dL — ABNORMAL LOW (ref 12.0–15.0)
MCH: 29.3 pg (ref 26.0–34.0)
MCHC: 32.6 g/dL (ref 30.0–36.0)
MCV: 89.8 fL (ref 78.0–100.0)
PLATELETS: 376 10*3/uL (ref 150–400)
RBC: 3.24 MIL/uL — AB (ref 3.87–5.11)
RDW: 14 % (ref 11.5–15.5)
WBC: 9.1 10*3/uL (ref 4.0–10.5)

## 2015-02-20 LAB — POTASSIUM: POTASSIUM: 3.4 mmol/L — AB (ref 3.5–5.1)

## 2015-02-20 LAB — MAGNESIUM: Magnesium: 1.6 mg/dL — ABNORMAL LOW (ref 1.7–2.4)

## 2015-02-20 MED ORDER — REGADENOSON 0.4 MG/5ML IV SOLN
0.4000 mg | Freq: Once | INTRAVENOUS | Status: AC
  Administered 2015-02-20: 0.4 mg via INTRAVENOUS
  Filled 2015-02-20: qty 5

## 2015-02-20 MED ORDER — TECHNETIUM TC 99M SESTAMIBI GENERIC - CARDIOLITE
10.0000 | Freq: Once | INTRAVENOUS | Status: AC | PRN
  Administered 2015-02-20: 10 via INTRAVENOUS

## 2015-02-20 MED ORDER — POTASSIUM CHLORIDE 10 MEQ/100ML IV SOLN
10.0000 meq | INTRAVENOUS | Status: AC
  Administered 2015-02-20 (×2): 10 meq via INTRAVENOUS
  Filled 2015-02-20 (×3): qty 100

## 2015-02-20 MED ORDER — REGADENOSON 0.4 MG/5ML IV SOLN
INTRAVENOUS | Status: AC
  Administered 2015-02-20: 0.4 mg via INTRAVENOUS
  Filled 2015-02-20: qty 5

## 2015-02-20 MED ORDER — TECHNETIUM TC 99M SESTAMIBI GENERIC - CARDIOLITE
30.0000 | Freq: Once | INTRAVENOUS | Status: AC | PRN
  Administered 2015-02-20: 30 via INTRAVENOUS

## 2015-02-20 MED ORDER — POTASSIUM CHLORIDE CRYS ER 20 MEQ PO TBCR
40.0000 meq | EXTENDED_RELEASE_TABLET | Freq: Once | ORAL | Status: AC
  Administered 2015-02-20: 40 meq via ORAL
  Filled 2015-02-20: qty 2

## 2015-02-20 MED ORDER — MAGNESIUM SULFATE 50 % IJ SOLN
3.0000 g | Freq: Once | INTRAVENOUS | Status: AC
  Administered 2015-02-20: 3 g via INTRAVENOUS
  Filled 2015-02-20: qty 6

## 2015-02-20 MED ORDER — CIPROFLOXACIN IN D5W 400 MG/200ML IV SOLN
400.0000 mg | Freq: Two times a day (BID) | INTRAVENOUS | Status: DC
  Administered 2015-02-20 – 2015-02-23 (×7): 400 mg via INTRAVENOUS
  Filled 2015-02-20 (×6): qty 200

## 2015-02-20 MED ORDER — METOPROLOL TARTRATE 50 MG PO TABS
50.0000 mg | ORAL_TABLET | Freq: Two times a day (BID) | ORAL | Status: DC
  Administered 2015-02-20 – 2015-02-23 (×6): 50 mg via ORAL
  Filled 2015-02-20: qty 2
  Filled 2015-02-20 (×5): qty 1

## 2015-02-20 MED ORDER — POTASSIUM CHLORIDE CRYS ER 20 MEQ PO TBCR
40.0000 meq | EXTENDED_RELEASE_TABLET | ORAL | Status: AC
  Administered 2015-02-20 (×2): 40 meq via ORAL
  Filled 2015-02-20 (×2): qty 2

## 2015-02-20 NOTE — Care Management Important Message (Signed)
Important Message  Patient Details  Name: Sheila [name] MRN: [id number] Date of Birth: 03-02-1956   Medicare Important Message Given:  Yes-second notification given    [name], [name] 02/20/2015, 5:45 PM

## 2015-02-20 NOTE — Clinical Documentation Improvement (Signed)
Internal Medicine  Please clarify nutritional status in progress notes and discharge summary.  Severe protein calorie malnutrition  Other condition  Unable to clinically determine  Document any associated diagnoses/conditions   Supporting Information:    10/29 Initial Nutrition Assessment  DOCUMENTATION CODES: Severe malnutrition in context of acute illness/injury  NUTRITION DIAGNOSIS: Inadequate oral intake related to poor appetite, acute illness as evidenced by meal completion < 50%.  Her weight has severely decreased (11%) this month due to her inadequate oral intake .   NFPE completed. Mild orbital, temporal and upper arm depletions.  Height 5\' 1"     Weight 129 lbs     BMI 24.39  INTERVENTION: Follow up for diet advancement and add oral nutrition supplements   Please exercise your independent, professional judgment when responding. A specific answer is not anticipated or expected.   Thank You, Harless Littenebora T Classie Weng [employer] [facility] [place] [phone]

## 2015-02-20 NOTE — Progress Notes (Signed)
  Echocardiogram 2D Echocardiogram has been performed.  Sheila [name], Sheila [name] 02/20/2015, 3:56 PM

## 2015-02-20 NOTE — Progress Notes (Addendum)
    Pre op nuclear stress test returned high risk with two reversible defects involving the lateral wall consistent with areas of ischemia and lateral wall motion abnormality. LVEF 70%. Will defer next step to Dr. Mayford Knifeurner tomorrow AM. Would keep NPO after midnight.    Cline CrockKathryn Nykolas Bacallao PA-C  [facility]

## 2015-02-20 NOTE — Progress Notes (Addendum)
SUBJECTIVE:  No complaints  OBJECTIVE:   Vitals:   Filed Vitals:   02/19/15 1600 02/19/15 1640 02/19/15 2040 02/20/15 0356  BP: 200/88 168/68 140/76 155/63  Pulse: 94 113 112 82  Temp:   99 F (37.2 C) 98.1 F (36.7 C)  TempSrc:   Oral Oral  Resp:   18 20  Height:      Weight:   132 lb 7.9 oz (60.1 kg)   SpO2:   97% 96%   I&O's:   Intake/Output Summary (Last 24 hours) at 02/20/15 7829 Last data filed at 02/20/15 0554  Gross per 24 hour  Intake 2690.01 ml  Output   1800 ml  Net 890.01 ml   TELEMETRY: Reviewed telemetry pt in NSR:     PHYSICAL EXAM General: Well developed, well nourished, in no acute distress Head: Eyes PERRLA, No xanthomas.   Normal cephalic and atramatic  Lungs:   Clear bilaterally to auscultation and percussion. Heart:   HRRR S1 S2 Pulses are 2+ & equal. Abdomen: Bowel sounds are positive, abdomen soft and non-tender without masses  Extremities:   No clubbing, cyanosis or edema.  DP +1 Neuro: Alert and oriented X 3. Psych:  Good affect, responds appropriately   LABS: Basic Metabolic Panel:  Recent Labs  56/21/30 0832 02/20/15 0505  NA 144 141  K 3.3* 2.9*  CL 110 106  CO2 22 24  GLUCOSE 107* 117*  BUN 6 <5*  CREATININE 0.71 0.59  CALCIUM 9.3 9.1   Liver Function Tests:  Recent Labs  02/19/15 0832 02/20/15 0505  AST 29 30  ALT 42 36  ALKPHOS 107 103  BILITOT 0.7 0.6  PROT 7.0 6.3*  ALBUMIN 2.9* 2.8*    Recent Labs  02/18/15 0825 02/19/15 0832  LIPASE 69* 72*   CBC:  Recent Labs  02/19/15 0832 02/20/15 0505  WBC 10.1 9.1  HGB 10.2* 9.5*  HCT 32.1* 29.1*  MCV 90.2 89.8  PLT 433* 376   Cardiac Enzymes: No results for input(s): CKTOTAL, CKMB, CKMBINDEX, TROPONINI in the last 72 hours. BNP: Invalid input(s): POCBNP D-Dimer: No results for input(s): DDIMER in the last 72 hours. Hemoglobin A1C: No results for input(s): HGBA1C in the last 72 hours. Fasting Lipid Panel: No results for input(s): CHOL, HDL,  LDLCALC, TRIG, CHOLHDL, LDLDIRECT in the last 72 hours. Thyroid Function Tests: No results for input(s): TSH, T4TOTAL, T3FREE, THYROIDAB in the last 72 hours.  Invalid input(s): FREET3 Anemia Panel: No results for input(s): VITAMINB12, FOLATE, FERRITIN, TIBC, IRON, RETICCTPCT in the last 72 hours. Coag Panel:   No results found for: INR, PROTIME  RADIOLOGY: Ct Abdomen Pelvis Wo Contrast  02/17/2015  CLINICAL DATA:  Recent cholecystectomy for gangrenous cholecystitis. Right upper quadrant abdominal pain. EXAM: CT ABDOMEN AND PELVIS WITHOUT CONTRAST TECHNIQUE: Multidetector CT imaging of the abdomen and pelvis was performed following the standard protocol without IV contrast. COMPARISON:  01/25/2015 FINDINGS: Lower chest: Patchy parenchymal densities in the left lower lobe are concerning for a small focus of aspiration or pneumonia. Evidence for coronary artery calcifications. Hepatobiliary: There is a nonmetallic biliary stent with the distal aspect in the duodenum. Cholecystectomy clips are noted. Vague low-density in the gallbladder fossa is suggestive for fluid. This fluid tracks anterior towards the abdominal wall. Tiny focus of gas near the gallbladder fossa. Three small calcific foci between the liver and right kidney upper pole are concerning for retained gallstones. These small calcifications are grouped together and roughly measure up to 1 cm  in size. Inflammatory changes along the right upper anterior abdominal wall related to recent surgery. Limited evaluation of the liver and gallbladder fossa without intravenous contrast. Pancreas: Normal appearance of the pancreas without duct dilatation or inflammation. Spleen: No acute abnormality to the spleen. Adrenals/Urinary Tract: Normal appearance of the adrenal glands. Normal appearance of both kidneys without stones or hydronephrosis. Normal appearance of the urinary bladder. Stomach/Bowel: Moderate stool burden throughout the colon without acute  colonic inflammation. Normal appearance of small bowel without obstruction. There may be mild inflammation of the proximal duodenum adjacent to the liver. Vascular/Lymphatic: Atherosclerotic calcifications in the aorta and iliac arteries without aneurysm. No significant abdominal or pelvic lymphadenopathy. Reproductive: Uterus is surgically absent. Other: No significant free fluid. Musculoskeletal: No suspicious bone lesions. IMPRESSION: Post cholecystectomy. There is a small amount of fluid and gas in the gallbladder fossa with expected inflammatory changes from recent surgery. Nonmetallic biliary stent. Small calcifications between the liver and right kidney are suggestive for retained gallstones. Patchy densities in the left lower lobe are concerning for a small focus of pneumonia or aspiration pneumonitis. Electronically Signed   By: Richarda OverlieAdam  Henn M.D.   On: 02/17/2015 17:26   Dg Chest 2 View  02/17/2015  CLINICAL DATA:  Fever, chest pain, shortness of breath. EXAM: CHEST  2 VIEW COMPARISON:  January 25, 2015. FINDINGS: The heart size and mediastinal contours are within normal limits. Both lungs are clear. No pneumothorax or pleural effusion is noted. The visualized skeletal structures are unremarkable. IMPRESSION: No active cardiopulmonary disease. Electronically Signed   By: Lupita RaiderJames  Green Jr, M.D.   On: 02/17/2015 15:50   X-ray Chest Pa And Lateral  01/25/2015  CLINICAL DATA:  Pt states she has been having stomach pain onset for 4 days. Pt states she has been vomiting and feeling nauseous. EXAM: CHEST  2 VIEW COMPARISON:  03/20/2014 FINDINGS: Cardiac silhouette is borderline enlarged. No mediastinal or hilar masses or pathologically enlarged lymph nodes. There is a right coronary artery stent. Clear lungs.  No pleural effusion or pneumothorax. Bony thorax is intact. IMPRESSION: No acute cardiopulmonary disease. Electronically Signed   By: Amie Portlandavid  Ormond M.D.   On: 01/25/2015 12:40   Nm Hepatobiliary  Including Gb  02/18/2015  CLINICAL DATA:  Status post cholecystectomy, evaluate for bile leak EXAM: NUCLEAR MEDICINE HEPATOBILIARY IMAGING TECHNIQUE: Sequential images of the abdomen were obtained out to 60 minutes following intravenous administration of radiopharmaceutical. RADIOPHARMACEUTICALS:  5.3 mCi Tc-320m  Choletec IV COMPARISON:  CT abdomen pelvis dated 02/17/2015 FINDINGS: Normal hepatic excretion. Within 15 minutes, radiotracer begins to pool along the inferior liver margin. This is clearly extraluminal and reflects a bile leak. Study was aborted after 45 minutes. IMPRESSION: Study is positive for bile leak. These results will be called to the ordering clinician or representative by the Radiologist Assistant, and communication documented in the PACS or zVision Dashboard. Electronically Signed   By: Charline BillsSriyesh  Krishnan M.D.   On: 02/18/2015 14:06   Ct Abdomen Pelvis W Contrast  01/25/2015  CLINICAL DATA:  Diffuse abdominal pain.  Fever and diarrhea EXAM: CT ABDOMEN AND PELVIS WITH CONTRAST TECHNIQUE: Multidetector CT imaging of the abdomen and pelvis was performed using the standard protocol following bolus administration of intravenous contrast. CONTRAST:  50mL OMNIPAQUE IOHEXOL 300 MG/ML SOLN, 100mL OMNIPAQUE IOHEXOL 300 MG/ML SOLN COMPARISON:  None. FINDINGS: Lower chest: Minimal bibasilar atelectasis. No infiltrate or effusion in the lung bases. Heart size upper normal. Hepatobiliary: Gallbladder is distended with gallbladder wall thickening. Mild stranding  in the pericholecystic fat. There is a small gas bubble in the fundus of the gallbladder. Multiple calcified gallstones are present. There is gas in the cystic duct and common bile duct. The common bile duct is nondilated. Anterior to the cystic duct in the porta hepatis there is a multilocular cyst measuring 33 mm in diameter. No additional liver lesions identified. No free air no free fluid. Pancreas: Normal pancreas. Negative for pancreatitis  or pancreatic mass. Spleen: Negative Adrenals/Urinary Tract: Negative for renal obstruction. No renal mass or renal calculi. Urinary bladder normal. Stomach/Bowel: Negative for bowel obstruction. Small hiatal hernia. Negative for bowel edema. Appendix not visualized. Vascular/Lymphatic: Atherosclerotic calcification in the aorta and iliac arteries. Negative for aortic aneurysm. Reproductive: Hysterectomy.  Negative for pelvic mass. Other: No free fluid.  No free air.  Negative for adenopathy. Musculoskeletal: Minimal lumbar degenerative change. No acute skeletal abnormality. IMPRESSION: Cholelithiasis. Distended gallbladder with gallbladder wall thickening and mild pericholecystic edema. Findings suggest acute cholecystitis. In addition, there is gas in the gallbladder lumen as well as in the cystic duct and common bile duct compatible with cholecystitis. 33 mm multilocular cyst in the porta hepatis. This may represent a biliary cystadenoma or septated hepatic cyst. Appendix not visualized. Electronically Signed   By: Marlan Palau M.D.   On: 01/25/2015 07:17   Mr 3d Recon At Scanner  02/18/2015  CLINICAL DATA:  Status post laparoscopic cholecystectomy on 01/27/2015, bile leak via surgical wound, pain/tenderness EXAM: MRI ABDOMEN WITHOUT AND WITH CONTRAST (INCLUDING MRCP) TECHNIQUE: Multiplanar multisequence MR imaging of the abdomen was performed both before and after the administration of intravenous contrast. Heavily T2-weighted images of the biliary and pancreatic ducts were obtained, and three-dimensional MRCP images were rendered by post processing. CONTRAST:  12mL MULTIHANCE GADOBENATE DIMEGLUMINE 529 MG/ML IV SOLN COMPARISON:  Nuclear medicine hepatobiliary scan dated 02/18/2015. CT abdomen pelvis dated 02/09/2015. ERCP dated 02/08/2015. FINDINGS: Lower chest: Mild patchy opacity at the left lung base (series 7/image 6). Cardiomegaly. Hepatobiliary: Liver is notable for mild hepatic steatosis. No  suspicious/enhancing hepatic lesions. Mild increased perfusion along the gallbladder fossa. Status post cholecystectomy. No intrahepatic or extrahepatic ductal dilatation. Common duct measures 5 mm (series 6/image 24). No choledocholithiasis is seen. 1.6 x 2.6 cm complex fluid collection with layering fluid-fluid level in the gallbladder fossa (series 8/image 21), likely reflecting a biloma. This collection is adjacent to susceptibility artifact from a surgical clips (series 8/image 20), likely corresponding to the known bile leak at the cystic duct remnant/stump. Direct drainage of the collection to the skin surface via a catheter tract along the right anterior abdomen and exiting the right lateral abdominal wall (series 8/images 24, 27, and 35). Pancreas: Within normal limits. Spleen: Within normal limits. Adrenals/Urinary Tract: Adrenal glands are within normal limits. Kidneys within normal limits.  No hydronephrosis. Stomach/Bowel: Stomach is within normal limits. Visualized bowel is unremarkable. Vascular/Lymphatic: No evidence of abdominal aortic aneurysm. No suspicious abdominal lymphadenopathy. Other: No abdominal ascites. Musculoskeletal: No focal osseous lesions. IMPRESSION: Status post cholecystectomy. No intrahepatic or extrahepatic ductal dilatation. Common duct measures 5 mm. No choledocholithiasis is seen. 1.6 x 2.6 cm complex fluid collection/biloma in the gallbladder fossa, likely corresponding to the known bile leak at the cystic duct remnant/stump. Drug drainage of the collection to the skin surface deviated a catheter tract along the right anterior abdomen and exiting the right lateral abdominal wall. Additional ancillary findings as above. Electronically Signed   By: Charline Bills M.D.   On: 02/18/2015 19:43  Dg Ercp Biliary & Pancreatic Ducts  02/08/2015  CLINICAL DATA:  Sphincterotomy.  Biliary stent placement. EXAM: ERCP TECHNIQUE: Multiple spot images obtained with the fluoroscopic  device and submitted for interpretation post-procedure. COMPARISON:  CT abdomen and pelvis- 01/25/2015; abdominal ultrasound - 01/25/2015 FINDINGS: Three spot intraoperative images during ERCP are provided for review. Initial image demonstrates an ERCP probe overlying the right upper abdominal quadrant. Surgical clips overlie the expected location of the gallbladder fossa. A surgical drain also overlies the expected location of the gallbladder fossa. Subsequent images demonstrate selective cannulation opacification of the CBD. There is apparent extravasation of injected contrast adjacent to the cholecystectomy clips as well as the cranial end of the surgically placed drain. There is no definitive opacification of the cystic duct though evaluation is degraded secondary to patient respiratory artifact. Completion image demonstrates placement of a internal plastic biliary stent overlying expected location of the CBD. IMPRESSION: 1. ERCP with biliary stent placement as above. 2. Contrast injection demonstrates extravasation of contrast about the cholecystectomy clips as well as the cranial aspect of the surgically placed drain indicative of a biliary leak. The cystic duct was not definitely identified though evaluation is degraded secondary to patient respiration. Correlation with the operative report is recommended. These images were submitted for radiologic interpretation only. Please see the procedural report for the amount of contrast and the fluoroscopy time utilized. Electronically Signed   By: Simonne Come M.D.   On: 02/08/2015 14:56   Mr Abd W/wo Cm/mrcp  02/18/2015  CLINICAL DATA:  Status post laparoscopic cholecystectomy on 01/27/2015, bile leak via surgical wound, pain/tenderness EXAM: MRI ABDOMEN WITHOUT AND WITH CONTRAST (INCLUDING MRCP) TECHNIQUE: Multiplanar multisequence MR imaging of the abdomen was performed both before and after the administration of intravenous contrast. Heavily T2-weighted images  of the biliary and pancreatic ducts were obtained, and three-dimensional MRCP images were rendered by post processing. CONTRAST:  12mL MULTIHANCE GADOBENATE DIMEGLUMINE 529 MG/ML IV SOLN COMPARISON:  Nuclear medicine hepatobiliary scan dated 02/18/2015. CT abdomen pelvis dated 02/09/2015. ERCP dated 02/08/2015. FINDINGS: Lower chest: Mild patchy opacity at the left lung base (series 7/image 6). Cardiomegaly. Hepatobiliary: Liver is notable for mild hepatic steatosis. No suspicious/enhancing hepatic lesions. Mild increased perfusion along the gallbladder fossa. Status post cholecystectomy. No intrahepatic or extrahepatic ductal dilatation. Common duct measures 5 mm (series 6/image 24). No choledocholithiasis is seen. 1.6 x 2.6 cm complex fluid collection with layering fluid-fluid level in the gallbladder fossa (series 8/image 21), likely reflecting a biloma. This collection is adjacent to susceptibility artifact from a surgical clips (series 8/image 20), likely corresponding to the known bile leak at the cystic duct remnant/stump. Direct drainage of the collection to the skin surface via a catheter tract along the right anterior abdomen and exiting the right lateral abdominal wall (series 8/images 24, 27, and 35). Pancreas: Within normal limits. Spleen: Within normal limits. Adrenals/Urinary Tract: Adrenal glands are within normal limits. Kidneys within normal limits.  No hydronephrosis. Stomach/Bowel: Stomach is within normal limits. Visualized bowel is unremarkable. Vascular/Lymphatic: No evidence of abdominal aortic aneurysm. No suspicious abdominal lymphadenopathy. Other: No abdominal ascites. Musculoskeletal: No focal osseous lesions. IMPRESSION: Status post cholecystectomy. No intrahepatic or extrahepatic ductal dilatation. Common duct measures 5 mm. No choledocholithiasis is seen. 1.6 x 2.6 cm complex fluid collection/biloma in the gallbladder fossa, likely corresponding to the known bile leak at the cystic  duct remnant/stump. Drug drainage of the collection to the skin surface deviated a catheter tract along the right anterior abdomen and exiting  the right lateral abdominal wall. Additional ancillary findings as above. Electronically Signed   By: Charline Bills M.D.   On: 02/18/2015 19:43   US Abdomen Limited Ruq  01/25/2015  CLINICAL DATA:  59 year old with acute onset of upper abdominal pain associated with nausea, vomiting and diarrhea which began 3 days ago. EXAM: US ABDOMEN LIMITED - RIGHT UPPER QUADRANT COMPARISON:  CT abdomen and pelvis performed earlier same date. FINDINGS: Gallbladder: Numerous shadowing gallstones and echogenic sludge. Gallbladder wall thickening, likely overestimated on ultrasound due to the gas within the gallbladder lumen as noted on the earlier CT. Positive sonographic Murphy sign according to the ultrasound technologist. Common bile duct: Diameter: Approximately 5 mm diameter. Gas within the common bile duct as noted on the CT. Liver: Multilocular fluid collection centrally in the liver as noted on CT measuring approximately 3.3 x 2.1 cm. No other focal hepatic parenchymal abnormality. Patent portal vein with antegrade flow. IMPRESSION: 1. Cholelithiasis and acute cholecystitis. Gas within the gallbladder fundus and the nondistended common bile duct as noted on earlier CT, likely due to a gas producing organism. Less likely, the gas could arise due to a fistula with adjacent bowel. 2. Multilocular fluid collection centrally in the liver as noted on earlier CT, likely a liver abscess, given its proximity to the infected gallbladder. Electronically Signed   By: Hulan Saas M.D.   On: 01/25/2015 07:59   ASSESSMENT/PLAN: 1. Abnormal EKG - EKGs reviewed as far back as 02/2014 which show a similar appearance to EKG done on admission. These show NSR with LVH and repolarization abnormality.  2. ASCAD with history of remote PCI of the mid RCA with DES in 2012 and other  unknown artery- she denies any anginal CP or SOB prior to her cholecystectomy but since then has had some chest pain in the left upper chest similar to her prior angina but also eased some with belching. This could be due to underlying gastrointestinal issues but also could represent underlying coronary ischemia in the setting of acute stress from underlying illness. Troponin is negative. She did well without any cardiac complications during recent cholecystectomy 01/27/2015. Nuc med stress test in setting of similar appearing EKG 02/2014 showed no ischemia and normal LVF. I will make her NPO after MN for lexiscan myoview in am since ERCP will be done under general anesthesia. 3. Gangrenous GB s/p cholecystectomy now with biliary leak.  4. Abdominal pain, nausea and vomiting secondary to #3 5. Acute renal failure secondary to dehydration and volume depletion.  6. HTN - poorly controlled. Needs aggressive treatment of HTN. BB has been increased. Increase clonidine as needed. 7. Hypokalemia - replete per hospitalist  She has a history of abnormal EKG dating back at least to admission 02/2014 at which time she had a similar appearing EKG and underwent nuclear stress test with no ischemia and normal LVF. Her biggest risk is from poorly controlled HTN. Needs aggressive BP treatment. It is difficult to assess whether her current episodeic chest discomfort is from her underlying GI issues or coronary ischemia in setting of stress from recent illness. Would recommend Lexiscan myoview prior to undergoing ERCP since she is going to have general anesthesia    Quintella Reichert, MD  02/20/2015  8:32 AM

## 2015-02-20 NOTE — Progress Notes (Signed)
     The patient was seen in nuclear medicine for a lexiscan myoview. She tolerated the procedure well. No acute ST or TW changes on ECG. Await nuclear images.    Kathryn Stern PA-C  [facility]     

## 2015-02-20 NOTE — Progress Notes (Addendum)
Patient hospital computer chart and x-rays were reviewed with radiology including previous ERCP and MRCP and her case was discussed with my partner Dr. Bosie ClosSchooler and her ERCP and op note were reviewed and case discussed with surgical team and unfortunately patient has been in nuclear medicine for some time but I am happy to proceed with repeat ERCP and stenting when she is cleared by cardiology and please call me ASAP to get her on the schedule when she is cleared

## 2015-02-20 NOTE — Progress Notes (Signed)
[facility] Progress Note  1 Day Post-Op  Subjective: Pt doing well, no significant pain.  No more nausea.  Having flatus and BM's.  Just got back from a heart scan, pending stress test portion.  Says her side is leaking much less.  Feels overall improved.    Objective: Vital signs in last 24 hours: Temp:  [98.1 F (36.7 C)-99 F (37.2 C)] 98.1 F (36.7 C) (10/31 0356) Pulse Rate:  [82-113] 82 (10/31 0356) Resp:  [18-20] 20 (10/31 0356) BP: (140-227)/(63-88) 155/63 mmHg (10/31 0356) SpO2:  [96 %-100 %] 96 % (10/31 0356) Weight:  [60.1 kg (132 lb 7.9 oz)] 60.1 kg (132 lb 7.9 oz) (10/30 2040) Last BM Date: 02/18/15  Intake/Output from previous day: 10/30 0701 - 10/31 0700 In: 2690 [P.O.:300; I.V.:2390] Out: 1800 [Urine:1800] Intake/Output this shift:    PE: Gen:  Alert, NAD, pleasant Abd: Soft, NT, ND, +BS, no HSM, incisions healed, old drain site in RUQ, fistula bag placed with bilious drainage   Lab Results:   Recent Labs  02/19/15 0832 02/20/15 0505  WBC 10.1 9.1  HGB 10.2* 9.5*  HCT 32.1* 29.1*  PLT 433* 376   BMET  Recent Labs  02/19/15 0832 02/20/15 0505  NA 144 141  K 3.3* 2.9*  CL 110 106  CO2 22 24  GLUCOSE 107* 117*  BUN 6 <5*  CREATININE 0.71 0.59  CALCIUM 9.3 9.1   PT/INR No results for input(s): LABPROT, INR in the last 72 hours. CMP     Component Value Date/Time   NA 141 02/20/2015 0505   K 2.9* 02/20/2015 0505   CL 106 02/20/2015 0505   CO2 24 02/20/2015 0505   GLUCOSE 117* 02/20/2015 0505   BUN <5* 02/20/2015 0505   CREATININE 0.59 02/20/2015 0505   CALCIUM 9.1 02/20/2015 0505   PROT 6.3* 02/20/2015 0505   ALBUMIN 2.8* 02/20/2015 0505   AST 30 02/20/2015 0505   ALT 36 02/20/2015 0505   ALKPHOS 103 02/20/2015 0505   BILITOT 0.6 02/20/2015 0505   GFRNONAA >60 02/20/2015 0505   GFRAA >60 02/20/2015 0505   Lipase     Component Value Date/Time   LIPASE 72* 02/19/2015 0832       Studies/Results: Nm  Hepatobiliary Including Gb  02/18/2015  CLINICAL DATA:  Status post cholecystectomy, evaluate for bile leak EXAM: NUCLEAR MEDICINE HEPATOBILIARY IMAGING TECHNIQUE: Sequential images of the abdomen were obtained out to 60 minutes following intravenous administration of radiopharmaceutical. RADIOPHARMACEUTICALS:  5.3 mCi [id number]  Choletec IV COMPARISON:  CT abdomen pelvis dated 02/17/2015 FINDINGS: Normal hepatic excretion. Within 15 minutes, radiotracer begins to pool along the inferior liver margin. This is clearly extraluminal and reflects a bile leak. Study was aborted after 45 minutes. IMPRESSION: Study is positive for bile leak. These results will be called to the ordering clinician or representative by the Radiologist Assistant, and communication documented in the PACS or zVision Dashboard. Electronically Signed   By: Charline BillsSriyesh  Krishnan M.D.   On: 02/18/2015 14:06   Mr 3d Recon At Scanner  02/18/2015  CLINICAL DATA:  Status post laparoscopic cholecystectomy on 01/27/2015, bile leak via surgical wound, pain/tenderness EXAM: MRI ABDOMEN WITHOUT AND WITH CONTRAST (INCLUDING MRCP) TECHNIQUE: Multiplanar multisequence MR imaging of the abdomen was performed both before and after the administration of intravenous contrast. Heavily T2-weighted images of the biliary and pancreatic ducts were obtained, and three-dimensional MRCP images were rendered by post processing. CONTRAST:  12mL MULTIHANCE GADOBENATE DIMEGLUMINE 529 MG/ML IV SOLN COMPARISON:  Nuclear medicine hepatobiliary scan dated 02/18/2015. CT abdomen pelvis dated 02/09/2015. ERCP dated 02/08/2015. FINDINGS: Lower chest: Mild patchy opacity at the left lung base (series 7/image 6). Cardiomegaly. Hepatobiliary: Liver is notable for mild hepatic steatosis. No suspicious/enhancing hepatic lesions. Mild increased perfusion along the gallbladder fossa. Status post cholecystectomy. No intrahepatic or extrahepatic ductal dilatation. Common duct measures 5 mm  (series 6/image 24). No choledocholithiasis is seen. 1.6 x 2.6 cm complex fluid collection with layering fluid-fluid level in the gallbladder fossa (series 8/image 21), likely reflecting a biloma. This collection is adjacent to susceptibility artifact from a surgical clips (series 8/image 20), likely corresponding to the known bile leak at the cystic duct remnant/stump. Direct drainage of the collection to the skin surface via a catheter tract along the right anterior abdomen and exiting the right lateral abdominal wall (series 8/images 24, 27, and 35). Pancreas: Within normal limits. Spleen: Within normal limits. Adrenals/Urinary Tract: Adrenal glands are within normal limits. Kidneys within normal limits.  No hydronephrosis. Stomach/Bowel: Stomach is within normal limits. Visualized bowel is unremarkable. Vascular/Lymphatic: No evidence of abdominal aortic aneurysm. No suspicious abdominal lymphadenopathy. Other: No abdominal ascites. Musculoskeletal: No focal osseous lesions. IMPRESSION: Status post cholecystectomy. No intrahepatic or extrahepatic ductal dilatation. Common duct measures 5 mm. No choledocholithiasis is seen. 1.6 x 2.6 cm complex fluid collection/biloma in the gallbladder fossa, likely corresponding to the known bile leak at the cystic duct remnant/stump. Drug drainage of the collection to the skin surface deviated a catheter tract along the right anterior abdomen and exiting the right lateral abdominal wall. Additional ancillary findings as above. Electronically Signed   By: Charline Bills M.D.   On: 02/18/2015 19:43   Mr Abd W/wo Cm/mrcp  02/18/2015  CLINICAL DATA:  Status post laparoscopic cholecystectomy on 01/27/2015, bile leak via surgical wound, pain/tenderness EXAM: MRI ABDOMEN WITHOUT AND WITH CONTRAST (INCLUDING MRCP) TECHNIQUE: Multiplanar multisequence MR imaging of the abdomen was performed both before and after the administration of intravenous contrast. Heavily T2-weighted  images of the biliary and pancreatic ducts were obtained, and three-dimensional MRCP images were rendered by post processing. CONTRAST:  12mL MULTIHANCE GADOBENATE DIMEGLUMINE 529 MG/ML IV SOLN COMPARISON:  Nuclear medicine hepatobiliary scan dated 02/18/2015. CT abdomen pelvis dated 02/09/2015. ERCP dated 02/08/2015. FINDINGS: Lower chest: Mild patchy opacity at the left lung base (series 7/image 6). Cardiomegaly. Hepatobiliary: Liver is notable for mild hepatic steatosis. No suspicious/enhancing hepatic lesions. Mild increased perfusion along the gallbladder fossa. Status post cholecystectomy. No intrahepatic or extrahepatic ductal dilatation. Common duct measures 5 mm (series 6/image 24). No choledocholithiasis is seen. 1.6 x 2.6 cm complex fluid collection with layering fluid-fluid level in the gallbladder fossa (series 8/image 21), likely reflecting a biloma. This collection is adjacent to susceptibility artifact from a surgical clips (series 8/image 20), likely corresponding to the known bile leak at the cystic duct remnant/stump. Direct drainage of the collection to the skin surface via a catheter tract along the right anterior abdomen and exiting the right lateral abdominal wall (series 8/images 24, 27, and 35). Pancreas: Within normal limits. Spleen: Within normal limits. Adrenals/Urinary Tract: Adrenal glands are within normal limits. Kidneys within normal limits.  No hydronephrosis. Stomach/Bowel: Stomach is within normal limits. Visualized bowel is unremarkable. Vascular/Lymphatic: No evidence of abdominal aortic aneurysm. No suspicious abdominal lymphadenopathy. Other: No abdominal ascites. Musculoskeletal: No focal osseous lesions. IMPRESSION: Status post cholecystectomy. No intrahepatic or extrahepatic ductal dilatation. Common duct measures 5 mm. No choledocholithiasis is seen. 1.6 x 2.6 cm complex fluid collection/biloma  in the gallbladder fossa, likely corresponding to the known bile leak at the  cystic duct remnant/stump. Drug drainage of the collection to the skin surface deviated a catheter tract along the right anterior abdomen and exiting the right lateral abdominal wall. Additional ancillary findings as above. Electronically Signed   By: Charline Bills M.D.   On: 02/18/2015 19:43    Anti-infectives: Anti-infectives    Start     Dose/Rate Route Frequency Ordered Stop   02/20/15 0900  ciprofloxacin (CIPRO) IVPB 400 mg     400 mg 200 mL/hr over 60 Minutes Intravenous Every 12 hours 02/20/15 0848     02/17/15 2000  ciprofloxacin (CIPRO) IVPB 200 mg  Status:  Discontinued     200 mg 100 mL/hr over 60 Minutes Intravenous Every 12 hours 02/17/15 1911 02/20/15 0848   02/17/15 1915  metroNIDAZOLE (FLAGYL) IVPB 500 mg     500 mg 100 mL/hr over 60 Minutes Intravenous Every 8 hours 02/17/15 1911         Assessment/Plan Postoperative bile leak at cystic duct remnant/stump. s/p open chole Dr. Lovell Sheehan, s/p JP drain removal in office -There is not enough fluid in the abdomen for interventional radiology to place a drain. -Drainage is emptying out through skin where previous JP was in large amounts -Nothing by mouth -Continue Cipro and Flagyl -WOC following to help with fistula pouch to control drainage, protect skin, and quantitate drainage -No plans for surgical intervention at this time because of hostile abdomen this far out from surgery -Treatment plan per GI  Status post recent open cholecystectomy for gangrenous cholecystitis (01/27/2015). Some difficulty in controlling cystic duct reported by surgeon. -No retained stones seen on MRCP -F/u with Dr. Lovell Sheehan upon discharge  Status post recent ERCP with stent placement.(02/08/2015)  -Stent appears to be in proper position. Question whether stent is working or is occluded. Dr. Council Mechanic. Leone Payor to assess. ERCP planned with cholangiogram, and possible new stent placement.  Was cancelled yesterday due to EKG changes  yesterday.    History myocardial infarction, stent placed in mid right RCA 2012 -Pending lexiscan prior to proceeding with ERCP with general anesthesia per cards. Systemic lupus Anxiety  Hypertension uncontrolled Peptic ulcer disease.  Status post open cholecystectomy. (recent) Status post abdominal hysterectomy.    LOS: 3 days    Nonie Hoyer 02/20/2015, 9:51 AM Pager: [phone]

## 2015-02-20 NOTE — Progress Notes (Signed)
[facility]                                                                              Patient Demographics  Sheila [name], is a 59 y.o. female, DOB - [birth year], ZOX:[id number]  Admit date - 02/17/2015   Admitting Physician Starleen Arms, MD  Outpatient Primary MD for the patient is [name], MD  LOS - 3   Chief Complaint  Patient presents with  . Abdominal Pain       Brief HPI  Per Dr.Elgergawy on 02/17/15 Sheila [name] is a 59 y.o. female, past medical history of coronary artery disease, hypertension, lupus and hyperlipidemia, patient with recent hospitalization at [facility], underwent open cholecystectomy on 01/27/2015, she was found to have gangrenous gallbladder and empyema, had JP drain placed in subhepatic space is due to the necrotic nature of the cystic duct, patient continues to have significant output, underwent ERCP on 02/07/2015 with biliary stent placement, patient was seen yesterday by surgery, had JP drain discontinued, patient complains of abdominal pain, nausea vomiting, report this has been constant during the entire 3 weeks, has worsened over the last 2 weeks, as was sent to ED by her PCP today, workup in ED was significant for worsening LFTs, and acute renal failure with creatinine of 2.2, mild leukocytosis of 11.7, had CT abdomen pelvis done which did show fluid collection in gallbladder fossa tracking anteriorly. Hospitalist service requested for admission   Assessment & Plan    Principal problem Abdominal pain, nausea and vomiting secondary to continuous biliary leak.: LFTs are trending down, lipase is trending down  -likely due to continuous biliary leak, patient status post biliary stent placement, her JP drain was discontinued on 10/27. Imagings noted by interventional radiology, noted did not large enough fluid collection to drain.  - HIDA scan + for biliary leak and MRCP with 1.6 x 2.6 cm complex fluid  collection/bilioma in the gallbladder fossa  - Continue IV Cipro and Flagyl, fluids, still NPO   - ERCP pending, GI, surgery following - Fistula pouch placed to the biliary leak  Active problems  Hypokalemia - Replaced oral and IV, recheck later, magnesium as well  Abnormal EKG on admission - EKG on admission had shown ST depression in inferior leads. No chest pain or SOB. Troponin 0, repeat ekg with no ischemic changes. Prior hx of CAD, hx MI 2012, mid RCA stent, (Dr Diona Browner in [place]), nuc med stress test in 02/2014 had shown no reversible ischemia or infarction, normal EF 61%   - ERCP cancelled until cardiology clearance. Patient seen by cardiology, recommended nuclear medicine stress test, today  Acute renal failure likely related to dehydration and volume depletion from her nausea and vomiting. -Resolved with IV fluids, avoid nephrotoxic medications.  Elevated LFTs - Most likely related to her biliary leak, improving  Elevated lipase - Mildly elevated, no evidence of pancreatitis on CT abdomen and pelvis  Hypertension - Still somewhat uncontrolled, increase metoprolol to 50 mg twice a day, added Norvasc yesterday  Code Status: Full code   Family Communication: Discussed in detail with the patient, all imaging results, lab results explained  to the patient  and husband   Disposition Plan: Not medically ready   Time Spent in minutes 25 mins   Procedures  CT abdomen   Consults   Interventional radiology   GI Surgery  DVT Prophylaxis heparin subcutaneous   Medications  Scheduled Meds: . amLODipine  5 mg Oral Daily  . ciprofloxacin  400 mg Intravenous Q12H  . cloNIDine  0.1 mg Oral BID  . docusate sodium  100 mg Oral BID  . escitalopram  10 mg Oral Daily  . heparin  5,000 Units Subcutaneous 3 times per day  . metoprolol  50 mg Oral BID  . metronidazole  500 mg Intravenous Q8H  . pantoprazole  40 mg Oral Daily  . potassium chloride  10 mEq Intravenous Q1 Hr  x 3  . potassium chloride  40 mEq Oral Q4H  . regadenoson      . regadenoson  0.4 mg Intravenous Once  . sodium chloride  3 mL Intravenous Q12H   Continuous Infusions: . sodium chloride 100 mL/hr at 02/19/15 0839  . lactated ringers     PRN Meds:.hydrALAZINE, morphine injection, ondansetron (ZOFRAN) IV, promethazine, technetium TC 78M mebrofenin   Antibiotics   Anti-infectives    Start     Dose/Rate Route Frequency Ordered Stop   02/20/15 0900  ciprofloxacin (CIPRO) IVPB 400 mg     400 mg 200 mL/hr over 60 Minutes Intravenous Every 12 hours 02/20/15 0848     02/17/15 2000  ciprofloxacin (CIPRO) IVPB 200 mg  Status:  Discontinued     200 mg 100 mL/hr over 60 Minutes Intravenous Every 12 hours 02/17/15 1911 02/20/15 0848   02/17/15 1915  metroNIDAZOLE (FLAGYL) IVPB 500 mg     500 mg 100 mL/hr over 60 Minutes Intravenous Every 8 hours 02/17/15 1911          Subjective:   [name] was seen and examined today. Abdominal pain improving, fistula pouch placed. No chest pain or shortness of breath. Still having some right abdominal area pain, improving, denies N/V/D/C, new weakness, numbess, tingling. No acute events overnight.    Objective:   Blood pressure 178/76, pulse 82, temperature 98.1 F (36.7 C), temperature source Oral, resp. rate 20, height 5\' 1"  (1.549 m), weight 60.1 kg (132 lb 7.9 oz), SpO2 96 %.  Wt Readings from Last 3 Encounters:  02/19/15 60.1 kg (132 lb 7.9 oz)  02/08/15 63.957 kg (141 lb)  02/07/15 67.132 kg (148 lb)     Intake/Output Summary (Last 24 hours) at 02/20/15 1141 Last data filed at 02/20/15 1043  Gross per 24 hour  Intake 2690.01 ml  Output   1300 ml  Net 1390.01 ml    Exam  General: Alert and oriented x 3, NAD  HEENT:  PERRLA, EOMI   Neck: Supple, no JVD, no masses  CVS: S1 S2 clear, RR  Respiratory: CTAB  Abdomen: Soft, fistula pouch to right upper quadrant area, no significant tenderness, NBS  Ext: no cyanosis  clubbing or edema  Neuro: no new deficits  Skin: No rashes  Psych: Normal affect and demeanor, alert and oriented x3    Data Review   Micro Results No results found for this or any previous visit (from the past 240 hour(s)).  Radiology Reports Ct Abdomen Pelvis Wo Contrast  02/17/2015  CLINICAL DATA:  Recent cholecystectomy for gangrenous cholecystitis. Right upper quadrant abdominal pain. EXAM: CT ABDOMEN AND PELVIS WITHOUT CONTRAST TECHNIQUE: Multidetector CT imaging of the abdomen and pelvis was  performed following the standard protocol without IV contrast. COMPARISON:  01/25/2015 FINDINGS: Lower chest: Patchy parenchymal densities in the left lower lobe are concerning for a small focus of aspiration or pneumonia. Evidence for coronary artery calcifications. Hepatobiliary: There is a nonmetallic biliary stent with the distal aspect in the duodenum. Cholecystectomy clips are noted. Vague low-density in the gallbladder fossa is suggestive for fluid. This fluid tracks anterior towards the abdominal wall. Tiny focus of gas near the gallbladder fossa. Three small calcific foci between the liver and right kidney upper pole are concerning for retained gallstones. These small calcifications are grouped together and roughly measure up to 1 cm in size. Inflammatory changes along the right upper anterior abdominal wall related to recent surgery. Limited evaluation of the liver and gallbladder fossa without intravenous contrast. Pancreas: Normal appearance of the pancreas without duct dilatation or inflammation. Spleen: No acute abnormality to the spleen. Adrenals/Urinary Tract: Normal appearance of the adrenal glands. Normal appearance of both kidneys without stones or hydronephrosis. Normal appearance of the urinary bladder. Stomach/Bowel: Moderate stool burden throughout the colon without acute colonic inflammation. Normal appearance of small bowel without obstruction. There may be mild inflammation of  the proximal duodenum adjacent to the liver. Vascular/Lymphatic: Atherosclerotic calcifications in the aorta and iliac arteries without aneurysm. No significant abdominal or pelvic lymphadenopathy. Reproductive: Uterus is surgically absent. Other: No significant free fluid. Musculoskeletal: No suspicious bone lesions. IMPRESSION: Post cholecystectomy. There is a small amount of fluid and gas in the gallbladder fossa with expected inflammatory changes from recent surgery. Nonmetallic biliary stent. Small calcifications between the liver and right kidney are suggestive for retained gallstones. Patchy densities in the left lower lobe are concerning for a small focus of pneumonia or aspiration pneumonitis. Electronically Signed   By: Richarda Overlie M.D.   On: 02/17/2015 17:26   Dg Chest 2 View  02/17/2015  CLINICAL DATA:  Fever, chest pain, shortness of breath. EXAM: CHEST  2 VIEW COMPARISON:  January 25, 2015. FINDINGS: The heart size and mediastinal contours are within normal limits. Both lungs are clear. No pneumothorax or pleural effusion is noted. The visualized skeletal structures are unremarkable. IMPRESSION: No active cardiopulmonary disease. Electronically Signed   By: Lupita Raider, M.D.   On: 02/17/2015 15:50   X-ray Chest Pa And Lateral  01/25/2015  CLINICAL DATA:  Pt states she has been having stomach pain onset for 4 days. Pt states she has been vomiting and feeling nauseous. EXAM: CHEST  2 VIEW COMPARISON:  03/20/2014 FINDINGS: Cardiac silhouette is borderline enlarged. No mediastinal or hilar masses or pathologically enlarged lymph nodes. There is a right coronary artery stent. Clear lungs.  No pleural effusion or pneumothorax. Bony thorax is intact. IMPRESSION: No acute cardiopulmonary disease. Electronically Signed   By: Amie Portland M.D.   On: 01/25/2015 12:40   Nm Hepatobiliary Including Gb  02/18/2015  CLINICAL DATA:  Status post cholecystectomy, evaluate for bile leak EXAM: NUCLEAR  MEDICINE HEPATOBILIARY IMAGING TECHNIQUE: Sequential images of the abdomen were obtained out to 60 minutes following intravenous administration of radiopharmaceutical. RADIOPHARMACEUTICALS:  5.3 mCi Tc-58m  Choletec IV COMPARISON:  CT abdomen pelvis dated 02/17/2015 FINDINGS: Normal hepatic excretion. Within 15 minutes, radiotracer begins to pool along the inferior liver margin. This is clearly extraluminal and reflects a bile leak. Study was aborted after 45 minutes. IMPRESSION: Study is positive for bile leak. These results will be called to the ordering clinician or representative by the Radiologist Assistant, and communication documented in the  PACS or zVision Dashboard. Electronically Signed   By: Charline Bills M.D.   On: 02/18/2015 14:06   Ct Abdomen Pelvis W Contrast  01/25/2015  CLINICAL DATA:  Diffuse abdominal pain.  Fever and diarrhea EXAM: CT ABDOMEN AND PELVIS WITH CONTRAST TECHNIQUE: Multidetector CT imaging of the abdomen and pelvis was performed using the standard protocol following bolus administration of intravenous contrast. CONTRAST:  50mL OMNIPAQUE IOHEXOL 300 MG/ML SOLN, OMNIPAQUE IOHEXOL 300 MG/ML SOLN COMPARISON:  None. FINDINGS: Lower chest: Minimal bibasilar atelectasis. No infiltrate or effusion in the lung bases. Heart size upper normal. Hepatobiliary: Gallbladder is distended with gallbladder wall thickening. Mild stranding in the pericholecystic fat. There is a small gas bubble in the fundus of the gallbladder. Multiple calcified gallstones are present. There is gas in the cystic duct and common bile duct. The common bile duct is nondilated. Anterior to the cystic duct in the porta hepatis there is a multilocular cyst measuring 33 mm in diameter. No additional liver lesions identified. No free air no free fluid. Pancreas: Normal pancreas. Negative for pancreatitis or pancreatic mass. Spleen: Negative Adrenals/Urinary Tract: Negative for renal obstruction. No renal mass or  renal calculi. Urinary bladder normal. Stomach/Bowel: Negative for bowel obstruction. Small hiatal hernia. Negative for bowel edema. Appendix not visualized. Vascular/Lymphatic: Atherosclerotic calcification in the aorta and iliac arteries. Negative for aortic aneurysm. Reproductive: Hysterectomy.  Negative for pelvic mass. Other: No free fluid.  No free air.  Negative for adenopathy. Musculoskeletal: Minimal lumbar degenerative change. No acute skeletal abnormality. IMPRESSION: Cholelithiasis. Distended gallbladder with gallbladder wall thickening and mild pericholecystic edema. Findings suggest acute cholecystitis. In addition, there is gas in the gallbladder lumen as well as in the cystic duct and common bile duct compatible with cholecystitis. 33 mm multilocular cyst in the porta hepatis. This may represent a biliary cystadenoma or septated hepatic cyst. Appendix not visualized. Electronically Signed   By: Marlan Palau M.D.   On: 01/25/2015 07:17   Mr 3d Recon At Scanner  02/18/2015  CLINICAL DATA:  Status post laparoscopic cholecystectomy on 01/27/2015, bile leak via surgical wound, pain/tenderness EXAM: MRI ABDOMEN WITHOUT AND WITH CONTRAST (INCLUDING MRCP) TECHNIQUE: Multiplanar multisequence MR imaging of the abdomen was performed both before and after the administration of intravenous contrast. Heavily T2-weighted images of the biliary and pancreatic ducts were obtained, and three-dimensional MRCP images were rendered by post processing. CONTRAST:  12mL MULTIHANCE GADOBENATE DIMEGLUMINE 529 MG/ML IV SOLN COMPARISON:  Nuclear medicine hepatobiliary scan dated 02/18/2015. CT abdomen pelvis dated 02/09/2015. ERCP dated 02/08/2015. FINDINGS: Lower chest: Mild patchy opacity at the left lung base (series 7/image 6). Cardiomegaly. Hepatobiliary: Liver is notable for mild hepatic steatosis. No suspicious/enhancing hepatic lesions. Mild increased perfusion along the gallbladder fossa. Status post  cholecystectomy. No intrahepatic or extrahepatic ductal dilatation. Common duct measures 5 mm (series 6/image 24). No choledocholithiasis is seen. 1.6 x 2.6 cm complex fluid collection with layering fluid-fluid level in the gallbladder fossa (series 8/image 21), likely reflecting a biloma. This collection is adjacent to susceptibility artifact from a surgical clips (series 8/image 20), likely corresponding to the known bile leak at the cystic duct remnant/stump. Direct drainage of the collection to the skin surface via a catheter tract along the right anterior abdomen and exiting the right lateral abdominal wall (series 8/images 24, 27, and 35). Pancreas: Within normal limits. Spleen: Within normal limits. Adrenals/Urinary Tract: Adrenal glands are within normal limits. Kidneys within normal limits.  No hydronephrosis. Stomach/Bowel: Stomach is within normal limits. Visualized  bowel is unremarkable. Vascular/Lymphatic: No evidence of abdominal aortic aneurysm. No suspicious abdominal lymphadenopathy. Other: No abdominal ascites. Musculoskeletal: No focal osseous lesions. IMPRESSION: Status post cholecystectomy. No intrahepatic or extrahepatic ductal dilatation. Common duct measures 5 mm. No choledocholithiasis is seen. 1.6 x 2.6 cm complex fluid collection/biloma in the gallbladder fossa, likely corresponding to the known bile leak at the cystic duct remnant/stump. Drug drainage of the collection to the skin surface deviated a catheter tract along the right anterior abdomen and exiting the right lateral abdominal wall. Additional ancillary findings as above. Electronically Signed   By: Charline BillsSriyesh  Krishnan M.D.   On: 02/18/2015 19:43   Dg Ercp Biliary & Pancreatic Ducts  02/08/2015  CLINICAL DATA:  Sphincterotomy.  Biliary stent placement. EXAM: ERCP TECHNIQUE: Multiple spot images obtained with the fluoroscopic device and submitted for interpretation post-procedure. COMPARISON:  CT abdomen and pelvis- 01/25/2015;  abdominal ultrasound - 01/25/2015 FINDINGS: Three spot intraoperative images during ERCP are provided for review. Initial image demonstrates an ERCP probe overlying the right upper abdominal quadrant. Surgical clips overlie the expected location of the gallbladder fossa. A surgical drain also overlies the expected location of the gallbladder fossa. Subsequent images demonstrate selective cannulation opacification of the CBD. There is apparent extravasation of injected contrast adjacent to the cholecystectomy clips as well as the cranial end of the surgically placed drain. There is no definitive opacification of the cystic duct though evaluation is degraded secondary to patient respiratory artifact. Completion image demonstrates placement of a internal plastic biliary stent overlying expected location of the CBD. IMPRESSION: 1. ERCP with biliary stent placement as above. 2. Contrast injection demonstrates extravasation of contrast about the cholecystectomy clips as well as the cranial aspect of the surgically placed drain indicative of a biliary leak. The cystic duct was not definitely identified though evaluation is degraded secondary to patient respiration. Correlation with the operative report is recommended. These images were submitted for radiologic interpretation only. Please see the procedural report for the amount of contrast and the fluoroscopy time utilized. Electronically Signed   By: Simonne ComeJohn  Watts M.D.   On: 02/08/2015 14:56   Mr Abd W/wo Cm/mrcp  02/18/2015  CLINICAL DATA:  Status post laparoscopic cholecystectomy on 01/27/2015, bile leak via surgical wound, pain/tenderness EXAM: MRI ABDOMEN WITHOUT AND WITH CONTRAST (INCLUDING MRCP) TECHNIQUE: Multiplanar multisequence MR imaging of the abdomen was performed both before and after the administration of intravenous contrast. Heavily T2-weighted images of the biliary and pancreatic ducts were obtained, and three-dimensional MRCP images were rendered by  post processing. CONTRAST:  12mL MULTIHANCE GADOBENATE DIMEGLUMINE 529 MG/ML IV SOLN COMPARISON:  Nuclear medicine hepatobiliary scan dated 02/18/2015. CT abdomen pelvis dated 02/09/2015. ERCP dated 02/08/2015. FINDINGS: Lower chest: Mild patchy opacity at the left lung base (series 7/image 6). Cardiomegaly. Hepatobiliary: Liver is notable for mild hepatic steatosis. No suspicious/enhancing hepatic lesions. Mild increased perfusion along the gallbladder fossa. Status post cholecystectomy. No intrahepatic or extrahepatic ductal dilatation. Common duct measures 5 mm (series 6/image 24). No choledocholithiasis is seen. 1.6 x 2.6 cm complex fluid collection with layering fluid-fluid level in the gallbladder fossa (series 8/image 21), likely reflecting a biloma. This collection is adjacent to susceptibility artifact from a surgical clips (series 8/image 20), likely corresponding to the known bile leak at the cystic duct remnant/stump. Direct drainage of the collection to the skin surface via a catheter tract along the right anterior abdomen and exiting the right lateral abdominal wall (series 8/images 24, 27, and 35). Pancreas: Within normal limits. Spleen:  Within normal limits. Adrenals/Urinary Tract: Adrenal glands are within normal limits. Kidneys within normal limits.  No hydronephrosis. Stomach/Bowel: Stomach is within normal limits. Visualized bowel is unremarkable. Vascular/Lymphatic: No evidence of abdominal aortic aneurysm. No suspicious abdominal lymphadenopathy. Other: No abdominal ascites. Musculoskeletal: No focal osseous lesions. IMPRESSION: Status post cholecystectomy. No intrahepatic or extrahepatic ductal dilatation. Common duct measures 5 mm. No choledocholithiasis is seen. 1.6 x 2.6 cm complex fluid collection/biloma in the gallbladder fossa, likely corresponding to the known bile leak at the cystic duct remnant/stump. Drug drainage of the collection to the skin surface deviated a catheter tract along  the right anterior abdomen and exiting the right lateral abdominal wall. Additional ancillary findings as above. Electronically Signed   By: Charline Bills M.D.   On: 02/18/2015 19:43   US Abdomen Limited Ruq  01/25/2015  CLINICAL DATA:  59 year old with acute onset of upper abdominal pain associated with nausea, vomiting and diarrhea which began 3 days ago. EXAM: US ABDOMEN LIMITED - RIGHT UPPER QUADRANT COMPARISON:  CT abdomen and pelvis performed earlier same date. FINDINGS: Gallbladder: Numerous shadowing gallstones and echogenic sludge. Gallbladder wall thickening, likely overestimated on ultrasound due to the gas within the gallbladder lumen as noted on the earlier CT. Positive sonographic Murphy sign according to the ultrasound technologist. Common bile duct: Diameter: Approximately 5 mm diameter. Gas within the common bile duct as noted on the CT. Liver: Multilocular fluid collection centrally in the liver as noted on CT measuring approximately 3.3 x 2.1 cm. No other focal hepatic parenchymal abnormality. Patent portal vein with antegrade flow. IMPRESSION: 1. Cholelithiasis and acute cholecystitis. Gas within the gallbladder fundus and the nondistended common bile duct as noted on earlier CT, likely due to a gas producing organism. Less likely, the gas could arise due to a fistula with adjacent bowel. 2. Multilocular fluid collection centrally in the liver as noted on earlier CT, likely a liver abscess, given its proximity to the infected gallbladder. Electronically Signed   By: Hulan Saas M.D.   On: 01/25/2015 07:59    CBC  Recent Labs Lab 02/17/15 1459 02/18/15 0825 02/19/15 0832 02/20/15 0505  WBC 11.7* 9.3 10.1 9.1  HGB 12.9 9.6* 10.2* 9.5*  HCT 38.7 29.6* 32.1* 29.1*  PLT 674* 482* 433* 376  MCV 87.8 88.9 90.2 89.8  MCH 29.3 28.8 28.7 29.3  MCHC 33.3 32.4 31.8 32.6  RDW 13.6 13.8 13.9 14.0    Chemistries   Recent Labs Lab 02/17/15 1459 02/18/15 0825 02/19/15 0832  02/20/15 0505  NA 136 143 144 141  K 3.2* 3.7 3.3* 2.9*  CL 101 114* 110 106  CO2 20* 17* 22 24  GLUCOSE 127* 104* 107* 117*  BUN 45* 24* 6 <5*  CREATININE 2.22* 0.95 0.71 0.59  CALCIUM 10.3 9.1 9.3 9.1  AST 81* 45* 29 30  ALT 80* 54 42 36  ALKPHOS 151* 106 107 103  BILITOT 0.8 0.8 0.7 0.6   ------------------------------------------------------------------------------------------------------------------ estimated creatinine clearance is 61.4 mL/min (by C-G formula based on Cr of 0.59). ------------------------------------------------------------------------------------------------------------------ No results for input(s): HGBA1C in the last 72 hours. ------------------------------------------------------------------------------------------------------------------ No results for input(s): CHOL, HDL, LDLCALC, TRIG, CHOLHDL, LDLDIRECT in the last 72 hours. ------------------------------------------------------------------------------------------------------------------ No results for input(s): TSH, T4TOTAL, T3FREE, THYROIDAB in the last 72 hours.  Invalid input(s): FREET3 ------------------------------------------------------------------------------------------------------------------ No results for input(s): VITAMINB12, FOLATE, FERRITIN, TIBC, IRON, RETICCTPCT in the last 72 hours.  Coagulation profile No results for input(s): INR, PROTIME in the last 168 hours.  No results  for input(s): DDIMER in the last 72 hours.  Cardiac Enzymes No results for input(s): CKMB, TROPONINI, MYOGLOBIN in the last 168 hours.  Invalid input(s): CK ------------------------------------------------------------------------------------------------------------------ Invalid input(s): POCBNP  No results for input(s): GLUCAP in the last 72 hours.   RAI,RIPUDEEP M.D. [facility] 02/20/2015, 11:41 AM  Pager: [phone] Between 7am to 7pm - call Pager - [phone]  After 7pm go to  [email or web address] - password [email or web address]  Call night coverage person covering after 7pm

## 2015-02-21 DIAGNOSIS — R079 Chest pain, unspecified: Secondary | ICD-10-CM | POA: Diagnosis present

## 2015-02-21 LAB — COMPREHENSIVE METABOLIC PANEL
ALT: 27 U/L (ref 14–54)
AST: 20 U/L (ref 15–41)
Albumin: 2.7 g/dL — ABNORMAL LOW (ref 3.5–5.0)
Alkaline Phosphatase: 90 U/L (ref 38–126)
Anion gap: 8 (ref 5–15)
BILIRUBIN TOTAL: 0.6 mg/dL (ref 0.3–1.2)
BUN: <5 mg/dL — ABNORMAL LOW (ref 6–20)
CHLORIDE: 100 mmol/L — AB (ref 101–111)
CO2: 26 mmol/L (ref 22–32)
CREATININE: 0.59 mg/dL (ref 0.44–1.00)
Calcium: 8.3 mg/dL — ABNORMAL LOW (ref 8.9–10.3)
Glucose, Bld: 115 mg/dL — ABNORMAL HIGH (ref 65–99)
Potassium: 3 mmol/L — ABNORMAL LOW (ref 3.5–5.1)
Sodium: 134 mmol/L — ABNORMAL LOW (ref 135–145)
TOTAL PROTEIN: 6 g/dL — AB (ref 6.5–8.1)

## 2015-02-21 MED ORDER — POTASSIUM CHLORIDE CRYS ER 20 MEQ PO TBCR: 40.0000 meq | EXTENDED_RELEASE_TABLET | Freq: Once | ORAL | Status: DC

## 2015-02-21 MED ORDER — POTASSIUM CHLORIDE CRYS ER 20 MEQ PO TBCR
40.0000 meq | EXTENDED_RELEASE_TABLET | Freq: Every day | ORAL | Status: DC
Start: 2015-02-21 — End: 2015-02-22
  Administered 2015-02-21: 40 meq via ORAL
  Filled 2015-02-21: qty 2

## 2015-02-21 MED ORDER — ZOLPIDEM TARTRATE 5 MG PO TABS: 5.0000 mg | ORAL_TABLET | Freq: Once | ORAL | Status: AC

## 2015-02-21 MED ORDER — ISOSORBIDE MONONITRATE ER 30 MG PO TB24
30.0000 mg | ORAL_TABLET | Freq: Every day | ORAL | Status: DC
  Administered 2015-02-21 – 2015-02-23 (×3): 30 mg via ORAL
  Filled 2015-02-21 (×3): qty 1

## 2015-02-21 MED ORDER — ZOLPIDEM TARTRATE 5 MG PO TABS
5.0000 mg | ORAL_TABLET | Freq: Every evening | ORAL | Status: DC | PRN
  Administered 2015-02-21 – 2015-02-22 (×3): 5 mg via ORAL
  Filled 2015-02-21 (×3): qty 1

## 2015-02-21 NOTE — Progress Notes (Signed)
Patient ID: Sheila [name], female   DOB: 1955/05/16, 59 y.o.   MRN: [id number] 2 Days Post-Op  Subjective: Pt feels well today.  Hasn't taken any pain meds since last night.  Output down to maybe 100cc over the last 3 days total.  Objective: Vital signs in last 24 hours: Temp:  [97.8 F (36.6 C)-98.8 F (37.1 C)] 98.1 F (36.7 C) (11/01 1025) Pulse Rate:  [72-94] 79 (11/01 1025) Resp:  [18-20] 18 (11/01 1025) BP: (146-197)/(57-87) 148/72 mmHg (11/01 1025) SpO2:  [96 %-100 %] 100 % (11/01 1025) Last BM Date: 02/18/15  Intake/Output from previous day: 10/31 0701 - 11/01 0700 In: 3800 [P.O.:390; I.V.:2410; IV Piggyback:1000] Out: 0  Intake/Output this shift:    PE: Abd: soft, minimally tender, ostomy appliance with approximately 100cc of bilious output, hasn't been emptied in 3 days per patient.  Lab Results:   Recent Labs  02/19/15 0832 02/20/15 0505  WBC 10.1 9.1  HGB 10.2* 9.5*  HCT 32.1* 29.1*  PLT 433* 376   BMET  Recent Labs  02/20/15 0505 02/20/15 1558 02/21/15 0555  NA 141  --  134*  K 2.9* 3.4* 3.0*  CL 106  --  100*  CO2 24  --  26  GLUCOSE 117*  --  115*  BUN <5*  --  <5*  CREATININE 0.59  --  0.59  CALCIUM 9.1  --  8.3*   PT/INR No results for input(s): LABPROT, INR in the last 72 hours. CMP     Component Value Date/Time   NA 134* 02/21/2015 0555   K 3.0* 02/21/2015 0555   CL 100* 02/21/2015 0555   CO2 26 02/21/2015 0555   GLUCOSE 115* 02/21/2015 0555   BUN <5* 02/21/2015 0555   CREATININE 0.59 02/21/2015 0555   CALCIUM 8.3* 02/21/2015 0555   PROT 6.0* 02/21/2015 0555   ALBUMIN 2.7* 02/21/2015 0555   AST 20 02/21/2015 0555   ALT 27 02/21/2015 0555   ALKPHOS 90 02/21/2015 0555   BILITOT 0.6 02/21/2015 0555   GFRNONAA >60 02/21/2015 0555   GFRAA >60 02/21/2015 0555   Lipase     Component Value Date/Time   LIPASE 72* 02/19/2015 0832       Studies/Results: Nm Myocar Multi W/spect W/wall Motion / Ef  02/20/2015  CLINICAL DATA:   Chest pain.  Coronary artery disease. EXAM: MYOCARDIAL IMAGING WITH SPECT (REST AND PHARMACOLOGIC-STRESS) GATED LEFT VENTRICULAR WALL MOTION STUDY LEFT VENTRICULAR EJECTION FRACTION TECHNIQUE: Standard myocardial SPECT imaging was performed after resting intravenous injection of 10 mCi [id number] sestamibi. Subsequently, intravenous infusion of Lexiscan was performed under the supervision of the Cardiology staff. At peak effect of the drug, 30 mCi [id number] sestamibi was injected intravenously and standard myocardial SPECT imaging was performed. Quantitative gated imaging was also performed to evaluate left ventricular wall motion, and estimate left ventricular ejection fraction. COMPARISON:  03/21/2014 FINDINGS: Perfusion: 2 reversible defects involving the lateral wall. The smaller defect is located along the anterior aspect of the lateral wall near the apex and the other slightly larger defect is located in the midportion of the lateral wall posteriorly. Wall Motion: Lateral wall motion abnormalities. The remaining walls of the left ventricle demonstrate normal wall motion and thickening with contraction. Left Ventricular Ejection Fraction: 70 % End diastolic volume 84 ml End systolic volume 25 ml IMPRESSION: 1. Two reversible defects involving the lateral wall consistent with areas of ischemia. 2. Lateral wall motion abnormality. 3. Left ventricular ejection fraction 70% 4. High-risk stress test findings*. *  2012 Appropriate Use Criteria for Coronary Revascularization Focused Update: J Am Coll Cardiol. 2012;59(9):857-881. http://content.[email or web address].aspx?articleid=[email or web address] These results will be called to the ordering clinician or representative by the Radiologist Assistant, and communication documented in the PACS or zVision Dashboard. Electronically Signed   By: Rudie Meyer M.D.   On: 02/20/2015 15:36    Anti-infectives: Anti-infectives    Start     Dose/Rate Route Frequency Ordered Stop   02/20/15  0900  ciprofloxacin (CIPRO) IVPB 400 mg     400 mg 200 mL/hr over 60 Minutes Intravenous Every 12 hours 02/20/15 0848     02/17/15 2000  ciprofloxacin (CIPRO) IVPB 200 mg  Status:  Discontinued     200 mg 100 mL/hr over 60 Minutes Intravenous Every 12 hours 02/17/15 1911 02/20/15 0848   02/17/15 1915  metroNIDAZOLE (FLAGYL) IVPB 500 mg     500 mg 100 mL/hr over 60 Minutes Intravenous Every 8 hours 02/17/15 1911         Assessment/Plan   Postoperative bile leak at cystic duct remnant/stump. s/p open chole Dr. Lovell Sheehan, s/p JP drain removal in office -There is not enough fluid in the abdomen for interventional radiology to place a drain. -Drainage is emptying out through skin where previous JP was in large amounts -saw D/w GI this morning in light of myoview results.  Agree given output is going down and pain is less, trying to slowly advance her diet and see how she does is appropriate.  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   LOS: 4 days    Sheila [name] E 02/21/2015, 11:01 AM Pager: [phone]

## 2015-02-21 NOTE — Progress Notes (Addendum)
Sheila [name] 9:32 AM  Subjective: Patient doing well without any complaints and her history was reviewed with her and her husband and I drew them a picture of the bile ducts and the question of where the leak was possibly coming from and we discussed her imaging and her heart and I discussed her case with the cardiology team and she says for 3 days she has not had much drainage and really no pain and actually wants to eat and has no new complaints  Objective: Vital signs stable afebrile no acute distress abdomen is soft nontender minimal liquid in her bag she says it is not been changed in 3 days labs okay  Assessment: Bile leak seemingly stable for 3 days  Plan: I offered the patient repeat ERCP versus slowly advancing her diet and seeing how she does and after our prolonged conversation with her and her husband we elected to slowly advance her diet and continue close follow-up and I could even see her back as an outpatient to decide if repeat ERCP is going to be needed and if cath is needed I am still happy to proceed with ERCP if needed on blood thinners since sphincterotomy has aleady been done  [facility],Sheila [name]  Pager [phone] After 5PM or if no answer call [phone]

## 2015-02-21 NOTE — Progress Notes (Signed)
Removed 75 cc of dark green bile from pt's drainage bag. This is also documented in "other output." Per pt this bag has not been emptied since it was applied on 10/30.   Sheila MewJasmine Domique Clapper, RN

## 2015-02-21 NOTE — Progress Notes (Signed)
SUBJECTIVE:  No complaints  OBJECTIVE:   Vitals:   Filed Vitals:   02/20/15 1149 02/20/15 1837 02/20/15 2141 02/21/15 0340  BP: 149/71 146/57 151/60 153/73  Pulse:  94 86 72  Temp:  98.8 F (37.1 C) 97.8 F (36.6 C) 98.6 F (37 C)  TempSrc:  Oral Oral Oral  Resp:  18 19 20   Height:      Weight:      SpO2:  96% 97% 100%   I&O's:   Intake/Output Summary (Last 24 hours) at 02/21/15 [id number] Last data filed at 02/21/15 0600  Gross per 24 hour  Intake   3800 ml  Output      0 ml  Net   3800 ml   TELEMETRY: Reviewed telemetry pt in NSR:     PHYSICAL EXAM General: Well developed, well nourished, in no acute distress Head: Eyes PERRLA, No xanthomas.   Normal cephalic and atramatic  Lungs:   Clear bilaterally to auscultation and percussion. Heart:   HRRR S1 S2 Pulses are 2+ & equal. Abdomen: Bowel sounds are positive, abdomen soft and non-tender without masses  Extremities:   No clubbing, cyanosis or edema.  DP +1 Neuro: Alert and oriented X 3. Psych:  Good affect, responds appropriately   LABS: Basic Metabolic Panel:  Recent Labs  11/91/4710/31/16 0505 02/20/15 1558 02/21/15 0555  NA 141  --  134*  K 2.9* 3.4* 3.0*  CL 106  --  100*  CO2 24  --  26  GLUCOSE 117*  --  115*  BUN <5*  --  <5*  CREATININE 0.59  --  0.59  CALCIUM 9.1  --  8.3*  MG  --  1.6*  --    Liver Function Tests:  Recent Labs  02/20/15 0505 02/21/15 0555  AST 30 20  ALT 36 27  ALKPHOS 103 90  BILITOT 0.6 0.6  PROT 6.3* 6.0*  ALBUMIN 2.8* 2.7*    Recent Labs  02/18/15 0825 02/19/15 0832  LIPASE 69* 72*   CBC:  Recent Labs  02/19/15 0832 02/20/15 0505  WBC 10.1 9.1  HGB 10.2* 9.5*  HCT 32.1* 29.1*  MCV 90.2 89.8  PLT 433* 376   Cardiac Enzymes: No results for input(s): CKTOTAL, CKMB, CKMBINDEX, TROPONINI in the last 72 hours. BNP: Invalid input(s): POCBNP D-Dimer: No results for input(s): DDIMER in the last 72 hours. Hemoglobin A1C: No results for input(s): HGBA1C in  the last 72 hours. Fasting Lipid Panel: No results for input(s): CHOL, HDL, LDLCALC, TRIG, CHOLHDL, LDLDIRECT in the last 72 hours. Thyroid Function Tests: No results for input(s): TSH, T4TOTAL, T3FREE, THYROIDAB in the last 72 hours.  Invalid input(s): FREET3 Anemia Panel: No results for input(s): VITAMINB12, FOLATE, FERRITIN, TIBC, IRON, RETICCTPCT in the last 72 hours. Coag Panel:   No results found for: INR, PROTIME  RADIOLOGY: Ct Abdomen Pelvis Wo Contrast  02/17/2015  CLINICAL DATA:  Recent cholecystectomy for gangrenous cholecystitis. Right upper quadrant abdominal pain. EXAM: CT ABDOMEN AND PELVIS WITHOUT CONTRAST TECHNIQUE: Multidetector CT imaging of the abdomen and pelvis was performed following the standard protocol without IV contrast. COMPARISON:  01/25/2015 FINDINGS: Lower chest: Patchy parenchymal densities in the left lower lobe are concerning for a small focus of aspiration or pneumonia. Evidence for coronary artery calcifications. Hepatobiliary: There is a nonmetallic biliary stent with the distal aspect in the duodenum. Cholecystectomy clips are noted. Vague low-density in the gallbladder fossa is suggestive for fluid. This fluid tracks anterior towards the abdominal wall.  Tiny focus of gas near the gallbladder fossa. Three small calcific foci between the liver and right kidney upper pole are concerning for retained gallstones. These small calcifications are grouped together and roughly measure up to 1 cm in size. Inflammatory changes along the right upper anterior abdominal wall related to recent surgery. Limited evaluation of the liver and gallbladder fossa without intravenous contrast. Pancreas: Normal appearance of the pancreas without duct dilatation or inflammation. Spleen: No acute abnormality to the spleen. Adrenals/Urinary Tract: Normal appearance of the adrenal glands. Normal appearance of both kidneys without stones or hydronephrosis. Normal appearance of the urinary  bladder. Stomach/Bowel: Moderate stool burden throughout the colon without acute colonic inflammation. Normal appearance of small bowel without obstruction. There may be mild inflammation of the proximal duodenum adjacent to the liver. Vascular/Lymphatic: Atherosclerotic calcifications in the aorta and iliac arteries without aneurysm. No significant abdominal or pelvic lymphadenopathy. Reproductive: Uterus is surgically absent. Other: No significant free fluid. Musculoskeletal: No suspicious bone lesions. IMPRESSION: Post cholecystectomy. There is a small amount of fluid and gas in the gallbladder fossa with expected inflammatory changes from recent surgery. Nonmetallic biliary stent. Small calcifications between the liver and right kidney are suggestive for retained gallstones. Patchy densities in the left lower lobe are concerning for a small focus of pneumonia or aspiration pneumonitis. Electronically Signed   By: Richarda Overlie M.D.   On: 02/17/2015 17:26   Dg Chest 2 View  02/17/2015  CLINICAL DATA:  Fever, chest pain, shortness of breath. EXAM: CHEST  2 VIEW COMPARISON:  January 25, 2015. FINDINGS: The heart size and mediastinal contours are within normal limits. Both lungs are clear. No pneumothorax or pleural effusion is noted. The visualized skeletal structures are unremarkable. IMPRESSION: No active cardiopulmonary disease. Electronically Signed   By: Lupita Raider, M.D.   On: 02/17/2015 15:50   X-ray Chest Pa And Lateral  01/25/2015  CLINICAL DATA:  Pt states she has been having stomach pain onset for 4 days. Pt states she has been vomiting and feeling nauseous. EXAM: CHEST  2 VIEW COMPARISON:  03/20/2014 FINDINGS: Cardiac silhouette is borderline enlarged. No mediastinal or hilar masses or pathologically enlarged lymph nodes. There is a right coronary artery stent. Clear lungs.  No pleural effusion or pneumothorax. Bony thorax is intact. IMPRESSION: No acute cardiopulmonary disease. Electronically  Signed   By: Amie Portland M.D.   On: 01/25/2015 12:40   Nm Hepatobiliary Including Gb  02/18/2015  CLINICAL DATA:  Status post cholecystectomy, evaluate for bile leak EXAM: NUCLEAR MEDICINE HEPATOBILIARY IMAGING TECHNIQUE: Sequential images of the abdomen were obtained out to 60 minutes following intravenous administration of radiopharmaceutical. RADIOPHARMACEUTICALS:  5.3 mCi Tc-49m  Choletec IV COMPARISON:  CT abdomen pelvis dated 02/17/2015 FINDINGS: Normal hepatic excretion. Within 15 minutes, radiotracer begins to pool along the inferior liver margin. This is clearly extraluminal and reflects a bile leak. Study was aborted after 45 minutes. IMPRESSION: Study is positive for bile leak. These results will be called to the ordering clinician or representative by the Radiologist Assistant, and communication documented in the PACS or zVision Dashboard. Electronically Signed   By: Charline Bills M.D.   On: 02/18/2015 14:06   Ct Abdomen Pelvis W Contrast  01/25/2015  CLINICAL DATA:  Diffuse abdominal pain.  Fever and diarrhea EXAM: CT ABDOMEN AND PELVIS WITH CONTRAST TECHNIQUE: Multidetector CT imaging of the abdomen and pelvis was performed using the standard protocol following bolus administration of intravenous contrast. CONTRAST:  50mL OMNIPAQUE IOHEXOL 300 MG/ML  SOLN, OMNIPAQUE IOHEXOL 300 MG/ML SOLN COMPARISON:  None. FINDINGS: Lower chest: Minimal bibasilar atelectasis. No infiltrate or effusion in the lung bases. Heart size upper normal. Hepatobiliary: Gallbladder is distended with gallbladder wall thickening. Mild stranding in the pericholecystic fat. There is a small gas bubble in the fundus of the gallbladder. Multiple calcified gallstones are present. There is gas in the cystic duct and common bile duct. The common bile duct is nondilated. Anterior to the cystic duct in the porta hepatis there is a multilocular cyst measuring 33 mm in diameter. No additional liver lesions identified. No  free air no free fluid. Pancreas: Normal pancreas. Negative for pancreatitis or pancreatic mass. Spleen: Negative Adrenals/Urinary Tract: Negative for renal obstruction. No renal mass or renal calculi. Urinary bladder normal. Stomach/Bowel: Negative for bowel obstruction. Small hiatal hernia. Negative for bowel edema. Appendix not visualized. Vascular/Lymphatic: Atherosclerotic calcification in the aorta and iliac arteries. Negative for aortic aneurysm. Reproductive: Hysterectomy.  Negative for pelvic mass. Other: No free fluid.  No free air.  Negative for adenopathy. Musculoskeletal: Minimal lumbar degenerative change. No acute skeletal abnormality. IMPRESSION: Cholelithiasis. Distended gallbladder with gallbladder wall thickening and mild pericholecystic edema. Findings suggest acute cholecystitis. In addition, there is gas in the gallbladder lumen as well as in the cystic duct and common bile duct compatible with cholecystitis. 33 mm multilocular cyst in the porta hepatis. This may represent a biliary cystadenoma or septated hepatic cyst. Appendix not visualized. Electronically Signed   By: Marlan Palau M.D.   On: 01/25/2015 07:17   Mr 3d Recon At Scanner  02/18/2015  CLINICAL DATA:  Status post laparoscopic cholecystectomy on 01/27/2015, bile leak via surgical wound, pain/tenderness EXAM: MRI ABDOMEN WITHOUT AND WITH CONTRAST (INCLUDING MRCP) TECHNIQUE: Multiplanar multisequence MR imaging of the abdomen was performed both before and after the administration of intravenous contrast. Heavily T2-weighted images of the biliary and pancreatic ducts were obtained, and three-dimensional MRCP images were rendered by post processing. CONTRAST:  12mL MULTIHANCE GADOBENATE DIMEGLUMINE 529 MG/ML IV SOLN COMPARISON:  Nuclear medicine hepatobiliary scan dated 02/18/2015. CT abdomen pelvis dated 02/09/2015. ERCP dated 02/08/2015. FINDINGS: Lower chest: Mild patchy opacity at the left lung base (series 7/image 6).  Cardiomegaly. Hepatobiliary: Liver is notable for mild hepatic steatosis. No suspicious/enhancing hepatic lesions. Mild increased perfusion along the gallbladder fossa. Status post cholecystectomy. No intrahepatic or extrahepatic ductal dilatation. Common duct measures 5 mm (series 6/image 24). No choledocholithiasis is seen. 1.6 x 2.6 cm complex fluid collection with layering fluid-fluid level in the gallbladder fossa (series 8/image 21), likely reflecting a biloma. This collection is adjacent to susceptibility artifact from a surgical clips (series 8/image 20), likely corresponding to the known bile leak at the cystic duct remnant/stump. Direct drainage of the collection to the skin surface via a catheter tract along the right anterior abdomen and exiting the right lateral abdominal wall (series 8/images 24, 27, and 35). Pancreas: Within normal limits. Spleen: Within normal limits. Adrenals/Urinary Tract: Adrenal glands are within normal limits. Kidneys within normal limits.  No hydronephrosis. Stomach/Bowel: Stomach is within normal limits. Visualized bowel is unremarkable. Vascular/Lymphatic: No evidence of abdominal aortic aneurysm. No suspicious abdominal lymphadenopathy. Other: No abdominal ascites. Musculoskeletal: No focal osseous lesions. IMPRESSION: Status post cholecystectomy. No intrahepatic or extrahepatic ductal dilatation. Common duct measures 5 mm. No choledocholithiasis is seen. 1.6 x 2.6 cm complex fluid collection/biloma in the gallbladder fossa, likely corresponding to the known bile leak at the cystic duct remnant/stump. Drug drainage of the collection to the skin  surface deviated a catheter tract along the right anterior abdomen and exiting the right lateral abdominal wall. Additional ancillary findings as above. Electronically Signed   By: Charline Bills M.D.   On: 02/18/2015 19:43   Nm Myocar Multi W/spect W/wall Motion / Ef  02/20/2015  CLINICAL DATA:  Chest pain.  Coronary artery  disease. EXAM: MYOCARDIAL IMAGING WITH SPECT (REST AND PHARMACOLOGIC-STRESS) GATED LEFT VENTRICULAR WALL MOTION STUDY LEFT VENTRICULAR EJECTION FRACTION TECHNIQUE: Standard myocardial SPECT imaging was performed after resting intravenous injection of 10 mCi Tc-30m sestamibi. Subsequently, intravenous infusion of Lexiscan was performed under the supervision of the Cardiology staff. At peak effect of the drug, 30 mCi Tc-49m sestamibi was injected intravenously and standard myocardial SPECT imaging was performed. Quantitative gated imaging was also performed to evaluate left ventricular wall motion, and estimate left ventricular ejection fraction. COMPARISON:  03/21/2014 FINDINGS: Perfusion: 2 reversible defects involving the lateral wall. The smaller defect is located along the anterior aspect of the lateral wall near the apex and the other slightly larger defect is located in the midportion of the lateral wall posteriorly. Wall Motion: Lateral wall motion abnormalities. The remaining walls of the left ventricle demonstrate normal wall motion and thickening with contraction. Left Ventricular Ejection Fraction: 70 % End diastolic volume 84 ml End systolic volume 25 ml IMPRESSION: 1. Two reversible defects involving the lateral wall consistent with areas of ischemia. 2. Lateral wall motion abnormality. 3. Left ventricular ejection fraction 70% 4. High-risk stress test findings*. *2012 Appropriate Use Criteria for Coronary Revascularization Focused Update: J Am Coll Cardiol. 2012;59(9):857-881. http://content.dementiazones.com.aspx?articleid=[email or web address] These results will be called to the ordering clinician or representative by the Radiologist Assistant, and communication documented in the PACS or zVision Dashboard. Electronically Signed   By: Rudie Meyer M.D.   On: 02/20/2015 15:36   Dg Ercp Biliary & Pancreatic Ducts  02/08/2015  CLINICAL DATA:  Sphincterotomy.  Biliary stent placement. EXAM: ERCP TECHNIQUE:  Multiple spot images obtained with the fluoroscopic device and submitted for interpretation post-procedure. COMPARISON:  CT abdomen and pelvis- 01/25/2015; abdominal ultrasound - 01/25/2015 FINDINGS: Three spot intraoperative images during ERCP are provided for review. Initial image demonstrates an ERCP probe overlying the right upper abdominal quadrant. Surgical clips overlie the expected location of the gallbladder fossa. A surgical drain also overlies the expected location of the gallbladder fossa. Subsequent images demonstrate selective cannulation opacification of the CBD. There is apparent extravasation of injected contrast adjacent to the cholecystectomy clips as well as the cranial end of the surgically placed drain. There is no definitive opacification of the cystic duct though evaluation is degraded secondary to patient respiratory artifact. Completion image demonstrates placement of a internal plastic biliary stent overlying expected location of the CBD. IMPRESSION: 1. ERCP with biliary stent placement as above. 2. Contrast injection demonstrates extravasation of contrast about the cholecystectomy clips as well as the cranial aspect of the surgically placed drain indicative of a biliary leak. The cystic duct was not definitely identified though evaluation is degraded secondary to patient respiration. Correlation with the operative report is recommended. These images were submitted for radiologic interpretation only. Please see the procedural report for the amount of contrast and the fluoroscopy time utilized. Electronically Signed   By: Simonne Come M.D.   On: 02/08/2015 14:56   Mr Abd W/wo Cm/mrcp  02/18/2015  CLINICAL DATA:  Status post laparoscopic cholecystectomy on 01/27/2015, bile leak via surgical wound, pain/tenderness EXAM: MRI ABDOMEN WITHOUT AND WITH CONTRAST (INCLUDING MRCP) TECHNIQUE: Multiplanar multisequence MR imaging  of the abdomen was performed both before and after the administration  of intravenous contrast. Heavily T2-weighted images of the biliary and pancreatic ducts were obtained, and three-dimensional MRCP images were rendered by post processing. CONTRAST:  12mL MULTIHANCE GADOBENATE DIMEGLUMINE 529 MG/ML IV SOLN COMPARISON:  Nuclear medicine hepatobiliary scan dated 02/18/2015. CT abdomen pelvis dated 02/09/2015. ERCP dated 02/08/2015. FINDINGS: Lower chest: Mild patchy opacity at the left lung base (series 7/image 6). Cardiomegaly. Hepatobiliary: Liver is notable for mild hepatic steatosis. No suspicious/enhancing hepatic lesions. Mild increased perfusion along the gallbladder fossa. Status post cholecystectomy. No intrahepatic or extrahepatic ductal dilatation. Common duct measures 5 mm (series 6/image 24). No choledocholithiasis is seen. 1.6 x 2.6 cm complex fluid collection with layering fluid-fluid level in the gallbladder fossa (series 8/image 21), likely reflecting a biloma. This collection is adjacent to susceptibility artifact from a surgical clips (series 8/image 20), likely corresponding to the known bile leak at the cystic duct remnant/stump. Direct drainage of the collection to the skin surface via a catheter tract along the right anterior abdomen and exiting the right lateral abdominal wall (series 8/images 24, 27, and 35). Pancreas: Within normal limits. Spleen: Within normal limits. Adrenals/Urinary Tract: Adrenal glands are within normal limits. Kidneys within normal limits.  No hydronephrosis. Stomach/Bowel: Stomach is within normal limits. Visualized bowel is unremarkable. Vascular/Lymphatic: No evidence of abdominal aortic aneurysm. No suspicious abdominal lymphadenopathy. Other: No abdominal ascites. Musculoskeletal: No focal osseous lesions. IMPRESSION: Status post cholecystectomy. No intrahepatic or extrahepatic ductal dilatation. Common duct measures 5 mm. No choledocholithiasis is seen. 1.6 x 2.6 cm complex fluid collection/biloma in the gallbladder fossa, likely  corresponding to the known bile leak at the cystic duct remnant/stump. Drug drainage of the collection to the skin surface deviated a catheter tract along the right anterior abdomen and exiting the right lateral abdominal wall. Additional ancillary findings as above. Electronically Signed   By: Charline Bills M.D.   On: 02/18/2015 19:43   US Abdomen Limited Ruq  01/25/2015  CLINICAL DATA:  59 year old with acute onset of upper abdominal pain associated with nausea, vomiting and diarrhea which began 3 days ago. EXAM: US ABDOMEN LIMITED - RIGHT UPPER QUADRANT COMPARISON:  CT abdomen and pelvis performed earlier same date. FINDINGS: Gallbladder: Numerous shadowing gallstones and echogenic sludge. Gallbladder wall thickening, likely overestimated on ultrasound due to the gas within the gallbladder lumen as noted on the earlier CT. Positive sonographic Murphy sign according to the ultrasound technologist. Common bile duct: Diameter: Approximately 5 mm diameter. Gas within the common bile duct as noted on the CT. Liver: Multilocular fluid collection centrally in the liver as noted on CT measuring approximately 3.3 x 2.1 cm. No other focal hepatic parenchymal abnormality. Patent portal vein with antegrade flow. IMPRESSION: 1. Cholelithiasis and acute cholecystitis. Gas within the gallbladder fundus and the nondistended common bile duct as noted on earlier CT, likely due to a gas producing organism. Less likely, the gas could arise due to a fistula with adjacent bowel. 2. Multilocular fluid collection centrally in the liver as noted on earlier CT, likely a liver abscess, given its proximity to the infected gallbladder. Electronically Signed   By: Hulan Saas M.D.   On: 01/25/2015 07:59   ASSESSMENT/PLAN: 1. Abnormal EKG - EKGs reviewed as far back as 02/2014 which show a similar appearance to EKG done on admission. These show NSR with LVH and repolarization abnormality.  2. 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 This could be due to underlying gastrointestinal issues but also could represent underlying coronary ischemia in the setting of acute stress from underlying illness. Troponin is negative. She did well without any cardiac complications during recent cholecystectomy 01/27/2015. Nuc med stress test in setting of similar appearing EKG 02/2014 showed no ischemia and normal LVF. Lexican myoview yesterday showed 2 reversible defects involving the lateral wall. The smaller defect is located along the anterior aspect of the lateralwall near the apex and the other slightly larger defect is located in the midportion of the lateral wall posteriorly.  I have discussed case with interventional colleagues.  Difficult situation in that if she has obstructive CAD requiring PCI, she would need to be on DAPT therapy for at least a month post cath prior to being able to hold antiplatelet therapy for any procedures. ERCP is low risk procedure and patient would only be under anesthesia. Would recommend adding long acting nitrates and proceeding with ERCP. Will start on Imdur 30mg  daily.   If patient ends up needing further abdominal surgery then would need cath prior to surgery to assess further cardiac risk.   3. Gangrenous GB s/p cholecystectomy now with biliary leak.  4. Abdominal pain, nausea and vomiting secondary to #3 5. Acute renal failure secondary to dehydration and volume depletion.  6. HTN - improved on BB/clonidine/amlodipine 7. Hypokalemia - replete per hospitalist    Quintella Reichert, MD  02/21/2015  8:22 AM

## 2015-02-21 NOTE — Progress Notes (Signed)
[facility]                                                                              Patient Demographics  Sheila [name], is a 59 y.o. female, DOB - 01-07-1956, NWG:[id number]  Admit date - 02/17/2015   Admitting Physician Starleen Armsawood S Elgergawy, MD  Outpatient Primary MD for the patient is Geraldo PitterBLAND,VEITA J, MD  LOS - 4   Chief Complaint  Patient presents with  . Abdominal Pain       Brief HPI  Per Dr.Elgergawy on 02/17/15 Sheila [name] is a 59 y.o. female, past medical history of coronary artery disease, hypertension, lupus and hyperlipidemia, patient with recent hospitalization at [facility], underwent open cholecystectomy on 01/27/2015, she was found to have gangrenous gallbladder and empyema, had JP drain placed in subhepatic space is due to the necrotic nature of the cystic duct, patient continues to have significant output, underwent ERCP on 02/07/2015 with biliary stent placement, patient was seen yesterday by surgery, had JP drain discontinued, patient complains of abdominal pain, nausea vomiting, report this has been constant during the entire 3 weeks, has worsened over the last 2 weeks, as was sent to ED by her PCP today, workup in ED was significant for worsening LFTs, and acute renal failure with creatinine of 2.2, mild leukocytosis of 11.7, had CT abdomen pelvis done which did show fluid collection in gallbladder fossa tracking anteriorly. Hospitalist service requested for admission   Assessment & Plan    Principal problem Abdominal pain, nausea and vomiting secondary to continuous biliary leak.: LFTs are trending down, lipase is trending down  -likely due to continuous biliary leak, patient status post biliary stent placement, her JP drain was discontinued on 10/27. Imagings noted by interventional radiology, noted did not large enough fluid collection to drain.  - HIDA scan + for biliary leak and MRCP with 1.6 x 2.6 cm complex fluid  collection/bilioma in the gallbladder fossa  - ERCP was canceled due to abnormal EKG, ST depressions/repolarization abnormalities. Stress test positive however her cardiology, ERCP is a low risk procedure and GI can proceed - Fistula pouch placed to the biliary leak - Patient has been cleared for ERCP by cardiology however patient has not had much drainage now for the last 3 days and ERCP is now deferred. Patient started on clear liquid diet today and slowly advance as tolerated. - GI to follow outpatient to decide if repeat ERCP is going to be needed.  Active problems  Hypokalemia - Replaced oral and IV, recheck later, magnesium as well  Abnormal EKG on admission in the setting of CAD with prior stents - EKG on admission had shown ST depression in inferior leads. No chest pain or SOB. Troponin 0, repeat ekg with no ischemic changes. Prior hx of CAD, hx MI 2012, mid RCA stent, [place] nuc med stress test in 02/2014 had shown no reversible ischemia or infarction, normal EF 61%   - Stress test showed 2 reversible defects involving the lateral wall, smaller defect along the anterior aspect of the lateral wall near the apex, other slightly larger defect in the midportion of  the lateral wall posteriorly. Per cardiology, for now cleared for ERCP, difficult situation if she has obstructive CAD requiring PCI, patient will need dual antiplatelet therapy for at least a month post-cath.  Acute renal failure likely related to dehydration and volume depletion from her nausea and vomiting. -Resolved with IV fluids, avoid nephrotoxic medications.  Elevated LFTs - Most likely related to her biliary leak, improving  Elevated lipase - Mildly elevated, no evidence of pancreatitis on CT abdomen and pelvis  Hypertension - Improving, continue clonidine, beta blocker, Norvasc, Imdur started today   Code Status: Full code   Family Communication: Discussed in detail with the patient, all  imaging results, lab results explained to the patient  and husband   Disposition Plan: Not medically ready   Time Spent in minutes 25 mins   Procedures  CT abdomen   Consults   Interventional radiology   GI Surgery  DVT Prophylaxis heparin subcutaneous   Medications  Scheduled Meds: . amLODipine  5 mg Oral Daily  . ciprofloxacin  400 mg Intravenous Q12H  . cloNIDine  0.1 mg Oral BID  . docusate sodium  100 mg Oral BID  . escitalopram  10 mg Oral Daily  . heparin  5,000 Units Subcutaneous 3 times per day  . isosorbide mononitrate  30 mg Oral Daily  . metoprolol  50 mg Oral BID  . metronidazole  500 mg Intravenous Q8H  . pantoprazole  40 mg Oral Daily  . potassium chloride  40 mEq Oral Daily  . sodium chloride  3 mL Intravenous Q12H   Continuous Infusions: . sodium chloride 100 mL/hr at 02/20/15 2200  . lactated ringers     PRN Meds:.hydrALAZINE, morphine injection, ondansetron (ZOFRAN) IV, promethazine, technetium TC 60M mebrofenin, zolpidem   Antibiotics   Anti-infectives    Start     Dose/Rate Route Frequency Ordered Stop   02/20/15 0900  ciprofloxacin (CIPRO) IVPB 400 mg     400 mg 200 mL/hr over 60 Minutes Intravenous Every 12 hours 02/20/15 0848     02/17/15 2000  ciprofloxacin (CIPRO) IVPB 200 mg  Status:  Discontinued     200 mg 100 mL/hr over 60 Minutes Intravenous Every 12 hours 02/17/15 1911 02/20/15 0848   02/17/15 1915  metroNIDAZOLE (FLAGYL) IVPB 500 mg     500 mg 100 mL/hr over 60 Minutes Intravenous Every 8 hours 02/17/15 1911          Subjective:   Sheila [name] was seen and examined today. Abdominal pain improving, fistula pouch placed, minimal output. No chest pain or shortness of breath. Abdominal pain improving. No nausea or vomiting, diarrhea. Afebrile.     Objective:   Blood pressure 148/72, pulse 79, temperature 98.1 F (36.7 C), temperature source Oral, resp. rate 18, height 5\' 1"  (1.549 m), weight 60.1 kg (132 lb 7.9 oz), SpO2  100 %.  Wt Readings from Last 3 Encounters:  02/19/15 60.1 kg (132 lb 7.9 oz)  02/08/15 63.957 kg (141 lb)  02/07/15 67.132 kg (148 lb)     Intake/Output Summary (Last 24 hours) at 02/21/15 1106 Last data filed at 02/21/15 1025  Gross per 24 hour  Intake   3800 ml  Output      0 ml  Net   3800 ml    Exam  General: Alert and oriented x 3, NAD  HEENT:  PERRLA, EOMI   Neck: Supple, no JVD, no masses  CVS: S1 S2 clear, RR  Respiratory: CTAB  Abdomen: Soft, fistula  pouch to right upper quadrant area, minimal tenderness NBS  Ext: no cyanosis clubbing or edema  Neuro: no new deficits  Skin: No rashes  Psych: Normal affect and demeanor, alert and oriented x3    Data Review   Micro Results No results found for this or any previous visit (from the past 240 hour(s)).  Radiology Reports Ct Abdomen Pelvis Wo Contrast  02/17/2015  CLINICAL DATA:  Recent cholecystectomy for gangrenous cholecystitis. Right upper quadrant abdominal pain. EXAM: CT ABDOMEN AND PELVIS WITHOUT CONTRAST TECHNIQUE: Multidetector CT imaging of the abdomen and pelvis was performed following the standard protocol without IV contrast. COMPARISON:  01/25/2015 FINDINGS: Lower chest: Patchy parenchymal densities in the left lower lobe are concerning for a small focus of aspiration or pneumonia. Evidence for coronary artery calcifications. Hepatobiliary: There is a nonmetallic biliary stent with the distal aspect in the duodenum. Cholecystectomy clips are noted. Vague low-density in the gallbladder fossa is suggestive for fluid. This fluid tracks anterior towards the abdominal wall. Tiny focus of gas near the gallbladder fossa. Three small calcific foci between the liver and right kidney upper pole are concerning for retained gallstones. These small calcifications are grouped together and roughly measure up to 1 cm in size. Inflammatory changes along the right upper anterior abdominal wall related to recent surgery.  Limited evaluation of the liver and gallbladder fossa without intravenous contrast. Pancreas: Normal appearance of the pancreas without duct dilatation or inflammation. Spleen: No acute abnormality to the spleen. Adrenals/Urinary Tract: Normal appearance of the adrenal glands. Normal appearance of both kidneys without stones or hydronephrosis. Normal appearance of the urinary bladder. Stomach/Bowel: Moderate stool burden throughout the colon without acute colonic inflammation. Normal appearance of small bowel without obstruction. There may be mild inflammation of the proximal duodenum adjacent to the liver. Vascular/Lymphatic: Atherosclerotic calcifications in the aorta and iliac arteries without aneurysm. No significant abdominal or pelvic lymphadenopathy. Reproductive: Uterus is surgically absent. Other: No significant free fluid. Musculoskeletal: No suspicious bone lesions. IMPRESSION: Post cholecystectomy. There is a small amount of fluid and gas in the gallbladder fossa with expected inflammatory changes from recent surgery. Nonmetallic biliary stent. Small calcifications between the liver and right kidney are suggestive for retained gallstones. Patchy densities in the left lower lobe are concerning for a small focus of pneumonia or aspiration pneumonitis. Electronically Signed   By: Richarda Overlie M.D.   On: 02/17/2015 17:26   Dg Chest 2 View  02/17/2015  CLINICAL DATA:  Fever, chest pain, shortness of breath. EXAM: CHEST  2 VIEW COMPARISON:  January 25, 2015. FINDINGS: The heart size and mediastinal contours are within normal limits. Both lungs are clear. No pneumothorax or pleural effusion is noted. The visualized skeletal structures are unremarkable. IMPRESSION: No active cardiopulmonary disease. Electronically Signed   By: Lupita Raider, M.D.   On: 02/17/2015 15:50   X-ray Chest Pa And Lateral  01/25/2015  CLINICAL DATA:  Pt states she has been having stomach pain onset for 4 days. Pt states she has  been vomiting and feeling nauseous. EXAM: CHEST  2 VIEW COMPARISON:  03/20/2014 FINDINGS: Cardiac silhouette is borderline enlarged. No mediastinal or hilar masses or pathologically enlarged lymph nodes. There is a right coronary artery stent. Clear lungs.  No pleural effusion or pneumothorax. Bony thorax is intact. IMPRESSION: No acute cardiopulmonary disease. Electronically Signed   By: Amie Portland M.D.   On: 01/25/2015 12:40   Nm Hepatobiliary Including Gb  02/18/2015  CLINICAL DATA:  Status post cholecystectomy,  evaluate for bile leak EXAM: NUCLEAR MEDICINE HEPATOBILIARY IMAGING TECHNIQUE: Sequential images of the abdomen were obtained out to 60 minutes following intravenous administration of radiopharmaceutical. RADIOPHARMACEUTICALS:  5.3 mCi Tc-78m  Choletec IV COMPARISON:  CT abdomen pelvis dated 02/17/2015 FINDINGS: Normal hepatic excretion. Within 15 minutes, radiotracer begins to pool along the inferior liver margin. This is clearly extraluminal and reflects a bile leak. Study was aborted after 45 minutes. IMPRESSION: Study is positive for bile leak. These results will be called to the ordering clinician or representative by the Radiologist Assistant, and communication documented in the PACS or zVision Dashboard. Electronically Signed   By: Charline Bills M.D.   On: 02/18/2015 14:06   Ct Abdomen Pelvis W Contrast  01/25/2015  CLINICAL DATA:  Diffuse abdominal pain.  Fever and diarrhea EXAM: CT ABDOMEN AND PELVIS WITH CONTRAST TECHNIQUE: Multidetector CT imaging of the abdomen and pelvis was performed using the standard protocol following bolus administration of intravenous contrast. CONTRAST:  50mL OMNIPAQUE IOHEXOL 300 MG/ML SOLN, OMNIPAQUE IOHEXOL 300 MG/ML SOLN COMPARISON:  None. FINDINGS: Lower chest: Minimal bibasilar atelectasis. No infiltrate or effusion in the lung bases. Heart size upper normal. Hepatobiliary: Gallbladder is distended with gallbladder wall thickening. Mild  stranding in the pericholecystic fat. There is a small gas bubble in the fundus of the gallbladder. Multiple calcified gallstones are present. There is gas in the cystic duct and common bile duct. The common bile duct is nondilated. Anterior to the cystic duct in the porta hepatis there is a multilocular cyst measuring 33 mm in diameter. No additional liver lesions identified. No free air no free fluid. Pancreas: Normal pancreas. Negative for pancreatitis or pancreatic mass. Spleen: Negative Adrenals/Urinary Tract: Negative for renal obstruction. No renal mass or renal calculi. Urinary bladder normal. Stomach/Bowel: Negative for bowel obstruction. Small hiatal hernia. Negative for bowel edema. Appendix not visualized. Vascular/Lymphatic: Atherosclerotic calcification in the aorta and iliac arteries. Negative for aortic aneurysm. Reproductive: Hysterectomy.  Negative for pelvic mass. Other: No free fluid.  No free air.  Negative for adenopathy. Musculoskeletal: Minimal lumbar degenerative change. No acute skeletal abnormality. IMPRESSION: Cholelithiasis. Distended gallbladder with gallbladder wall thickening and mild pericholecystic edema. Findings suggest acute cholecystitis. In addition, there is gas in the gallbladder lumen as well as in the cystic duct and common bile duct compatible with cholecystitis. 33 mm multilocular cyst in the porta hepatis. This may represent a biliary cystadenoma or septated hepatic cyst. Appendix not visualized. Electronically Signed   By: Marlan Palau M.D.   On: 01/25/2015 07:17   Mr 3d Recon At Scanner  02/18/2015  CLINICAL DATA:  Status post laparoscopic cholecystectomy on 01/27/2015, bile leak via surgical wound, pain/tenderness EXAM: MRI ABDOMEN WITHOUT AND WITH CONTRAST (INCLUDING MRCP) TECHNIQUE: Multiplanar multisequence MR imaging of the abdomen was performed both before and after the administration of intravenous contrast. Heavily T2-weighted images of the biliary and  pancreatic ducts were obtained, and three-dimensional MRCP images were rendered by post processing. CONTRAST:  12mL MULTIHANCE GADOBENATE DIMEGLUMINE 529 MG/ML IV SOLN COMPARISON:  Nuclear medicine hepatobiliary scan dated 02/18/2015. CT abdomen pelvis dated 02/09/2015. ERCP dated 02/08/2015. FINDINGS: Lower chest: Mild patchy opacity at the left lung base (series 7/image 6). Cardiomegaly. Hepatobiliary: Liver is notable for mild hepatic steatosis. No suspicious/enhancing hepatic lesions. Mild increased perfusion along the gallbladder fossa. Status post cholecystectomy. No intrahepatic or extrahepatic ductal dilatation. Common duct measures 5 mm (series 6/image 24). No choledocholithiasis is seen. 1.6 x 2.6 cm complex fluid collection with layering fluid-fluid  level in the gallbladder fossa (series 8/image 21), likely reflecting a biloma. This collection is adjacent to susceptibility artifact from a surgical clips (series 8/image 20), likely corresponding to the known bile leak at the cystic duct remnant/stump. Direct drainage of the collection to the skin surface via a catheter tract along the right anterior abdomen and exiting the right lateral abdominal wall (series 8/images 24, 27, and 35). Pancreas: Within normal limits. Spleen: Within normal limits. Adrenals/Urinary Tract: Adrenal glands are within normal limits. Kidneys within normal limits.  No hydronephrosis. Stomach/Bowel: Stomach is within normal limits. Visualized bowel is unremarkable. Vascular/Lymphatic: No evidence of abdominal aortic aneurysm. No suspicious abdominal lymphadenopathy. Other: No abdominal ascites. Musculoskeletal: No focal osseous lesions. IMPRESSION: Status post cholecystectomy. No intrahepatic or extrahepatic ductal dilatation. Common duct measures 5 mm. No choledocholithiasis is seen. 1.6 x 2.6 cm complex fluid collection/biloma in the gallbladder fossa, likely corresponding to the known bile leak at the cystic duct remnant/stump.  Drug drainage of the collection to the skin surface deviated a catheter tract along the right anterior abdomen and exiting the right lateral abdominal wall. Additional ancillary findings as above. Electronically Signed   By: Charline Bills M.D.   On: 02/18/2015 19:43   Nm Myocar Multi W/spect W/wall Motion / Ef  02/20/2015  CLINICAL DATA:  Chest pain.  Coronary artery disease. EXAM: MYOCARDIAL IMAGING WITH SPECT (REST AND PHARMACOLOGIC-STRESS) GATED LEFT VENTRICULAR WALL MOTION STUDY LEFT VENTRICULAR EJECTION FRACTION TECHNIQUE: Standard myocardial SPECT imaging was performed after resting intravenous injection of 10 mCi Tc-52m sestamibi. Subsequently, intravenous infusion of Lexiscan was performed under the supervision of the Cardiology staff. At peak effect of the drug, 30 mCi Tc-57m sestamibi was injected intravenously and standard myocardial SPECT imaging was performed. Quantitative gated imaging was also performed to evaluate left ventricular wall motion, and estimate left ventricular ejection fraction. COMPARISON:  03/21/2014 FINDINGS: Perfusion: 2 reversible defects involving the lateral wall. The smaller defect is located along the anterior aspect of the lateral wall near the apex and the other slightly larger defect is located in the midportion of the lateral wall posteriorly. Wall Motion: Lateral wall motion abnormalities. The remaining walls of the left ventricle demonstrate normal wall motion and thickening with contraction. Left Ventricular Ejection Fraction: 70 % End diastolic volume 84 ml End systolic volume 25 ml IMPRESSION: 1. Two reversible defects involving the lateral wall consistent with areas of ischemia. 2. Lateral wall motion abnormality. 3. Left ventricular ejection fraction 70% 4. High-risk stress test findings*. *2012 Appropriate Use Criteria for Coronary Revascularization Focused Update: J Am Coll Cardiol. 2012;59(9):857-881.  http://content.dementiazones.com.aspx?articleid=[email or web address] These results will be called to the ordering clinician or representative by the Radiologist Assistant, and communication documented in the PACS or zVision Dashboard. Electronically Signed   By: Rudie Meyer M.D.   On: 02/20/2015 15:36   Dg Ercp Biliary & Pancreatic Ducts  02/08/2015  CLINICAL DATA:  Sphincterotomy.  Biliary stent placement. EXAM: ERCP TECHNIQUE: Multiple spot images obtained with the fluoroscopic device and submitted for interpretation post-procedure. COMPARISON:  CT abdomen and pelvis- 01/25/2015; abdominal ultrasound - 01/25/2015 FINDINGS: Three spot intraoperative images during ERCP are provided for review. Initial image demonstrates an ERCP probe overlying the right upper abdominal quadrant. Surgical clips overlie the expected location of the gallbladder fossa. A surgical drain also overlies the expected location of the gallbladder fossa. Subsequent images demonstrate selective cannulation opacification of the CBD. There is apparent extravasation of injected contrast adjacent to the cholecystectomy clips as well as the cranial end  of the surgically placed drain. There is no definitive opacification of the cystic duct though evaluation is degraded secondary to patient respiratory artifact. Completion image demonstrates placement of a internal plastic biliary stent overlying expected location of the CBD. IMPRESSION: 1. ERCP with biliary stent placement as above. 2. Contrast injection demonstrates extravasation of contrast about the cholecystectomy clips as well as the cranial aspect of the surgically placed drain indicative of a biliary leak. The cystic duct was not definitely identified though evaluation is degraded secondary to patient respiration. Correlation with the operative report is recommended. These images were submitted for radiologic interpretation only. Please see the procedural report for the amount of contrast and the  fluoroscopy time utilized. Electronically Signed   By: Simonne Come M.D.   On: 02/08/2015 14:56   Mr Abd W/wo Cm/mrcp  02/18/2015  CLINICAL DATA:  Status post laparoscopic cholecystectomy on 01/27/2015, bile leak via surgical wound, pain/tenderness EXAM: MRI ABDOMEN WITHOUT AND WITH CONTRAST (INCLUDING MRCP) TECHNIQUE: Multiplanar multisequence MR imaging of the abdomen was performed both before and after the administration of intravenous contrast. Heavily T2-weighted images of the biliary and pancreatic ducts were obtained, and three-dimensional MRCP images were rendered by post processing. CONTRAST:  12mL MULTIHANCE GADOBENATE DIMEGLUMINE 529 MG/ML IV SOLN COMPARISON:  Nuclear medicine hepatobiliary scan dated 02/18/2015. CT abdomen pelvis dated 02/09/2015. ERCP dated 02/08/2015. FINDINGS: Lower chest: Mild patchy opacity at the left lung base (series 7/image 6). Cardiomegaly. Hepatobiliary: Liver is notable for mild hepatic steatosis. No suspicious/enhancing hepatic lesions. Mild increased perfusion along the gallbladder fossa. Status post cholecystectomy. No intrahepatic or extrahepatic ductal dilatation. Common duct measures 5 mm (series 6/image 24). No choledocholithiasis is seen. 1.6 x 2.6 cm complex fluid collection with layering fluid-fluid level in the gallbladder fossa (series 8/image 21), likely reflecting a biloma. This collection is adjacent to susceptibility artifact from a surgical clips (series 8/image 20), likely corresponding to the known bile leak at the cystic duct remnant/stump. Direct drainage of the collection to the skin surface via a catheter tract along the right anterior abdomen and exiting the right lateral abdominal wall (series 8/images 24, 27, and 35). Pancreas: Within normal limits. Spleen: Within normal limits. Adrenals/Urinary Tract: Adrenal glands are within normal limits. Kidneys within normal limits.  No hydronephrosis. Stomach/Bowel: Stomach is within normal limits.  Visualized bowel is unremarkable. Vascular/Lymphatic: No evidence of abdominal aortic aneurysm. No suspicious abdominal lymphadenopathy. Other: No abdominal ascites. Musculoskeletal: No focal osseous lesions. IMPRESSION: Status post cholecystectomy. No intrahepatic or extrahepatic ductal dilatation. Common duct measures 5 mm. No choledocholithiasis is seen. 1.6 x 2.6 cm complex fluid collection/biloma in the gallbladder fossa, likely corresponding to the known bile leak at the cystic duct remnant/stump. Drug drainage of the collection to the skin surface deviated a catheter tract along the right anterior abdomen and exiting the right lateral abdominal wall. Additional ancillary findings as above. Electronically Signed   By: Charline Bills M.D.   On: 02/18/2015 19:43   US Abdomen Limited Ruq  01/25/2015  CLINICAL DATA:  59 year old with acute onset of upper abdominal pain associated with nausea, vomiting and diarrhea which began 3 days ago. EXAM: US ABDOMEN LIMITED - RIGHT UPPER QUADRANT COMPARISON:  CT abdomen and pelvis performed earlier same date. FINDINGS: Gallbladder: Numerous shadowing gallstones and echogenic sludge. Gallbladder wall thickening, likely overestimated on ultrasound due to the gas within the gallbladder lumen as noted on the earlier CT. Positive sonographic Murphy sign according to the ultrasound technologist. Common bile duct: Diameter: Approximately 5  mm diameter. Gas within the common bile duct as noted on the CT. Liver: Multilocular fluid collection centrally in the liver as noted on CT measuring approximately 3.3 x 2.1 cm. No other focal hepatic parenchymal abnormality. Patent portal vein with antegrade flow. IMPRESSION: 1. Cholelithiasis and acute cholecystitis. Gas within the gallbladder fundus and the nondistended common bile duct as noted on earlier CT, likely due to a gas producing organism. Less likely, the gas could arise due to a fistula with adjacent bowel. 2. Multilocular  fluid collection centrally in the liver as noted on earlier CT, likely a liver abscess, given its proximity to the infected gallbladder. Electronically Signed   By: Hulan Saas M.D.   On: 01/25/2015 07:59    CBC  Recent Labs Lab 02/17/15 1459 02/18/15 0825 02/19/15 0832 02/20/15 0505  WBC 11.7* 9.3 10.1 9.1  HGB 12.9 9.6* 10.2* 9.5*  HCT 38.7 29.6* 32.1* 29.1*  PLT 674* 482* 433* 376  MCV 87.8 88.9 90.2 89.8  MCH 29.3 28.8 28.7 29.3  MCHC 33.3 32.4 31.8 32.6  RDW 13.6 13.8 13.9 14.0    Chemistries   Recent Labs Lab 02/17/15 1459 02/18/15 0825 02/19/15 0832 02/20/15 0505 02/20/15 1558 02/21/15 0555  NA 136 143 144 141  --  134*  K 3.2* 3.7 3.3* 2.9* 3.4* 3.0*  CL 101 114* 110 106  --  100*  CO2 20* 17* 22 24  --  26  GLUCOSE 127* 104* 107* 117*  --  115*  BUN 45* 24* 6 <5*  --  <5*  CREATININE 2.22* 0.95 0.71 0.59  --  0.59  CALCIUM 10.3 9.1 9.3 9.1  --  8.3*  MG  --   --   --   --  1.6*  --   AST 81* 45* 29 30  --  20  ALT 80* 54 42 36  --  27  ALKPHOS 151* 106 107 103  --  90  BILITOT 0.8 0.8 0.7 0.6  --  0.6   ------------------------------------------------------------------------------------------------------------------ estimated creatinine clearance is 61.4 mL/min (by C-G formula based on Cr of 0.59). ------------------------------------------------------------------------------------------------------------------ No results for input(s): HGBA1C in the last 72 hours. ------------------------------------------------------------------------------------------------------------------ No results for input(s): CHOL, HDL, LDLCALC, TRIG, CHOLHDL, LDLDIRECT in the last 72 hours. ------------------------------------------------------------------------------------------------------------------ No results for input(s): TSH, T4TOTAL, T3FREE, THYROIDAB in the last 72 hours.  Invalid input(s):  FREET3 ------------------------------------------------------------------------------------------------------------------ No results for input(s): VITAMINB12, FOLATE, FERRITIN, TIBC, IRON, RETICCTPCT in the last 72 hours.  Coagulation profile No results for input(s): INR, PROTIME in the last 168 hours.  No results for input(s): DDIMER in the last 72 hours.  Cardiac Enzymes No results for input(s): CKMB, TROPONINI, MYOGLOBIN in the last 168 hours.  Invalid input(s): CK ------------------------------------------------------------------------------------------------------------------ Invalid input(s): POCBNP  No results for input(s): GLUCAP in the last 72 hours.   Julious Langlois M.D. [facility] 02/21/2015, 11:06 AM  Pager: [phone] Between 7am to 7pm - call Pager - [phone]  After 7pm go to [email or web address] - password [email or web address]  Call night coverage person covering after 7pm

## 2015-02-22 LAB — COMPREHENSIVE METABOLIC PANEL
ALBUMIN: 2.6 g/dL — AB (ref 3.5–5.0)
ALT: 21 U/L (ref 14–54)
ANION GAP: 7 (ref 5–15)
AST: 17 U/L (ref 15–41)
Alkaline Phosphatase: 87 U/L (ref 38–126)
BUN: <5 mg/dL — ABNORMAL LOW (ref 6–20)
CHLORIDE: 98 mmol/L — AB (ref 101–111)
CO2: 29 mmol/L (ref 22–32)
Calcium: 8.8 mg/dL — ABNORMAL LOW (ref 8.9–10.3)
Creatinine, Ser: 0.57 mg/dL (ref 0.44–1.00)
GFR calc Af Amer: >60 mL/min (ref >60)
GFR calc non Af Amer: >60 mL/min (ref >60)
GLUCOSE: 119 mg/dL — AB (ref 65–99)
Potassium: 2.5 mmol/L — CL (ref 3.5–5.1)
SODIUM: 134 mmol/L — AB (ref 135–145)
Total Bilirubin: 0.7 mg/dL (ref 0.3–1.2)
Total Protein: 5.7 g/dL — ABNORMAL LOW (ref 6.5–8.1)

## 2015-02-22 LAB — CBC WITH DIFFERENTIAL/PLATELET
Basophils Absolute: 0 10*3/uL (ref 0.0–0.1)
Basophils Relative: 0 %
Eosinophils Absolute: 0.2 10*3/uL (ref 0.0–0.7)
Eosinophils Relative: 3 %
HEMATOCRIT: 30.5 % — AB (ref 36.0–46.0)
Hemoglobin: 9.9 g/dL — ABNORMAL LOW (ref 12.0–15.0)
LYMPHS PCT: 32 %
Lymphs Abs: 2 10*3/uL (ref 0.7–4.0)
MCH: 29.5 pg (ref 26.0–34.0)
MCHC: 32.5 g/dL (ref 30.0–36.0)
MCV: 90.8 fL (ref 78.0–100.0)
MONO ABS: 0.9 10*3/uL (ref 0.1–1.0)
MONOS PCT: 15 %
NEUTROS ABS: 3 10*3/uL (ref 1.7–7.7)
Neutrophils Relative %: 50 %
Platelets: 377 10*3/uL (ref 150–400)
RBC: 3.36 MIL/uL — ABNORMAL LOW (ref 3.87–5.11)
RDW: 14.2 % (ref 11.5–15.5)
WBC: 6.1 10*3/uL (ref 4.0–10.5)

## 2015-02-22 LAB — POTASSIUM
POTASSIUM: 2.8 mmol/L — AB (ref 3.5–5.1)
Potassium: 2.7 mmol/L — CL (ref 3.5–5.1)

## 2015-02-22 LAB — MAGNESIUM: Magnesium: 1.5 mg/dL — ABNORMAL LOW (ref 1.7–2.4)

## 2015-02-22 MED ORDER — MAGNESIUM SULFATE 50 % IJ SOLN
3.0000 g | Freq: Two times a day (BID) | INTRAVENOUS | Status: AC
  Administered 2015-02-22 – 2015-02-23 (×2): 3 g via INTRAVENOUS
  Filled 2015-02-22 (×3): qty 6

## 2015-02-22 MED ORDER — ENSURE ENLIVE PO LIQD
237.0000 mL | Freq: Three times a day (TID) | ORAL | Status: DC
  Administered 2015-02-22 – 2015-02-23 (×3): 237 mL via ORAL

## 2015-02-22 MED ORDER — AMLODIPINE BESYLATE 10 MG PO TABS
10.0000 mg | ORAL_TABLET | Freq: Every day | ORAL | Status: DC
  Administered 2015-02-22 – 2015-02-23 (×2): 10 mg via ORAL
  Filled 2015-02-22 (×2): qty 1

## 2015-02-22 MED ORDER — POTASSIUM CHLORIDE 10 MEQ/100ML IV SOLN
10.0000 meq | INTRAVENOUS | Status: DC
  Filled 2015-02-22: qty 100

## 2015-02-22 MED ORDER — POTASSIUM CHLORIDE CRYS ER 20 MEQ PO TBCR
40.0000 meq | EXTENDED_RELEASE_TABLET | ORAL | Status: AC
  Administered 2015-02-22 (×2): 40 meq via ORAL
  Filled 2015-02-22 (×2): qty 2

## 2015-02-22 MED ORDER — POTASSIUM CHLORIDE 10 MEQ/100ML IV SOLN
10.0000 meq | INTRAVENOUS | Status: AC
  Administered 2015-02-22: 10 meq via INTRAVENOUS

## 2015-02-22 NOTE — Progress Notes (Signed)
Nutrition Follow-up  DOCUMENTATION CODES:   Severe malnutrition in context of acute illness/injury  INTERVENTION:  Provide Ensure Enlive po TID, each supplement provides 350 kcal and 20 grams of protein.  Encourage adequate PO intake.   NUTRITION DIAGNOSIS:   Inadequate oral intake related to poor appetite, acute illness as evidenced by meal completion < 50%; ongoing  GOAL:   Patient will meet greater than or equal to 90% of their needs; not met  MONITOR:   Diet advancement, Labs, Weight trends  REASON FOR ASSESSMENT:   Malnutrition Screening Tool    ASSESSMENT:   Pt has hx of CAD, HTN, Lupus and HLD. She is s/p open cholecystectomy on 10/7 at [facility]. She was found ot have gangrenous gall bladder.   Per MD note, IR was unable to place any drain. Patient was planned to have ERCP however due to abnormal EKG at the time of admission, ERCP was canceled. Fistula pouch was placed to the biliary leak. ERCP has been postponed.   No percent meal completion recorded, however pt reports ~50% intake. Pt reports appetite has been improving. Pt does report regularly consuming Ensure at home at least twice daily. RD to order Ensure to aid in caloric and protein needs.   Labs: Low sodium, potassium, chloride, calcium.   Diet Order:  Diet full liquid Room service appropriate?: Yes; Fluid consistency:: Thin  Skin:     Last BM:     Height:   Ht Readings from Last 1 Encounters:  02/17/15 5' 1"  (1.549 m)    Weight:   Wt Readings from Last 1 Encounters:  02/21/15 134 lb 14.7 oz (61.2 kg)    Ideal Body Weight:  47.7 kg  BMI:  Body mass index is 25.51 kg/(m^2).  Estimated Nutritional Needs:   Kcal:  [phone]  Protein:  80-90 grams  Fluid:  1.7-1.9 L/day  EDUCATION NEEDS:   Education needs no appropriate at this time  Corrin Parker, MS, RD, LDN Pager # [phone] After hours/ weekend pager # [phone]

## 2015-02-22 NOTE — Progress Notes (Signed)
[facility] Progress Note  3 Days Post-Op  Subjective: Pt has less pain and nausea.  Output trending down.  Ambulating well.  Cards following.  ERCP on hold since she is having clinical improvement and given what's going on with her heart.  She tolerated fulls well this am for breakfast.     Objective: Vital signs in last 24 hours: Temp:  [98 F (36.7 C)-98.6 F (37 C)] 98 F (36.7 C) (11/02 0508) Pulse Rate:  [77-81] 77 (11/02 0508) Resp:  [16-18] 16 (11/02 0508) BP: (131-154)/(67-90) 131/90 mmHg (11/02 0508) SpO2:  [97 %-100 %] 99 % (11/02 0508) Weight:  [61.2 kg (134 lb 14.7 oz)] 61.2 kg (134 lb 14.7 oz) (11/01 2053) Last BM Date: 02/20/15  Intake/Output from previous day: 11/01 0701 - 11/02 0700 In: 3220 [P.O.:120; I.V.:2400; IV Piggyback:700] Out: 832.5 [Urine:750] Intake/Output this shift:    PE: Gen:  Alert, NAD, pleasant Abd: Soft, NT, ND, +BS, no HSM, incisions healed, old drain site in RUQ, fistula bag placed with bilious drainage (82.16mL/24hr??)   Lab Results:   Recent Labs  02/20/15 0505 02/22/15 0520  WBC 9.1 6.1  HGB 9.5* 9.9*  HCT 29.1* 30.5*  PLT 376 377   BMET  Recent Labs  02/21/15 0555 02/22/15 0520  NA 134* 134*  K 3.0* 2.5*  CL 100* 98*  CO2 26 29  GLUCOSE 115* 119*  BUN <5* <5*  CREATININE 0.59 0.57  CALCIUM 8.3* 8.8*   PT/INR No results for input(s): LABPROT, INR in the last 72 hours. CMP     Component Value Date/Time   NA 134* 02/22/2015 0520   K 2.5* 02/22/2015 0520   CL 98* 02/22/2015 0520   CO2 29 02/22/2015 0520   GLUCOSE 119* 02/22/2015 0520   BUN <5* 02/22/2015 0520   CREATININE 0.57 02/22/2015 0520   CALCIUM 8.8* 02/22/2015 0520   PROT 5.7* 02/22/2015 0520   ALBUMIN 2.6* 02/22/2015 0520   AST 17 02/22/2015 0520   ALT 21 02/22/2015 0520   ALKPHOS 87 02/22/2015 0520   BILITOT 0.7 02/22/2015 0520   GFRNONAA >60 02/22/2015 0520   GFRAA >60 02/22/2015 0520   Lipase     Component Value Date/Time    LIPASE 72* 02/19/2015 0832       Studies/Results: Nm Myocar Multi W/spect W/wall Motion / Ef  02/20/2015  CLINICAL DATA:  Chest pain.  Coronary artery disease. EXAM: MYOCARDIAL IMAGING WITH SPECT (REST AND PHARMACOLOGIC-STRESS) GATED LEFT VENTRICULAR WALL MOTION STUDY LEFT VENTRICULAR EJECTION FRACTION TECHNIQUE: Standard myocardial SPECT imaging was performed after resting intravenous injection of 10 mCi Tc-96m sestamibi. Subsequently, intravenous infusion of Lexiscan was performed under the supervision of the Cardiology staff. At peak effect of the drug, 30 mCi Tc-51m sestamibi was injected intravenously and standard myocardial SPECT imaging was performed. Quantitative gated imaging was also performed to evaluate left ventricular wall motion, and estimate left ventricular ejection fraction. COMPARISON:  03/21/2014 FINDINGS: Perfusion: 2 reversible defects involving the lateral wall. The smaller defect is located along the anterior aspect of the lateral wall near the apex and the other slightly larger defect is located in the midportion of the lateral wall posteriorly. Wall Motion: Lateral wall motion abnormalities. The remaining walls of the left ventricle demonstrate normal wall motion and thickening with contraction. Left Ventricular Ejection Fraction: 70 % End diastolic volume 84 ml End systolic volume 25 ml IMPRESSION: 1. Two reversible defects involving the lateral wall consistent with areas of ischemia. 2. Lateral wall motion abnormality.  3. Left ventricular ejection fraction 70% 4. High-risk stress test findings*. *2012 Appropriate Use Criteria for Coronary Revascularization Focused Update: J Am Coll Cardiol. 2012;59(9):857-881. http://content.dementiazones.comonlinejacc.org/article.aspx?articleid=1201161 These results will be called to the ordering clinician or representative by the Radiologist Assistant, and communication documented in the PACS or zVision Dashboard. Electronically Signed   By: Rudie MeyerP.  Gallerani  M.D.   On: 02/20/2015 15:36    Anti-infectives: Anti-infectives    Start     Dose/Rate Route Frequency Ordered Stop   02/20/15 0900  ciprofloxacin (CIPRO) IVPB 400 mg     400 mg 200 mL/hr over 60 Minutes Intravenous Every 12 hours 02/20/15 0848     02/17/15 2000  ciprofloxacin (CIPRO) IVPB 200 mg  Status:  Discontinued     200 mg 100 mL/hr over 60 Minutes Intravenous Every 12 hours 02/17/15 1911 02/20/15 0848   02/17/15 1915  metroNIDAZOLE (FLAGYL) IVPB 500 mg     500 mg 100 mL/hr over 60 Minutes Intravenous Every 8 hours 02/17/15 1911         Assessment/Plan Postoperative bile leak at cystic duct remnant/stump. s/p open chole Dr. Lovell SheehanJenkins, s/p JP drain removal in office -There is not enough fluid in the abdomen for interventional radiology to place a drain. -Drainage is emptying out through skin where previous JP was in large amounts -GI holding off on ERCP in light of lexiscan.  Agree given output is going down and pain is less, trying to slowly advance her diet and see how she does is appropriate. No indications for surgical washout at this point -If pain/nausea returns and output increases with the advancement of diet then ERCP/perc drain should be reconsidered -Continue Cipro and Flagyl Day #6/7? -WOC following to help with fistula pouch to control drainage, protect skin, and quantitate drainage  History myocardial infarction, stent placed in mid right RCA 2012 -myoview results noted and cards note seen as well.   Systemic lupus Anxiety  Hypertension uncontrolled Peptic ulcer disease.  Status post open cholecystectomy Status post abdominal hysterectomy.    LOS: 5 days    Nonie HoyerMegan N Mabel Roll 02/22/2015, 9:42 AM Pager: [phone]

## 2015-02-22 NOTE — Progress Notes (Addendum)
[facility]                                                                              Patient Demographics  Sheila [name], is a 59 y.o. female, DOB - Jan 13, 1956, ZOX:[id number]  Admit date - 02/17/2015   Admitting Physician Starleen Arms, MD  Outpatient Primary MD for the patient is [name], MD  LOS - 5   Chief Complaint  Patient presents with  . Abdominal Pain       Brief HPI  Per Dr.Elgergawy on 02/17/15 Sheila [name] is a 59 y.o. female, past medical history of coronary artery disease, hypertension, lupus and hyperlipidemia, patient with recent hospitalization at [facility], underwent open cholecystectomy on 01/27/2015, she was found to have gangrenous gallbladder and empyema, had JP drain placed in subhepatic space is due to the necrotic nature of the cystic duct, patient continues to have significant output, underwent ERCP on 02/07/2015 with biliary stent placement. Patient was seen today before the admission by the surgery and JP drain was discontinued in the office. Patient complained of abdominal pain, nausea and vomiting and was sent to the ED by her PCP.  ED workup showed worsening LFTs, and acute renal failure with creatinine of 2.2, mild leukocytosis of 11.7, had CT abdomen pelvis done which did show fluid collection in gallbladder fossa tracking anteriorly. Hospitalist service requested for admission  GI and general surgery was consulted. IR was unable to place any drain. Patient was planned to have ERCP however due to abnormal EKG at the time of admission, ERCP was canceled. Fistula pouch was placed to the biliary leak. Cardiology was consulted for clearance. The patient underwent stress test which was positive. However at this time, patient has been improving, biliary output has decreased significantly. ERCP was then postponed by GI and patient was started on diet. She is advanced to full liquid diet today.  Assessment & Plan     Principal problem Abdominal pain, nausea and vomiting secondary to continuous biliary leak.: LFTs are trending down, lipase is trending down  -likely due to continuous biliary leak, patient status post biliary stent placement, her JP drain was discontinued on 10/27. Prior IR, not enough fluid collection to drain.  - HIDA scan + for biliary leak and MRCP with 1.6 x 2.6 cm complex fluid collection/bilioma in the gallbladder fossa  - ERCP was canceled due to abnormal EKG, ST depressions/repolarization abnormalities. Stress test positive however her cardiology, ERCP is a low risk procedure and GI can proceed - Fistula pouch placed to the biliary leak - Patient has been cleared for ERCP by cardiology however patient has not had much drainage now for the last 3 days and ERCP is now deferred. Patient was able to tolerate clear liquid diet yesterday, advance to full liquid diet today.  - GI to follow outpatient to decide if repeat ERCP is going to be needed.  Active problems  Hypokalemia, hypomagnesemia - Patient refused IV replacement of potassium. Placed on oral replacement, recheck potassium again - Also placed on IV magnesium replacement. Addendum: 3:00PM Patient continues to refuse IV and oral potassium. I talked to her and  her husband in detail at bedside that hypokalemia can cause cardiac arrthymias, cramps and muscle weakness. The pills are sitting on the table besides her. She states now that she will take the pills when she wants but not IV.   Abnormal EKG on admission in the setting of CAD with prior stents - EKG on admission had shown ST depression in inferior leads. No chest pain or SOB. Troponin 0, repeat ekg with no ischemic changes. Prior hx of CAD, hx MI 2012, mid RCA stent, (Dr Diona Browner in [place]), nuc med stress test in 02/2014 had shown no reversible ischemia or infarction, normal EF 61%   - Stress test showed 2 reversible defects involving the lateral wall, smaller defect  along the anterior aspect of the lateral wall near the apex, other slightly larger defect in the midportion of the lateral wall posteriorly.  - Per cardiology, for now cleared for ERCP, difficult situation if she has obstructive CAD requiring PCI, patient will need dual antiplatelet therapy for at least a month post-cath.  Acute renal failure likely related to dehydration and volume depletion from her nausea and vomiting. -Resolved with IV fluids, avoid nephrotoxic medications.  Elevated LFTs - Most likely related to her biliary leak, improving  Elevated lipase - Mildly elevated, no evidence of pancreatitis on CT abdomen and pelvis  Hypertension - Improving, continue clonidine, beta blocker, Norvasc, Imdur  Code Status: Full code   Family Communication: Discussed in detail with the patient, all imaging results, lab results explained to the patient  and husband   Disposition Plan: Not medically ready   Time Spent in minutes 25 mins   Procedures  CT abdomen   Consults   Interventional radiology   GI Surgery  DVT Prophylaxis heparin subcutaneous   Medications  Scheduled Meds: . amLODipine  10 mg Oral Daily  . ciprofloxacin  400 mg Intravenous Q12H  . cloNIDine  0.1 mg Oral BID  . docusate sodium  100 mg Oral BID  . escitalopram  10 mg Oral Daily  . heparin  5,000 Units Subcutaneous 3 times per day  . isosorbide mononitrate  30 mg Oral Daily  . metoprolol  50 mg Oral BID  . metronidazole  500 mg Intravenous Q8H  . pantoprazole  40 mg Oral Daily  . potassium chloride  40 mEq Oral Q4H  . sodium chloride  3 mL Intravenous Q12H   Continuous Infusions: . sodium chloride 100 mL/hr at 02/22/15 0321  . lactated ringers     PRN Meds:.hydrALAZINE, morphine injection, ondansetron (ZOFRAN) IV, promethazine, technetium TC 55M mebrofenin, zolpidem   Antibiotics   Anti-infectives    Start     Dose/Rate Route Frequency Ordered Stop   02/20/15 0900  ciprofloxacin (CIPRO) IVPB  400 mg     400 mg 200 mL/hr over 60 Minutes Intravenous Every 12 hours 02/20/15 0848     02/17/15 2000  ciprofloxacin (CIPRO) IVPB 200 mg  Status:  Discontinued     200 mg 100 mL/hr over 60 Minutes Intravenous Every 12 hours 02/17/15 1911 02/20/15 0848   02/17/15 1915  metroNIDAZOLE (FLAGYL) IVPB 500 mg     500 mg 100 mL/hr over 60 Minutes Intravenous Every 8 hours 02/17/15 1911          Subjective:   Sheila [name] was seen and examined today. Feeling better, tolerating clear liquid diet. Abdominal pain is improving. fistula pouch placed, minimal output. No chest pain or shortness of breath.  No nausea or vomiting, diarrhea. Afebrile.  Objective:   Blood pressure 154/81, pulse 85, temperature 98.9 F (37.2 C), temperature source Oral, resp. rate 18, height 5\' 1"  (1.549 m), weight 61.2 kg (134 lb 14.7 oz), SpO2 98 %.  Wt Readings from Last 3 Encounters:  02/21/15 61.2 kg (134 lb 14.7 oz)  02/08/15 63.957 kg (141 lb)  02/07/15 67.132 kg (148 lb)     Intake/Output Summary (Last 24 hours) at 02/22/15 1206 Last data filed at 02/22/15 0601  Gross per 24 hour  Intake   3220 ml  Output  832.5 ml  Net 2387.5 ml    Exam  General: Alert and oriented x 3, NAD  HEENT:  PERRLA, EOMI   Neck: Supple, no JVD, no masses  CVS: S1 S2 clear, RR  Respiratory: CTAB  Abdomen: Soft, fistula pouch to right upper quadrant area, minimal tenderness NBS  Ext: no c/c/e  Neuro: no new deficits  Skin: No rashes  Psych: Normal affect and demeanor, alert and oriented x3    Data Review   Micro Results No results found for this or any previous visit (from the past 240 hour(s)).  Radiology Reports Ct Abdomen Pelvis Wo Contrast  02/17/2015  CLINICAL DATA:  Recent cholecystectomy for gangrenous cholecystitis. Right upper quadrant abdominal pain. EXAM: CT ABDOMEN AND PELVIS WITHOUT CONTRAST TECHNIQUE: Multidetector CT imaging of the abdomen and pelvis was performed following the  standard protocol without IV contrast. COMPARISON:  01/25/2015 FINDINGS: Lower chest: Patchy parenchymal densities in the left lower lobe are concerning for a small focus of aspiration or pneumonia. Evidence for coronary artery calcifications. Hepatobiliary: There is a nonmetallic biliary stent with the distal aspect in the duodenum. Cholecystectomy clips are noted. Vague low-density in the gallbladder fossa is suggestive for fluid. This fluid tracks anterior towards the abdominal wall. Tiny focus of gas near the gallbladder fossa. Three small calcific foci between the liver and right kidney upper pole are concerning for retained gallstones. These small calcifications are grouped together and roughly measure up to 1 cm in size. Inflammatory changes along the right upper anterior abdominal wall related to recent surgery. Limited evaluation of the liver and gallbladder fossa without intravenous contrast. Pancreas: Normal appearance of the pancreas without duct dilatation or inflammation. Spleen: No acute abnormality to the spleen. Adrenals/Urinary Tract: Normal appearance of the adrenal glands. Normal appearance of both kidneys without stones or hydronephrosis. Normal appearance of the urinary bladder. Stomach/Bowel: Moderate stool burden throughout the colon without acute colonic inflammation. Normal appearance of small bowel without obstruction. There may be mild inflammation of the proximal duodenum adjacent to the liver. Vascular/Lymphatic: Atherosclerotic calcifications in the aorta and iliac arteries without aneurysm. No significant abdominal or pelvic lymphadenopathy. Reproductive: Uterus is surgically absent. Other: No significant free fluid. Musculoskeletal: No suspicious bone lesions. IMPRESSION: Post cholecystectomy. There is a small amount of fluid and gas in the gallbladder fossa with expected inflammatory changes from recent surgery. Nonmetallic biliary stent. Small calcifications between the liver and  right kidney are suggestive for retained gallstones. Patchy densities in the left lower lobe are concerning for a small focus of pneumonia or aspiration pneumonitis. Electronically Signed   By: Richarda Overlie M.D.   On: 02/17/2015 17:26   Dg Chest 2 View  02/17/2015  CLINICAL DATA:  Fever, chest pain, shortness of breath. EXAM: CHEST  2 VIEW COMPARISON:  January 25, 2015. FINDINGS: The heart size and mediastinal contours are within normal limits. Both lungs are clear. No pneumothorax or pleural effusion is noted. The visualized skeletal  structures are unremarkable. IMPRESSION: No active cardiopulmonary disease. Electronically Signed   By: Lupita Raider, M.D.   On: 02/17/2015 15:50   X-ray Chest Pa And Lateral  01/25/2015  CLINICAL DATA:  Pt states she has been having stomach pain onset for 4 days. Pt states she has been vomiting and feeling nauseous. EXAM: CHEST  2 VIEW COMPARISON:  03/20/2014 FINDINGS: Cardiac silhouette is borderline enlarged. No mediastinal or hilar masses or pathologically enlarged lymph nodes. There is a right coronary artery stent. Clear lungs.  No pleural effusion or pneumothorax. Bony thorax is intact. IMPRESSION: No acute cardiopulmonary disease. Electronically Signed   By: Amie Portland M.D.   On: 01/25/2015 12:40   Nm Hepatobiliary Including Gb  02/18/2015  CLINICAL DATA:  Status post cholecystectomy, evaluate for bile leak EXAM: NUCLEAR MEDICINE HEPATOBILIARY IMAGING TECHNIQUE: Sequential images of the abdomen were obtained out to 60 minutes following intravenous administration of radiopharmaceutical. RADIOPHARMACEUTICALS:  5.3 mCi Tc-85m  Choletec IV COMPARISON:  CT abdomen pelvis dated 02/17/2015 FINDINGS: Normal hepatic excretion. Within 15 minutes, radiotracer begins to pool along the inferior liver margin. This is clearly extraluminal and reflects a bile leak. Study was aborted after 45 minutes. IMPRESSION: Study is positive for bile leak. These results will be called to the  ordering clinician or representative by the Radiologist Assistant, and communication documented in the PACS or zVision Dashboard. Electronically Signed   By: Charline Bills M.D.   On: 02/18/2015 14:06   Ct Abdomen Pelvis W Contrast  01/25/2015  CLINICAL DATA:  Diffuse abdominal pain.  Fever and diarrhea EXAM: CT ABDOMEN AND PELVIS WITH CONTRAST TECHNIQUE: Multidetector CT imaging of the abdomen and pelvis was performed using the standard protocol following bolus administration of intravenous contrast. CONTRAST:  50mL OMNIPAQUE IOHEXOL 300 MG/ML SOLN, OMNIPAQUE IOHEXOL 300 MG/ML SOLN COMPARISON:  None. FINDINGS: Lower chest: Minimal bibasilar atelectasis. No infiltrate or effusion in the lung bases. Heart size upper normal. Hepatobiliary: Gallbladder is distended with gallbladder wall thickening. Mild stranding in the pericholecystic fat. There is a small gas bubble in the fundus of the gallbladder. Multiple calcified gallstones are present. There is gas in the cystic duct and common bile duct. The common bile duct is nondilated. Anterior to the cystic duct in the porta hepatis there is a multilocular cyst measuring 33 mm in diameter. No additional liver lesions identified. No free air no free fluid. Pancreas: Normal pancreas. Negative for pancreatitis or pancreatic mass. Spleen: Negative Adrenals/Urinary Tract: Negative for renal obstruction. No renal mass or renal calculi. Urinary bladder normal. Stomach/Bowel: Negative for bowel obstruction. Small hiatal hernia. Negative for bowel edema. Appendix not visualized. Vascular/Lymphatic: Atherosclerotic calcification in the aorta and iliac arteries. Negative for aortic aneurysm. Reproductive: Hysterectomy.  Negative for pelvic mass. Other: No free fluid.  No free air.  Negative for adenopathy. Musculoskeletal: Minimal lumbar degenerative change. No acute skeletal abnormality. IMPRESSION: Cholelithiasis. Distended gallbladder with gallbladder wall thickening  and mild pericholecystic edema. Findings suggest acute cholecystitis. In addition, there is gas in the gallbladder lumen as well as in the cystic duct and common bile duct compatible with cholecystitis. 33 mm multilocular cyst in the porta hepatis. This may represent a biliary cystadenoma or septated hepatic cyst. Appendix not visualized. Electronically Signed   By: Marlan Palau M.D.   On: 01/25/2015 07:17   Mr 3d Recon At Scanner  02/18/2015  CLINICAL DATA:  Status post laparoscopic cholecystectomy on 01/27/2015, bile leak via surgical wound, pain/tenderness EXAM: MRI ABDOMEN WITHOUT AND  WITH CONTRAST (INCLUDING MRCP) TECHNIQUE: Multiplanar multisequence MR imaging of the abdomen was performed both before and after the administration of intravenous contrast. Heavily T2-weighted images of the biliary and pancreatic ducts were obtained, and three-dimensional MRCP images were rendered by post processing. CONTRAST:  12mL MULTIHANCE GADOBENATE DIMEGLUMINE 529 MG/ML IV SOLN COMPARISON:  Nuclear medicine hepatobiliary scan dated 02/18/2015. CT abdomen pelvis dated 02/09/2015. ERCP dated 02/08/2015. FINDINGS: Lower chest: Mild patchy opacity at the left lung base (series 7/image 6). Cardiomegaly. Hepatobiliary: Liver is notable for mild hepatic steatosis. No suspicious/enhancing hepatic lesions. Mild increased perfusion along the gallbladder fossa. Status post cholecystectomy. No intrahepatic or extrahepatic ductal dilatation. Common duct measures 5 mm (series 6/image 24). No choledocholithiasis is seen. 1.6 x 2.6 cm complex fluid collection with layering fluid-fluid level in the gallbladder fossa (series 8/image 21), likely reflecting a biloma. This collection is adjacent to susceptibility artifact from a surgical clips (series 8/image 20), likely corresponding to the known bile leak at the cystic duct remnant/stump. Direct drainage of the collection to the skin surface via a catheter tract along the right anterior  abdomen and exiting the right lateral abdominal wall (series 8/images 24, 27, and 35). Pancreas: Within normal limits. Spleen: Within normal limits. Adrenals/Urinary Tract: Adrenal glands are within normal limits. Kidneys within normal limits.  No hydronephrosis. Stomach/Bowel: Stomach is within normal limits. Visualized bowel is unremarkable. Vascular/Lymphatic: No evidence of abdominal aortic aneurysm. No suspicious abdominal lymphadenopathy. Other: No abdominal ascites. Musculoskeletal: No focal osseous lesions. IMPRESSION: Status post cholecystectomy. No intrahepatic or extrahepatic ductal dilatation. Common duct measures 5 mm. No choledocholithiasis is seen. 1.6 x 2.6 cm complex fluid collection/biloma in the gallbladder fossa, likely corresponding to the known bile leak at the cystic duct remnant/stump. Drug drainage of the collection to the skin surface deviated a catheter tract along the right anterior abdomen and exiting the right lateral abdominal wall. Additional ancillary findings as above. Electronically Signed   By: Charline Bills M.D.   On: 02/18/2015 19:43   Nm Myocar Multi W/spect W/wall Motion / Ef  02/20/2015  CLINICAL DATA:  Chest pain.  Coronary artery disease. EXAM: MYOCARDIAL IMAGING WITH SPECT (REST AND PHARMACOLOGIC-STRESS) GATED LEFT VENTRICULAR WALL MOTION STUDY LEFT VENTRICULAR EJECTION FRACTION TECHNIQUE: Standard myocardial SPECT imaging was performed after resting intravenous injection of 10 mCi Tc-75m sestamibi. Subsequently, intravenous infusion of Lexiscan was performed under the supervision of the Cardiology staff. At peak effect of the drug, 30 mCi Tc-37m sestamibi was injected intravenously and standard myocardial SPECT imaging was performed. Quantitative gated imaging was also performed to evaluate left ventricular wall motion, and estimate left ventricular ejection fraction. COMPARISON:  03/21/2014 FINDINGS: Perfusion: 2 reversible defects involving the lateral wall. The  smaller defect is located along the anterior aspect of the lateral wall near the apex and the other slightly larger defect is located in the midportion of the lateral wall posteriorly. Wall Motion: Lateral wall motion abnormalities. The remaining walls of the left ventricle demonstrate normal wall motion and thickening with contraction. Left Ventricular Ejection Fraction: 70 % End diastolic volume 84 ml End systolic volume 25 ml IMPRESSION: 1. Two reversible defects involving the lateral wall consistent with areas of ischemia. 2. Lateral wall motion abnormality. 3. Left ventricular ejection fraction 70% 4. High-risk stress test findings*. *2012 Appropriate Use Criteria for Coronary Revascularization Focused Update: J Am Coll Cardiol. 2012;59(9):857-881. http://content.dementiazones.com.aspx?articleid=[email or web address] These results will be called to the ordering clinician or representative by the Radiologist Assistant, and communication documented in the PACS or  zVision Dashboard. Electronically Signed   By: Rudie Meyer M.D.   On: 02/20/2015 15:36   Dg Ercp Biliary & Pancreatic Ducts  02/08/2015  CLINICAL DATA:  Sphincterotomy.  Biliary stent placement. EXAM: ERCP TECHNIQUE: Multiple spot images obtained with the fluoroscopic device and submitted for interpretation post-procedure. COMPARISON:  CT abdomen and pelvis- 01/25/2015; abdominal ultrasound - 01/25/2015 FINDINGS: Three spot intraoperative images during ERCP are provided for review. Initial image demonstrates an ERCP probe overlying the right upper abdominal quadrant. Surgical clips overlie the expected location of the gallbladder fossa. A surgical drain also overlies the expected location of the gallbladder fossa. Subsequent images demonstrate selective cannulation opacification of the CBD. There is apparent extravasation of injected contrast adjacent to the cholecystectomy clips as well as the cranial end of the surgically placed drain. There is no  definitive opacification of the cystic duct though evaluation is degraded secondary to patient respiratory artifact. Completion image demonstrates placement of a internal plastic biliary stent overlying expected location of the CBD. IMPRESSION: 1. ERCP with biliary stent placement as above. 2. Contrast injection demonstrates extravasation of contrast about the cholecystectomy clips as well as the cranial aspect of the surgically placed drain indicative of a biliary leak. The cystic duct was not definitely identified though evaluation is degraded secondary to patient respiration. Correlation with the operative report is recommended. These images were submitted for radiologic interpretation only. Please see the procedural report for the amount of contrast and the fluoroscopy time utilized. Electronically Signed   By: Simonne Come M.D.   On: 02/08/2015 14:56   Mr Abd W/wo Cm/mrcp  02/18/2015  CLINICAL DATA:  Status post laparoscopic cholecystectomy on 01/27/2015, bile leak via surgical wound, pain/tenderness EXAM: MRI ABDOMEN WITHOUT AND WITH CONTRAST (INCLUDING MRCP) TECHNIQUE: Multiplanar multisequence MR imaging of the abdomen was performed both before and after the administration of intravenous contrast. Heavily T2-weighted images of the biliary and pancreatic ducts were obtained, and three-dimensional MRCP images were rendered by post processing. CONTRAST:  12mL MULTIHANCE GADOBENATE DIMEGLUMINE 529 MG/ML IV SOLN COMPARISON:  Nuclear medicine hepatobiliary scan dated 02/18/2015. CT abdomen pelvis dated 02/09/2015. ERCP dated 02/08/2015. FINDINGS: Lower chest: Mild patchy opacity at the left lung base (series 7/image 6). Cardiomegaly. Hepatobiliary: Liver is notable for mild hepatic steatosis. No suspicious/enhancing hepatic lesions. Mild increased perfusion along the gallbladder fossa. Status post cholecystectomy. No intrahepatic or extrahepatic ductal dilatation. Common duct measures 5 mm (series 6/image 24).  No choledocholithiasis is seen. 1.6 x 2.6 cm complex fluid collection with layering fluid-fluid level in the gallbladder fossa (series 8/image 21), likely reflecting a biloma. This collection is adjacent to susceptibility artifact from a surgical clips (series 8/image 20), likely corresponding to the known bile leak at the cystic duct remnant/stump. Direct drainage of the collection to the skin surface via a catheter tract along the right anterior abdomen and exiting the right lateral abdominal wall (series 8/images 24, 27, and 35). Pancreas: Within normal limits. Spleen: Within normal limits. Adrenals/Urinary Tract: Adrenal glands are within normal limits. Kidneys within normal limits.  No hydronephrosis. Stomach/Bowel: Stomach is within normal limits. Visualized bowel is unremarkable. Vascular/Lymphatic: No evidence of abdominal aortic aneurysm. No suspicious abdominal lymphadenopathy. Other: No abdominal ascites. Musculoskeletal: No focal osseous lesions. IMPRESSION: Status post cholecystectomy. No intrahepatic or extrahepatic ductal dilatation. Common duct measures 5 mm. No choledocholithiasis is seen. 1.6 x 2.6 cm complex fluid collection/biloma in the gallbladder fossa, likely corresponding to the known bile leak at the cystic duct remnant/stump. Drug drainage of  the collection to the skin surface deviated a catheter tract along the right anterior abdomen and exiting the right lateral abdominal wall. Additional ancillary findings as above. Electronically Signed   By: Charline BillsSriyesh  Krishnan M.D.   On: 02/18/2015 19:43   [place] Abdomen Limited Ruq  01/25/2015  CLINICAL DATA:  59 year old with acute onset of upper abdominal pain associated with nausea, vomiting and diarrhea which began 3 days ago. EXAM: US ABDOMEN LIMITED - RIGHT UPPER QUADRANT COMPARISON:  CT abdomen and pelvis performed earlier same date. FINDINGS: Gallbladder: Numerous shadowing gallstones and echogenic sludge. Gallbladder wall thickening, likely  overestimated on ultrasound due to the gas within the gallbladder lumen as noted on the earlier CT. Positive sonographic Murphy sign according to the ultrasound technologist. Common bile duct: Diameter: Approximately 5 mm diameter. Gas within the common bile duct as noted on the CT. Liver: Multilocular fluid collection centrally in the liver as noted on CT measuring approximately 3.3 x 2.1 cm. No other focal hepatic parenchymal abnormality. Patent portal vein with antegrade flow. IMPRESSION: 1. Cholelithiasis and acute cholecystitis. Gas within the gallbladder fundus and the nondistended common bile duct as noted on earlier CT, likely due to a gas producing organism. Less likely, the gas could arise due to a fistula with adjacent bowel. 2. Multilocular fluid collection centrally in the liver as noted on earlier CT, likely a liver abscess, given its proximity to the infected gallbladder. Electronically Signed   By: Hulan Saashomas  Lawrence M.D.   On: 01/25/2015 07:59    CBC  Recent Labs Lab 02/17/15 1459 02/18/15 0825 02/19/15 0832 02/20/15 0505 02/22/15 0520  WBC 11.7* 9.3 10.1 9.1 6.1  HGB 12.9 9.6* 10.2* 9.5* 9.9*  HCT 38.7 29.6* 32.1* 29.1* 30.5*  PLT 674* 482* 433* 376 377  MCV 87.8 88.9 90.2 89.8 90.8  MCH 29.3 28.8 28.7 29.3 29.5  MCHC 33.3 32.4 31.8 32.6 32.5  RDW 13.6 13.8 13.9 14.0 14.2  LYMPHSABS  --   --   --   --  2.0  MONOABS  --   --   --   --  0.9  EOSABS  --   --   --   --  0.2  BASOSABS  --   --   --   --  0.0    Chemistries   Recent Labs Lab 02/18/15 0825 02/19/15 0832 02/20/15 0505 02/20/15 1558 02/21/15 0555 02/22/15 0520  NA 143 144 141  --  134* 134*  K 3.7 3.3* 2.9* 3.4* 3.0* 2.5*  CL 114* 110 106  --  100* 98*  CO2 17* 22 24  --  26 29  GLUCOSE 104* 107* 117*  --  115* 119*  BUN 24* 6 <5*  --  <5* <5*  CREATININE 0.95 0.71 0.59  --  0.59 0.57  CALCIUM 9.1 9.3 9.1  --  8.3* 8.8*  MG  --   --   --  1.6*  --  1.5*  AST 45* 29 30  --  20 17  ALT 54 42 36  --   27 21  ALKPHOS 106 107 103  --  90 87  BILITOT 0.8 0.7 0.6  --  0.6 0.7   ------------------------------------------------------------------------------------------------------------------ estimated creatinine clearance is 62 mL/min (by C-G formula based on Cr of 0.57). ------------------------------------------------------------------------------------------------------------------ No results for input(s): HGBA1C in the last 72 hours. ------------------------------------------------------------------------------------------------------------------ No results for input(s): CHOL, HDL, LDLCALC, TRIG, CHOLHDL, LDLDIRECT in the last 72 hours. ------------------------------------------------------------------------------------------------------------------ No results for input(s): TSH, T4TOTAL, T3FREE,  THYROIDAB in the last 72 hours.  Invalid input(s): FREET3 ------------------------------------------------------------------------------------------------------------------ No results for input(s): VITAMINB12, FOLATE, FERRITIN, TIBC, IRON, RETICCTPCT in the last 72 hours.  Coagulation profile No results for input(s): INR, PROTIME in the last 168 hours.  No results for input(s): DDIMER in the last 72 hours.  Cardiac Enzymes No results for input(s): CKMB, TROPONINI, MYOGLOBIN in the last 168 hours.  Invalid input(s): CK ------------------------------------------------------------------------------------------------------------------ Invalid input(s): POCBNP  No results for input(s): GLUCAP in the last 72 hours.   Annibelle Brazie M.D. [facility] 02/22/2015, 12:06 PM  Pager: [phone] Between 7am to 7pm - call Pager - [phone]  After 7pm go to [email or web address] - password [email or web address]  Call night coverage person covering after 7pm

## 2015-02-22 NOTE — Progress Notes (Signed)
Sheila PlentyInetta Kath 8:42 AM  Subjective: Patient doing well without any new complaints and is walking a little and tolerating clear liquids still with minimal drainage  Objective: Vital signs stable afebrile no acute distress exam pertinent for some incisional pain but otherwise soft nontender labs okay  Assessment: Bile leak seemingly improving  Plan: Will slowly advance diet and consider PT consult per primary care which was discussed with the patient since she wants to get back to work and I will continue to follow and happy to proceed with ERCP at appropriate time and possibly could even be discharged soon and followed as an outpatient not only by myself but by her regular surgeon or even her local gastroenterologist and I discussed all the above with the patient and her husband  [facility], [facility],Jakita Dutkiewicz E  Pager [phone] After 5PM or if no answer call [phone]

## 2015-02-22 NOTE — Progress Notes (Signed)
CRITICAL VALUE ALERT  Critical value received:  Potassium 2.7   Date of notification:  02/22/2015  Time of notification:  1233  Critical value read back:Yes.    Nurse who received alert:  Doristine Devoidyanne Hill RN   MD notified (1st page):  Rod Can Rai MD  Time of first page:  1234  MD notified (2nd page):  Time of second page:  Responding MD:  Rod Can Rai MD  Time MD responded:  1235  Comments: MD stated she would call into patient room and speak with her about lab results.   Potassium  Status: Finalresult Visible to patient:  Not Released Nextappt: None              Ref Range 11:28 AM  5:20 AM  1d ago  2d ago     Potassium 3.5 - 5.1 mmol/L 2.7 (LL) 2.5 (LL)CM 3.0 (L) 3.4 (L)   Comments: CRITICAL RESULT CALLED TO, READ BACK BY AND VERIFIED WITH:  R.HILL,RN 1235 02/22/15 CLARK,S

## 2015-02-22 NOTE — Progress Notes (Signed)
SUBJECTIVE:  No complaints  OBJECTIVE:   Vitals:   Filed Vitals:   02/21/15 1025 02/21/15 1720 02/21/15 2053 02/22/15 0508  BP: 148/72 152/67 154/74 131/90  Pulse: 79 77 81 77  Temp: 98.1 F (36.7 C) 98.3 F (36.8 C) 98.6 F (37 C) 98 F (36.7 C)  TempSrc: Oral Oral Oral Oral  Resp: 18 18 17 16   Height:      Weight:   134 lb 14.7 oz (61.2 kg)   SpO2: 100% 100% 97% 99%   I&O's:   Intake/Output Summary (Last 24 hours) at 02/22/15 0855 Last data filed at 02/22/15 0601  Gross per 24 hour  Intake   3220 ml  Output  832.5 ml  Net 2387.5 ml   TELEMETRY: Reviewed telemetry pt in NSR:     PHYSICAL EXAM General: Well developed, well nourished, in no acute distress Head: Eyes PERRLA, No xanthomas.   Normal cephalic and atramatic  Lungs:   Clear bilaterally to auscultation and percussion. Heart:   HRRR S1 S2 Pulses are 2+ & equal. Abdomen: Bowel sounds are positive, abdomen soft and non-tender without masses  Extremities:   No clubbing, cyanosis or edema.  DP +1 Neuro: Alert and oriented X 3. Psych:  Good affect, responds appropriately   LABS: Basic Metabolic Panel:  Recent Labs  16/10/96 1558 02/21/15 0555 02/22/15 0520  NA  --  134* 134*  K 3.4* 3.0* 2.5*  CL  --  100* 98*  CO2  --  26 29  GLUCOSE  --  115* 119*  BUN  --  <5* <5*  CREATININE  --  0.59 0.57  CALCIUM  --  8.3* 8.8*  MG 1.6*  --  1.5*   Liver Function Tests:  Recent Labs  02/21/15 0555 02/22/15 0520  AST 20 17  ALT 27 21  ALKPHOS 90 87  BILITOT 0.6 0.7  PROT 6.0* 5.7*  ALBUMIN 2.7* 2.6*   No results for input(s): LIPASE, AMYLASE in the last 72 hours. CBC:  Recent Labs  02/20/15 0505 02/22/15 0520  WBC 9.1 6.1  NEUTROABS  --  3.0  HGB 9.5* 9.9*  HCT 29.1* 30.5*  MCV 89.8 90.8  PLT 376 377   Cardiac Enzymes: No results for input(s): CKTOTAL, CKMB, CKMBINDEX, TROPONINI in the last 72 hours. BNP: Invalid input(s): POCBNP D-Dimer: No results for input(s): DDIMER in the  last 72 hours. Hemoglobin A1C: No results for input(s): HGBA1C in the last 72 hours. Fasting Lipid Panel: No results for input(s): CHOL, HDL, LDLCALC, TRIG, CHOLHDL, LDLDIRECT in the last 72 hours. Thyroid Function Tests: No results for input(s): TSH, T4TOTAL, T3FREE, THYROIDAB in the last 72 hours.  Invalid input(s): FREET3 Anemia Panel: No results for input(s): VITAMINB12, FOLATE, FERRITIN, TIBC, IRON, RETICCTPCT in the last 72 hours. Coag Panel:   No results found for: INR, PROTIME  RADIOLOGY: Ct Abdomen Pelvis Wo Contrast  02/17/2015  CLINICAL DATA:  Recent cholecystectomy for gangrenous cholecystitis. Right upper quadrant abdominal pain. EXAM: CT ABDOMEN AND PELVIS WITHOUT CONTRAST TECHNIQUE: Multidetector CT imaging of the abdomen and pelvis was performed following the standard protocol without IV contrast. COMPARISON:  01/25/2015 FINDINGS: Lower chest: Patchy parenchymal densities in the left lower lobe are concerning for a small focus of aspiration or pneumonia. Evidence for coronary artery calcifications. Hepatobiliary: There is a nonmetallic biliary stent with the distal aspect in the duodenum. Cholecystectomy clips are noted. Vague low-density in the gallbladder fossa is suggestive for fluid. This fluid tracks anterior towards  the abdominal wall. Tiny focus of gas near the gallbladder fossa. Three small calcific foci between the liver and right kidney upper pole are concerning for retained gallstones. These small calcifications are grouped together and roughly measure up to 1 cm in size. Inflammatory changes along the right upper anterior abdominal wall related to recent surgery. Limited evaluation of the liver and gallbladder fossa without intravenous contrast. Pancreas: Normal appearance of the pancreas without duct dilatation or inflammation. Spleen: No acute abnormality to the spleen. Adrenals/Urinary Tract: Normal appearance of the adrenal glands. Normal appearance of both kidneys  without stones or hydronephrosis. Normal appearance of the urinary bladder. Stomach/Bowel: Moderate stool burden throughout the colon without acute colonic inflammation. Normal appearance of small bowel without obstruction. There may be mild inflammation of the proximal duodenum adjacent to the liver. Vascular/Lymphatic: Atherosclerotic calcifications in the aorta and iliac arteries without aneurysm. No significant abdominal or pelvic lymphadenopathy. Reproductive: Uterus is surgically absent. Other: No significant free fluid. Musculoskeletal: No suspicious bone lesions. IMPRESSION: Post cholecystectomy. There is a small amount of fluid and gas in the gallbladder fossa with expected inflammatory changes from recent surgery. Nonmetallic biliary stent. Small calcifications between the liver and right kidney are suggestive for retained gallstones. Patchy densities in the left lower lobe are concerning for a small focus of pneumonia or aspiration pneumonitis. Electronically Signed   By: Richarda Overlie M.D.   On: 02/17/2015 17:26   Dg Chest 2 View  02/17/2015  CLINICAL DATA:  Fever, chest pain, shortness of breath. EXAM: CHEST  2 VIEW COMPARISON:  January 25, 2015. FINDINGS: The heart size and mediastinal contours are within normal limits. Both lungs are clear. No pneumothorax or pleural effusion is noted. The visualized skeletal structures are unremarkable. IMPRESSION: No active cardiopulmonary disease. Electronically Signed   By: Lupita Raider, M.D.   On: 02/17/2015 15:50   X-ray Chest Pa And Lateral  01/25/2015  CLINICAL DATA:  Pt states she has been having stomach pain onset for 4 days. Pt states she has been vomiting and feeling nauseous. EXAM: CHEST  2 VIEW COMPARISON:  03/20/2014 FINDINGS: Cardiac silhouette is borderline enlarged. No mediastinal or hilar masses or pathologically enlarged lymph nodes. There is a right coronary artery stent. Clear lungs.  No pleural effusion or pneumothorax. Bony thorax is  intact. IMPRESSION: No acute cardiopulmonary disease. Electronically Signed   By: Amie Portland M.D.   On: 01/25/2015 12:40   Nm Hepatobiliary Including Gb  02/18/2015  CLINICAL DATA:  Status post cholecystectomy, evaluate for bile leak EXAM: NUCLEAR MEDICINE HEPATOBILIARY IMAGING TECHNIQUE: Sequential images of the abdomen were obtained out to 60 minutes following intravenous administration of radiopharmaceutical. RADIOPHARMACEUTICALS:  5.3 mCi Tc-21m  Choletec IV COMPARISON:  CT abdomen pelvis dated 02/17/2015 FINDINGS: Normal hepatic excretion. Within 15 minutes, radiotracer begins to pool along the inferior liver margin. This is clearly extraluminal and reflects a bile leak. Study was aborted after 45 minutes. IMPRESSION: Study is positive for bile leak. These results will be called to the ordering clinician or representative by the Radiologist Assistant, and communication documented in the PACS or zVision Dashboard. Electronically Signed   By: Charline Bills M.D.   On: 02/18/2015 14:06   Ct Abdomen Pelvis W Contrast  01/25/2015  CLINICAL DATA:  Diffuse abdominal pain.  Fever and diarrhea EXAM: CT ABDOMEN AND PELVIS WITH CONTRAST TECHNIQUE: Multidetector CT imaging of the abdomen and pelvis was performed using the standard protocol following bolus administration of intravenous contrast. CONTRAST:  50mL OMNIPAQUE  IOHEXOL 300 MG/ML SOLN, OMNIPAQUE IOHEXOL 300 MG/ML SOLN COMPARISON:  None. FINDINGS: Lower chest: Minimal bibasilar atelectasis. No infiltrate or effusion in the lung bases. Heart size upper normal. Hepatobiliary: Gallbladder is distended with gallbladder wall thickening. Mild stranding in the pericholecystic fat. There is a small gas bubble in the fundus of the gallbladder. Multiple calcified gallstones are present. There is gas in the cystic duct and common bile duct. The common bile duct is nondilated. Anterior to the cystic duct in the porta hepatis there is a multilocular cyst  measuring 33 mm in diameter. No additional liver lesions identified. No free air no free fluid. Pancreas: Normal pancreas. Negative for pancreatitis or pancreatic mass. Spleen: Negative Adrenals/Urinary Tract: Negative for renal obstruction. No renal mass or renal calculi. Urinary bladder normal. Stomach/Bowel: Negative for bowel obstruction. Small hiatal hernia. Negative for bowel edema. Appendix not visualized. Vascular/Lymphatic: Atherosclerotic calcification in the aorta and iliac arteries. Negative for aortic aneurysm. Reproductive: Hysterectomy.  Negative for pelvic mass. Other: No free fluid.  No free air.  Negative for adenopathy. Musculoskeletal: Minimal lumbar degenerative change. No acute skeletal abnormality. IMPRESSION: Cholelithiasis. Distended gallbladder with gallbladder wall thickening and mild pericholecystic edema. Findings suggest acute cholecystitis. In addition, there is gas in the gallbladder lumen as well as in the cystic duct and common bile duct compatible with cholecystitis. 33 mm multilocular cyst in the porta hepatis. This may represent a biliary cystadenoma or septated hepatic cyst. Appendix not visualized. Electronically Signed   By: Marlan Palau M.D.   On: 01/25/2015 07:17   Mr 3d Recon At Scanner  02/18/2015  CLINICAL DATA:  Status post laparoscopic cholecystectomy on 01/27/2015, bile leak via surgical wound, pain/tenderness EXAM: MRI ABDOMEN WITHOUT AND WITH CONTRAST (INCLUDING MRCP) TECHNIQUE: Multiplanar multisequence MR imaging of the abdomen was performed both before and after the administration of intravenous contrast. Heavily T2-weighted images of the biliary and pancreatic ducts were obtained, and three-dimensional MRCP images were rendered by post processing. CONTRAST:  12mL MULTIHANCE GADOBENATE DIMEGLUMINE 529 MG/ML IV SOLN COMPARISON:  Nuclear medicine hepatobiliary scan dated 02/18/2015. CT abdomen pelvis dated 02/09/2015. ERCP dated 02/08/2015. FINDINGS: Lower  chest: Mild patchy opacity at the left lung base (series 7/image 6). Cardiomegaly. Hepatobiliary: Liver is notable for mild hepatic steatosis. No suspicious/enhancing hepatic lesions. Mild increased perfusion along the gallbladder fossa. Status post cholecystectomy. No intrahepatic or extrahepatic ductal dilatation. Common duct measures 5 mm (series 6/image 24). No choledocholithiasis is seen. 1.6 x 2.6 cm complex fluid collection with layering fluid-fluid level in the gallbladder fossa (series 8/image 21), likely reflecting a biloma. This collection is adjacent to susceptibility artifact from a surgical clips (series 8/image 20), likely corresponding to the known bile leak at the cystic duct remnant/stump. Direct drainage of the collection to the skin surface via a catheter tract along the right anterior abdomen and exiting the right lateral abdominal wall (series 8/images 24, 27, and 35). Pancreas: Within normal limits. Spleen: Within normal limits. Adrenals/Urinary Tract: Adrenal glands are within normal limits. Kidneys within normal limits.  No hydronephrosis. Stomach/Bowel: Stomach is within normal limits. Visualized bowel is unremarkable. Vascular/Lymphatic: No evidence of abdominal aortic aneurysm. No suspicious abdominal lymphadenopathy. Other: No abdominal ascites. Musculoskeletal: No focal osseous lesions. IMPRESSION: Status post cholecystectomy. No intrahepatic or extrahepatic ductal dilatation. Common duct measures 5 mm. No choledocholithiasis is seen. 1.6 x 2.6 cm complex fluid collection/biloma in the gallbladder fossa, likely corresponding to the known bile leak at the cystic duct remnant/stump. Drug drainage of the collection  to the skin surface deviated a catheter tract along the right anterior abdomen and exiting the right lateral abdominal wall. Additional ancillary findings as above. Electronically Signed   By: Charline BillsSriyesh  Krishnan M.D.   On: 02/18/2015 19:43   Nm Myocar Multi W/spect W/wall  Motion / Ef  02/20/2015  CLINICAL DATA:  Chest pain.  Coronary artery disease. EXAM: MYOCARDIAL IMAGING WITH SPECT (REST AND PHARMACOLOGIC-STRESS) GATED LEFT VENTRICULAR WALL MOTION STUDY LEFT VENTRICULAR EJECTION FRACTION TECHNIQUE: Standard myocardial SPECT imaging was performed after resting intravenous injection of 10 mCi Tc-3719m sestamibi. Subsequently, intravenous infusion of Lexiscan was performed under the supervision of the Cardiology staff. At peak effect of the drug, 30 mCi Tc-3819m sestamibi was injected intravenously and standard myocardial SPECT imaging was performed. Quantitative gated imaging was also performed to evaluate left ventricular wall motion, and estimate left ventricular ejection fraction. COMPARISON:  03/21/2014 FINDINGS: Perfusion: 2 reversible defects involving the lateral wall. The smaller defect is located along the anterior aspect of the lateral wall near the apex and the other slightly larger defect is located in the midportion of the lateral wall posteriorly. Wall Motion: Lateral wall motion abnormalities. The remaining walls of the left ventricle demonstrate normal wall motion and thickening with contraction. Left Ventricular Ejection Fraction: 70 % End diastolic volume 84 ml End systolic volume 25 ml IMPRESSION: 1. Two reversible defects involving the lateral wall consistent with areas of ischemia. 2. Lateral wall motion abnormality. 3. Left ventricular ejection fraction 70% 4. High-risk stress test findings*. *2012 Appropriate Use Criteria for Coronary Revascularization Focused Update: J Am Coll Cardiol. 2012;59(9):857-881. http://content.dementiazones.comonlinejacc.org/article.aspx?articleid=1201161 These results will be called to the ordering clinician or representative by the Radiologist Assistant, and communication documented in the PACS or zVision Dashboard. Electronically Signed   By: Rudie MeyerP.  Gallerani M.D.   On: 02/20/2015 15:36   Dg Ercp Biliary & Pancreatic Ducts  02/08/2015  CLINICAL  DATA:  Sphincterotomy.  Biliary stent placement. EXAM: ERCP TECHNIQUE: Multiple spot images obtained with the fluoroscopic device and submitted for interpretation post-procedure. COMPARISON:  CT abdomen and pelvis- 01/25/2015; abdominal ultrasound - 01/25/2015 FINDINGS: Three spot intraoperative images during ERCP are provided for review. Initial image demonstrates an ERCP probe overlying the right upper abdominal quadrant. Surgical clips overlie the expected location of the gallbladder fossa. A surgical drain also overlies the expected location of the gallbladder fossa. Subsequent images demonstrate selective cannulation opacification of the CBD. There is apparent extravasation of injected contrast adjacent to the cholecystectomy clips as well as the cranial end of the surgically placed drain. There is no definitive opacification of the cystic duct though evaluation is degraded secondary to patient respiratory artifact. Completion image demonstrates placement of a internal plastic biliary stent overlying expected location of the CBD. IMPRESSION: 1. ERCP with biliary stent placement as above. 2. Contrast injection demonstrates extravasation of contrast about the cholecystectomy clips as well as the cranial aspect of the surgically placed drain indicative of a biliary leak. The cystic duct was not definitely identified though evaluation is degraded secondary to patient respiration. Correlation with the operative report is recommended. These images were submitted for radiologic interpretation only. Please see the procedural report for the amount of contrast and the fluoroscopy time utilized. Electronically Signed   By: Simonne ComeJohn  Watts M.D.   On: 02/08/2015 14:56   Mr Abd W/wo Cm/mrcp  02/18/2015  CLINICAL DATA:  Status post laparoscopic cholecystectomy on 01/27/2015, bile leak via surgical wound, pain/tenderness EXAM: MRI ABDOMEN WITHOUT AND WITH CONTRAST (INCLUDING MRCP) TECHNIQUE: Multiplanar  multisequence MR  imaging of the abdomen was performed both before and after the administration of intravenous contrast. Heavily T2-weighted images of the biliary and pancreatic ducts were obtained, and three-dimensional MRCP images were rendered by post processing. CONTRAST:  12mL MULTIHANCE GADOBENATE DIMEGLUMINE 529 MG/ML IV SOLN COMPARISON:  Nuclear medicine hepatobiliary scan dated 02/18/2015. CT abdomen pelvis dated 02/09/2015. ERCP dated 02/08/2015. FINDINGS: Lower chest: Mild patchy opacity at the left lung base (series 7/image 6). Cardiomegaly. Hepatobiliary: Liver is notable for mild hepatic steatosis. No suspicious/enhancing hepatic lesions. Mild increased perfusion along the gallbladder fossa. Status post cholecystectomy. No intrahepatic or extrahepatic ductal dilatation. Common duct measures 5 mm (series 6/image 24). No choledocholithiasis is seen. 1.6 x 2.6 cm complex fluid collection with layering fluid-fluid level in the gallbladder fossa (series 8/image 21), likely reflecting a biloma. This collection is adjacent to susceptibility artifact from a surgical clips (series 8/image 20), likely corresponding to the known bile leak at the cystic duct remnant/stump. Direct drainage of the collection to the skin surface via a catheter tract along the right anterior abdomen and exiting the right lateral abdominal wall (series 8/images 24, 27, and 35). Pancreas: Within normal limits. Spleen: Within normal limits. Adrenals/Urinary Tract: Adrenal glands are within normal limits. Kidneys within normal limits.  No hydronephrosis. Stomach/Bowel: Stomach is within normal limits. Visualized bowel is unremarkable. Vascular/Lymphatic: No evidence of abdominal aortic aneurysm. No suspicious abdominal lymphadenopathy. Other: No abdominal ascites. Musculoskeletal: No focal osseous lesions. IMPRESSION: Status post cholecystectomy. No intrahepatic or extrahepatic ductal dilatation. Common duct measures 5 mm. No choledocholithiasis is seen.  1.6 x 2.6 cm complex fluid collection/biloma in the gallbladder fossa, likely corresponding to the known bile leak at the cystic duct remnant/stump. Drug drainage of the collection to the skin surface deviated a catheter tract along the right anterior abdomen and exiting the right lateral abdominal wall. Additional ancillary findings as above. Electronically Signed   By: Charline Bills M.D.   On: 02/18/2015 19:43   US Abdomen Limited Ruq  01/25/2015  CLINICAL DATA:  59 year old with acute onset of upper abdominal pain associated with nausea, vomiting and diarrhea which began 3 days ago. EXAM: US ABDOMEN LIMITED - RIGHT UPPER QUADRANT COMPARISON:  CT abdomen and pelvis performed earlier same date. FINDINGS: Gallbladder: Numerous shadowing gallstones and echogenic sludge. Gallbladder wall thickening, likely overestimated on ultrasound due to the gas within the gallbladder lumen as noted on the earlier CT. Positive sonographic Murphy sign according to the ultrasound technologist. Common bile duct: Diameter: Approximately 5 mm diameter. Gas within the common bile duct as noted on the CT. Liver: Multilocular fluid collection centrally in the liver as noted on CT measuring approximately 3.3 x 2.1 cm. No other focal hepatic parenchymal abnormality. Patent portal vein with antegrade flow. IMPRESSION: 1. Cholelithiasis and acute cholecystitis. Gas within the gallbladder fundus and the nondistended common bile duct as noted on earlier CT, likely due to a gas producing organism. Less likely, the gas could arise due to a fistula with adjacent bowel. 2. Multilocular fluid collection centrally in the liver as noted on earlier CT, likely a liver abscess, given its proximity to the infected gallbladder. Electronically Signed   By: Hulan Saas M.D.   On: 01/25/2015 07:59    ASSESSMENT/PLAN: 1. Abnormal EKG - EKGs reviewed as far back as 02/2014 which show a similar appearance to EKG done on admission. These show NSR  with LVH and repolarization abnormality.  2. ASCAD with history of remote PCI of the mid RCA  with DES in 2012 and other unknown artery- she denies any anginal CP or SOB prior to her cholecystectomy but since then has had some chest pain in the left upper chest similar to her prior angina but also eased some with belching. This could be due to underlying gastrointestinal issues but also could represent underlying coronary ischemia in the setting of acute stress from underlying illness. Troponin is negative. She did well without any cardiac complications during recent cholecystectomy 01/27/2015. Nuc med stress test in setting of similar appearing EKG 02/2014 showed no ischemia and normal LVF. Norberta Keens this admission showed 2 reversible defects involving the lateral wall. The smaller defect is located along the anterior aspect of the lateral wall near the apex and the other slightly larger defect is located in the midportion of the lateral wall posteriorly. I have discussed case with interventional colleagues. Difficult situation in that if she has obstructive CAD requiring PCI, she would need to be on DAPT therapy for at least a month post cath prior to being able to hold antiplatelet therapy for any procedures. ERCP is low risk procedure and patient would only be under anesthesia. Imdur added for antianginal therapy.  ERCP on hold for now due to patient's clinical improvement. If patient ends up needing further abdominal surgery then would need cath prior to surgery to assess further cardiac risk.  3. Gangrenous GB s/p cholecystectomy now with biliary leak which appears to have slowed down - per GI  4. Abdominal pain, nausea and vomiting secondary to #3 - improved 5. Acute renal failure secondary to dehydration and volume depletion. This is resolved. 6. HTN - improved on BB/clonidine/amlodipine but still borderline controlled.  Will increase amlodipine to  daily. 7. Hypokalemia -  replete per hospitalist    Quintella Reichert, MD  02/22/2015  8:55 AM

## 2015-02-22 NOTE — Progress Notes (Signed)
Received call from M.D.,was directed me to educate the patient to take her potassium medicines.Spoke to the patient ,explained what the medicines does to her and effects of having low blood potassium level into her system.Explored why patient was so hesitant to take her prescribed medicines.Encouraged to take it with an apple sauce.

## 2015-02-22 NOTE — Progress Notes (Signed)
CRITICAL VALUE ALERT  Critical value received: Potassium level=2.5  Date of notification:  02/22/15  Time of notification:  06:46  Critical value read back:Yes.    Nurse who received alert:  Dorna BloomNikki Murphy,RN  MD notified (1st page):  K. Schorr,NP  Time of first page:  06:53  MD notified (2nd page):  Time of second page:  Responding MD:  Leodis RainsK. Schorr,NP  Time MD responded:  Orders placed in epic

## 2015-02-22 NOTE — Progress Notes (Signed)
02/22/2015 9:00 AM  Patient refusing to complete IV potassium runs. Stated it burns to bad and making her feel worse than when she came in. Charge Nurse aware. MD made aware, Will continue to assess and monitor the patient.   PACCAR Incyanne Hill BSN, RN-BC, [employer] [facility] Phone [phone]

## 2015-02-23 DIAGNOSIS — I25118 Atherosclerotic heart disease of native coronary artery with other forms of angina pectoris: Secondary | ICD-10-CM

## 2015-02-23 DIAGNOSIS — E876 Hypokalemia: Secondary | ICD-10-CM

## 2015-02-23 DIAGNOSIS — R7989 Other specified abnormal findings of blood chemistry: Secondary | ICD-10-CM

## 2015-02-23 DIAGNOSIS — E43 Unspecified severe protein-calorie malnutrition: Secondary | ICD-10-CM

## 2015-02-23 DIAGNOSIS — N179 Acute kidney failure, unspecified: Secondary | ICD-10-CM

## 2015-02-23 DIAGNOSIS — R748 Abnormal levels of other serum enzymes: Secondary | ICD-10-CM

## 2015-02-23 LAB — BASIC METABOLIC PANEL
ANION GAP: 7 (ref 5–15)
BUN: <5 mg/dL — ABNORMAL LOW (ref 6–20)
CALCIUM: 8.4 mg/dL — AB (ref 8.9–10.3)
CHLORIDE: 100 mmol/L — AB (ref 101–111)
CO2: 30 mmol/L (ref 22–32)
CREATININE: 0.52 mg/dL (ref 0.44–1.00)
GFR calc non Af Amer: >60 mL/min (ref >60)
GLUCOSE: 128 mg/dL — AB (ref 65–99)
Potassium: 2.5 mmol/L — CL (ref 3.5–5.1)
Sodium: 137 mmol/L (ref 135–145)

## 2015-02-23 LAB — MAGNESIUM: Magnesium: 2.9 mg/dL — ABNORMAL HIGH (ref 1.7–2.4)

## 2015-02-23 LAB — POTASSIUM: POTASSIUM: 3.7 mmol/L (ref 3.5–5.1)

## 2015-02-23 MED ORDER — POTASSIUM CHLORIDE CRYS ER 20 MEQ PO TBCR
40.0000 meq | EXTENDED_RELEASE_TABLET | ORAL | Status: AC
  Administered 2015-02-23 (×3): 40 meq via ORAL
  Filled 2015-02-23 (×3): qty 2

## 2015-02-23 MED ORDER — ENSURE ENLIVE PO LIQD: 237.0000 mL | Freq: Three times a day (TID) | ORAL | Status: DC

## 2015-02-23 MED ORDER — AMLODIPINE BESYLATE 10 MG PO TABS: 10.0000 mg | ORAL_TABLET | Freq: Every day | ORAL | Status: DC

## 2015-02-23 MED ORDER — ONDANSETRON HCL 4 MG PO TABS: 8.0000 mg | ORAL_TABLET | Freq: Four times a day (QID) | ORAL | Status: DC

## 2015-02-23 MED ORDER — ISOSORBIDE MONONITRATE ER 30 MG PO TB24: 30.0000 mg | ORAL_TABLET | Freq: Every day | ORAL | Status: DC

## 2015-02-23 NOTE — Progress Notes (Signed)
0 output for the pouch all night. There is a scant amount of a yellow-ish drainage in the ostomy bag but it is not enough to measure.

## 2015-02-23 NOTE — Progress Notes (Signed)
Patient to be discharged to home with spouse. PIV removed. Tele box #23 removed and returned to nurse's station. Handouts on new medications norvasc, imdur, and zofran given to patient. Prescriptions given to patient. Discharge instructions reviewed with patient and spouse, including follow up appts, when to call the doctor, and medication administration. Patient explained wound care/drain site care to RN before discharge; stated she felt ok with doing this independently at home. Sent with extra supplies that were already in patient's room. Instructed to call unit with any questions she may have once she's home.   Leanna BattlesEckelmann, Azora Bonzo Eileen, RN.

## 2015-02-23 NOTE — Discharge Summary (Addendum)
Physician Discharge Summary  Gilberte Gorley RUE:[id number] DOB: [birth year] DOA: 02/17/2015  PCP: [name], MD  Admit date: 02/17/2015 Discharge date: 02/23/2015  Time spent: 35 minutes  Recommendations for Outpatient Follow-up:  1. Discharge home with outpatient follow-up with PCP, cardiology and Dr. Lovell Sheehan (surgery) 2. Monitor potassium during outpatient follow-up. 3. Patient should follow-up with [facility] (Dr. Ewing Schlein) if has recurrent abdominal pain to consider for repeat ERCP.  Discharge Diagnoses:  Principal problem Abdominal pain with nausea and vomiting secondary to continues biliary leak  Active Problems:   Accelerated hypertension   CAD (coronary artery disease)   Bile leak, postoperative   Acute renal failure (ARF) (HCC)   Hypertension   Elevated LFTs   Elevated lipase   Protein-calorie malnutrition, severe   Right upper quadrant pain   Preoperative cardiovascular examination   Pain in the chest   Hypokalemia, severe Hypomagnesemia   Discharge Condition: Fair  Diet recommendation: Low-sodium  Filed Weights   02/19/15 2040 02/21/15 2053 02/22/15 2100  Weight: 60.1 kg (132 lb 7.9 oz) 61.2 kg (134 lb 14.7 oz) 62.3 kg (137 lb 5.6 oz)    History of present illness:  Please refer to admission H&P for details, in brief, 59 y.o. female, past medical history of coronary artery disease, hypertension, lupus and hyperlipidemia, patient with recent hospitalization at [facility], underwent open cholecystectomy on 01/27/2015, she was found to have gangrenous gallbladder and empyema, had JP drain placed in subhepatic space is due to the necrotic nature of the cystic duct, patient continues to have significant output, underwent ERCP on 02/07/2015 with biliary stent placement. Patient was seen today before the admission by the surgery and JP drain was discontinued in the office. Patient complained of abdominal pain, nausea and vomiting and was sent to the ED by her  PCP.  ED workup showed worsening LFTs, and acute renal failure with creatinine of 2.2, mild leukocytosis of 11.7, had CT abdomen pelvis done which did show fluid collection in gallbladder fossa tracking anteriorly. Hospitalist service requested for admission. GI and general surgery was consulted. IR was unable to place any drain. Patient was planned to have ERCP however due to abnormal EKG at the time of admission, ERCP was canceled. Fistula pouch was placed to the biliary leak. Cardiology was consulted for clearance. The patient underwent stress test which was positive. However at this time, patient has been improving, biliary output has decreased significantly. ERCP was then postponed by GI and patient was started on diet which she has been tolerating well.   Hospital Course:   Principal problem Abdominal pain, nausea and vomiting secondary to continuous biliary leak. LFTs and lipase improved. -likely due to continuous biliary leak, patient status post biliary stent placement, her JP drain was discontinued on 10/27. Per IR, not enough fluid collection to drain.  - HIDA scan + for biliary leak and MRCP with 1.6 x 2.6 cm complex fluid collection/bilioma in the gallbladder fossa  - ERCP was canceled due to abnormal EKG, ST depressions/repolarization abnormalities. Stress test positive - Fistula pouch placed to the biliary leak - Patient has been cleared for ERCP by cardiology however patient has not had much drainage now for the last 3 days and ERCP is now deferred. Patient is been tolerating advanced diet and does not have further symptoms.  - GI to follow outpatient to decide if repeat ERCP is going to be needed. -Follow-up with Dr. Lovell Sheehan as outpatient.  Active problems  Abnormal EKG on admission in the setting  of CAD with prior stents - EKG on admission had shown ST depression in inferior leads. No chest pain or SOB. Troponin 0, repeat ekg with no ischemic changes. Prior hx of CAD, hx  MI 2012, mid RCA stent, (Dr Diona Browner in [place]), nuc med stress test in 02/2014 had shown no reversible ischemia or infarction, normal EF 61%  - Stress test showed 2 reversible defects involving the lateral wall, smaller defect along the anterior aspect of the lateral wall near the apex, other slightly larger defect in the midportion of the lateral wall posteriorly.  - Per cardiology, for now cleared for ERCP, difficult situation if she has obstructive CAD requiring PCI, patient will need dual antiplatelet therapy for at least a month post-cath. -Since her chest pain has resolved with long-acting nitrates and non-GI procedure pain at this time cardiology do not recommend cardiac cath for now. She will follow up with her cardiologist Dr. Diona Browner as outpatient and will decide on cath versus medical management.  Accelerated hypertension Resumed home dose clonidine and metoprolol. Added Imdur and amlodipine and currently well-controlled. Followed as outpatient.   Acute kidney injury  likely related to dehydration and volume depletion from her nausea and vomiting. -Resolved with IV fluids.  Hypokalemia, hypomagnesemia - Replenished.  Elevated LFTs - Most likely related to her biliary leak, improving  Elevated lipase - Mildly elevated, no evidence of pancreatitis on CT abdomen and pelvis. Now resolved.  Protein calorie malnutrition Added supplement  Code Status: Full code   Family Communication: Patient and her husband at bedside Disposition Plan: Home with outpatient follow-up   Procedures  CT abdomen   Consults  Interventional radiology  GI Gem surgery  Discharge Exam: Filed Vitals:   02/23/15 1000  BP: 138/70  Pulse: 72  Temp: 98.6 F (37 C)  Resp:     General: Elderly female not in distress  HEENT: No pallor, moist oral mucosa, supple neck Chest: Clear to auscultation bilaterally no sign  CVS: Normal S1 and S2, no murmurs rub or gallop GI: Soft,  nondistended, nontender, bowel sounds present  musculoskeletal: Warm, no edema  CNS: Alert and oriented   Discharge Instructions    Current Discharge Medication List    START taking these medications   Details  amLODipine (NORVASC) 10 MG tablet Take 1 tablet (10 mg total) by mouth daily. Qty: 30 tablet, Refills: 0    feeding supplement, ENSURE ENLIVE, (ENSURE ENLIVE) LIQD Take 237 mLs by mouth 3 (three) times daily between meals. Qty: 237 mL, Refills: 12    isosorbide mononitrate (IMDUR) 30 MG 24 hr tablet Take 1 tablet (30 mg total) by mouth daily. Qty: 30 tablet, Refills: 0      CONTINUE these medications which have CHANGED   Details  ondansetron (ZOFRAN) 4 MG tablet Take 2 tablets (8 mg total) by mouth every 6 (six) hours. Qty: 30 tablet, Refills: 0      CONTINUE these medications which have NOT CHANGED   Details  cloNIDine (CATAPRES) 0.2 MG tablet Take 1 tablet (0.2 mg total) by mouth 2 (two) times daily. Qty: 60 tablet, Refills: 1    diphenhydramine-acetaminophen (TYLENOL PM) 25-500 MG TABS tablet Take 1 tablet by mouth at bedtime as needed (sleep,pain).    docusate sodium (COLACE) 100 MG capsule Take 100 mg by mouth 2 (two) times daily.    escitalopram (LEXAPRO) 10 MG tablet Take 10 mg by mouth daily.    metoprolol (LOPRESSOR) 50 MG tablet Take 50 mg by mouth 3 (three)  times daily.    Multiple Vitamins-Calcium (ONE-A-DAY WOMENS FORMULA PO) Take 1 tablet by mouth daily.    Multiple Vitamins-Minerals (HAIR/SKIN/NAILS PO) Take 1 tablet by mouth daily.    omeprazole (PRILOSEC) 40 MG capsule Take 40 mg by mouth daily.      STOP taking these medications     oxyCODONE-acetaminophen (PERCOCET) 7.5-325 MG tablet        Allergies  Allergen Reactions  . Hydrocodone-Acetaminophen Nausea And Vomiting   Follow-up Information    Follow up with Joni ReiningKathryn Lawrence, NP On 03/03/2015.   Specialties:  Nurse Practitioner, Radiology, Cardiology   Why:  1:50 pm (Dr.  McDowell's PA)    Contact information:   [address] [place] [address] [phone]       Follow up with Geraldo PitterBLAND,VEITA J, MD. Schedule an appointment as soon as possible for a visit in 1 week.   Specialty:  Family Medicine   Contact information:   [address] [place] [address] [phone]       [facility] In 4 weeks.   Specialty:  Gastroenterology   Why:  as needed   Contact information:   [address] [place] [address] [phone]        The results of significant diagnostics from this hospitalization (including imaging, microbiology, ancillary and laboratory) are listed below for reference.    Significant Diagnostic Studies: Ct Abdomen Pelvis Wo Contrast  02/17/2015  CLINICAL DATA:  Recent cholecystectomy for gangrenous cholecystitis. Right upper quadrant abdominal pain. EXAM: CT ABDOMEN AND PELVIS WITHOUT CONTRAST TECHNIQUE: Multidetector CT imaging of the abdomen and pelvis was performed following the standard protocol without IV contrast. COMPARISON:  01/25/2015 FINDINGS: Lower chest: Patchy parenchymal densities in the left lower lobe are concerning for a small focus of aspiration or pneumonia. Evidence for coronary artery calcifications. Hepatobiliary: There is a nonmetallic biliary stent with the distal aspect in the duodenum. Cholecystectomy clips are noted. Vague low-density in the gallbladder fossa is suggestive for fluid. This fluid tracks anterior towards the abdominal wall. Tiny focus of gas near the gallbladder fossa. Three small calcific foci between the liver and right kidney upper pole are concerning for retained gallstones. These small calcifications are grouped together and roughly measure up to 1 cm in size. Inflammatory changes along the right upper anterior abdominal wall related to recent surgery. Limited evaluation of the liver and gallbladder fossa without intravenous contrast. Pancreas: Normal appearance  of the pancreas without duct dilatation or inflammation. Spleen: No acute abnormality to the spleen. Adrenals/Urinary Tract: Normal appearance of the adrenal glands. Normal appearance of both kidneys without stones or hydronephrosis. Normal appearance of the urinary bladder. Stomach/Bowel: Moderate stool burden throughout the colon without acute colonic inflammation. Normal appearance of small bowel without obstruction. There may be mild inflammation of the proximal duodenum adjacent to the liver. Vascular/Lymphatic: Atherosclerotic calcifications in the aorta and iliac arteries without aneurysm. No significant abdominal or pelvic lymphadenopathy. Reproductive: Uterus is surgically absent. Other: No significant free fluid. Musculoskeletal: No suspicious bone lesions. IMPRESSION: Post cholecystectomy. There is a small amount of fluid and gas in the gallbladder fossa with expected inflammatory changes from recent surgery. Nonmetallic biliary stent. Small calcifications between the liver and right kidney are suggestive for retained gallstones. Patchy densities in the left lower lobe are concerning for a small focus of pneumonia or aspiration pneumonitis. Electronically Signed   By: Richarda OverlieAdam  Henn M.D.   On: 02/17/2015 17:26  Dg Chest 2 View  02/17/2015  CLINICAL DATA:  Fever, chest pain, shortness of breath. EXAM: CHEST  2 VIEW COMPARISON:  January 25, 2015. FINDINGS: The heart size and mediastinal contours are within normal limits. Both lungs are clear. No pneumothorax or pleural effusion is noted. The visualized skeletal structures are unremarkable. IMPRESSION: No active cardiopulmonary disease. Electronically Signed   By: Lupita Raider, M.D.   On: 02/17/2015 15:50   X-ray Chest Pa And Lateral  01/25/2015  CLINICAL DATA:  Pt states she has been having stomach pain onset for 4 days. Pt states she has been vomiting and feeling nauseous. EXAM: CHEST  2 VIEW COMPARISON:  03/20/2014 FINDINGS: Cardiac silhouette is  borderline enlarged. No mediastinal or hilar masses or pathologically enlarged lymph nodes. There is a right coronary artery stent. Clear lungs.  No pleural effusion or pneumothorax. Bony thorax is intact. IMPRESSION: No acute cardiopulmonary disease. Electronically Signed   By: Amie Portland M.D.   On: 01/25/2015 12:40   Nm Hepatobiliary Including Gb  02/18/2015  CLINICAL DATA:  Status post cholecystectomy, evaluate for bile leak EXAM: NUCLEAR MEDICINE HEPATOBILIARY IMAGING TECHNIQUE: Sequential images of the abdomen were obtained out to 60 minutes following intravenous administration of radiopharmaceutical. RADIOPHARMACEUTICALS:  5.3 mCi Tc-46m  Choletec IV COMPARISON:  CT abdomen pelvis dated 02/17/2015 FINDINGS: Normal hepatic excretion. Within 15 minutes, radiotracer begins to pool along the inferior liver margin. This is clearly extraluminal and reflects a bile leak. Study was aborted after 45 minutes. IMPRESSION: Study is positive for bile leak. These results will be called to the ordering clinician or representative by the Radiologist Assistant, and communication documented in the PACS or zVision Dashboard. Electronically Signed   By: Charline Bills M.D.   On: 02/18/2015 14:06   Ct Abdomen Pelvis W Contrast  01/25/2015  CLINICAL DATA:  Diffuse abdominal pain.  Fever and diarrhea EXAM: CT ABDOMEN AND PELVIS WITH CONTRAST TECHNIQUE: Multidetector CT imaging of the abdomen and pelvis was performed using the standard protocol following bolus administration of intravenous contrast. CONTRAST:  50mL OMNIPAQUE IOHEXOL 300 MG/ML SOLN, OMNIPAQUE IOHEXOL 300 MG/ML SOLN COMPARISON:  None. FINDINGS: Lower chest: Minimal bibasilar atelectasis. No infiltrate or effusion in the lung bases. Heart size upper normal. Hepatobiliary: Gallbladder is distended with gallbladder wall thickening. Mild stranding in the pericholecystic fat. There is a small gas bubble in the fundus of the gallbladder. Multiple calcified  gallstones are present. There is gas in the cystic duct and common bile duct. The common bile duct is nondilated. Anterior to the cystic duct in the porta hepatis there is a multilocular cyst measuring 33 mm in diameter. No additional liver lesions identified. No free air no free fluid. Pancreas: Normal pancreas. Negative for pancreatitis or pancreatic mass. Spleen: Negative Adrenals/Urinary Tract: Negative for renal obstruction. No renal mass or renal calculi. Urinary bladder normal. Stomach/Bowel: Negative for bowel obstruction. Small hiatal hernia. Negative for bowel edema. Appendix not visualized. Vascular/Lymphatic: Atherosclerotic calcification in the aorta and iliac arteries. Negative for aortic aneurysm. Reproductive: Hysterectomy.  Negative for pelvic mass. Other: No free fluid.  No free air.  Negative for adenopathy. Musculoskeletal: Minimal lumbar degenerative change. No acute skeletal abnormality. IMPRESSION: Cholelithiasis. Distended gallbladder with gallbladder wall thickening and mild pericholecystic edema. Findings suggest acute cholecystitis. In addition, there is gas in the gallbladder lumen as well as in the cystic duct and common bile duct compatible with cholecystitis. 33 mm multilocular cyst in the porta hepatis. This may represent a biliary cystadenoma  or septated hepatic cyst. Appendix not visualized. Electronically Signed   By: Marlan Palau M.D.   On: 01/25/2015 07:17   Mr 3d Recon At Scanner  02/18/2015  CLINICAL DATA:  Status post laparoscopic cholecystectomy on 01/27/2015, bile leak via surgical wound, pain/tenderness EXAM: MRI ABDOMEN WITHOUT AND WITH CONTRAST (INCLUDING MRCP) TECHNIQUE: Multiplanar multisequence MR imaging of the abdomen was performed both before and after the administration of intravenous contrast. Heavily T2-weighted images of the biliary and pancreatic ducts were obtained, and three-dimensional MRCP images were rendered by post processing. CONTRAST:  12mL  MULTIHANCE GADOBENATE DIMEGLUMINE 529 MG/ML IV SOLN COMPARISON:  Nuclear medicine hepatobiliary scan dated 02/18/2015. CT abdomen pelvis dated 02/09/2015. ERCP dated 02/08/2015. FINDINGS: Lower chest: Mild patchy opacity at the left lung base (series 7/image 6). Cardiomegaly. Hepatobiliary: Liver is notable for mild hepatic steatosis. No suspicious/enhancing hepatic lesions. Mild increased perfusion along the gallbladder fossa. Status post cholecystectomy. No intrahepatic or extrahepatic ductal dilatation. Common duct measures 5 mm (series 6/image 24). No choledocholithiasis is seen. 1.6 x 2.6 cm complex fluid collection with layering fluid-fluid level in the gallbladder fossa (series 8/image 21), likely reflecting a biloma. This collection is adjacent to susceptibility artifact from a surgical clips (series 8/image 20), likely corresponding to the known bile leak at the cystic duct remnant/stump. Direct drainage of the collection to the skin surface via a catheter tract along the right anterior abdomen and exiting the right lateral abdominal wall (series 8/images 24, 27, and 35). Pancreas: Within normal limits. Spleen: Within normal limits. Adrenals/Urinary Tract: Adrenal glands are within normal limits. Kidneys within normal limits.  No hydronephrosis. Stomach/Bowel: Stomach is within normal limits. Visualized bowel is unremarkable. Vascular/Lymphatic: No evidence of abdominal aortic aneurysm. No suspicious abdominal lymphadenopathy. Other: No abdominal ascites. Musculoskeletal: No focal osseous lesions. IMPRESSION: Status post cholecystectomy. No intrahepatic or extrahepatic ductal dilatation. Common duct measures 5 mm. No choledocholithiasis is seen. 1.6 x 2.6 cm complex fluid collection/biloma in the gallbladder fossa, likely corresponding to the known bile leak at the cystic duct remnant/stump. Drug drainage of the collection to the skin surface deviated a catheter tract along the right anterior abdomen and  exiting the right lateral abdominal wall. Additional ancillary findings as above. Electronically Signed   By: Charline Bills M.D.   On: 02/18/2015 19:43   Nm Myocar Multi W/spect W/wall Motion / Ef  02/20/2015  CLINICAL DATA:  Chest pain.  Coronary artery disease. EXAM: MYOCARDIAL IMAGING WITH SPECT (REST AND PHARMACOLOGIC-STRESS) GATED LEFT VENTRICULAR WALL MOTION STUDY LEFT VENTRICULAR EJECTION FRACTION TECHNIQUE: Standard myocardial SPECT imaging was performed after resting intravenous injection of 10 mCi Tc-8m sestamibi. Subsequently, intravenous infusion of Lexiscan was performed under the supervision of the Cardiology staff. At peak effect of the drug, 30 mCi Tc-25m sestamibi was injected intravenously and standard myocardial SPECT imaging was performed. Quantitative gated imaging was also performed to evaluate left ventricular wall motion, and estimate left ventricular ejection fraction. COMPARISON:  03/21/2014 FINDINGS: Perfusion: 2 reversible defects involving the lateral wall. The smaller defect is located along the anterior aspect of the lateral wall near the apex and the other slightly larger defect is located in the midportion of the lateral wall posteriorly. Wall Motion: Lateral wall motion abnormalities. The remaining walls of the left ventricle demonstrate normal wall motion and thickening with contraction. Left Ventricular Ejection Fraction: 70 % End diastolic volume 84 ml End systolic volume 25 ml IMPRESSION: 1. Two reversible defects involving the lateral wall consistent with areas of ischemia. 2. Lateral  wall motion abnormality. 3. Left ventricular ejection fraction 70% 4. High-risk stress test findings*. *2012 Appropriate Use Criteria for Coronary Revascularization Focused Update: J Am Coll Cardiol. 2012;59(9):857-881. http://content.dementiazones.com.aspx?articleid=1201161 These results will be called to the ordering clinician or representative by the Radiologist Assistant, and  communication documented in the PACS or zVision Dashboard. Electronically Signed   By: Rudie Meyer M.D.   On: 02/20/2015 15:36   Dg Ercp Biliary & Pancreatic Ducts  02/08/2015  CLINICAL DATA:  Sphincterotomy.  Biliary stent placement. EXAM: ERCP TECHNIQUE: Multiple spot images obtained with the fluoroscopic device and submitted for interpretation post-procedure. COMPARISON:  CT abdomen and pelvis- 01/25/2015; abdominal ultrasound - 01/25/2015 FINDINGS: Three spot intraoperative images during ERCP are provided for review. Initial image demonstrates an ERCP probe overlying the right upper abdominal quadrant. Surgical clips overlie the expected location of the gallbladder fossa. A surgical drain also overlies the expected location of the gallbladder fossa. Subsequent images demonstrate selective cannulation opacification of the CBD. There is apparent extravasation of injected contrast adjacent to the cholecystectomy clips as well as the cranial end of the surgically placed drain. There is no definitive opacification of the cystic duct though evaluation is degraded secondary to patient respiratory artifact. Completion image demonstrates placement of a internal plastic biliary stent overlying expected location of the CBD. IMPRESSION: 1. ERCP with biliary stent placement as above. 2. Contrast injection demonstrates extravasation of contrast about the cholecystectomy clips as well as the cranial aspect of the surgically placed drain indicative of a biliary leak. The cystic duct was not definitely identified though evaluation is degraded secondary to patient respiration. Correlation with the operative report is recommended. These images were submitted for radiologic interpretation only. Please see the procedural report for the amount of contrast and the fluoroscopy time utilized. Electronically Signed   By: Simonne Come M.D.   On: 02/08/2015 14:56   Mr Abd W/wo Cm/mrcp  02/18/2015  CLINICAL DATA:  Status post  laparoscopic cholecystectomy on 01/27/2015, bile leak via surgical wound, pain/tenderness EXAM: MRI ABDOMEN WITHOUT AND WITH CONTRAST (INCLUDING MRCP) TECHNIQUE: Multiplanar multisequence MR imaging of the abdomen was performed both before and after the administration of intravenous contrast. Heavily T2-weighted images of the biliary and pancreatic ducts were obtained, and three-dimensional MRCP images were rendered by post processing. CONTRAST:  12mL MULTIHANCE GADOBENATE DIMEGLUMINE 529 MG/ML IV SOLN COMPARISON:  Nuclear medicine hepatobiliary scan dated 02/18/2015. CT abdomen pelvis dated 02/09/2015. ERCP dated 02/08/2015. FINDINGS: Lower chest: Mild patchy opacity at the left lung base (series 7/image 6). Cardiomegaly. Hepatobiliary: Liver is notable for mild hepatic steatosis. No suspicious/enhancing hepatic lesions. Mild increased perfusion along the gallbladder fossa. Status post cholecystectomy. No intrahepatic or extrahepatic ductal dilatation. Common duct measures 5 mm (series 6/image 24). No choledocholithiasis is seen. 1.6 x 2.6 cm complex fluid collection with layering fluid-fluid level in the gallbladder fossa (series 8/image 21), likely reflecting a biloma. This collection is adjacent to susceptibility artifact from a surgical clips (series 8/image 20), likely corresponding to the known bile leak at the cystic duct remnant/stump. Direct drainage of the collection to the skin surface via a catheter tract along the right anterior abdomen and exiting the right lateral abdominal wall (series 8/images 24, 27, and 35). Pancreas: Within normal limits. Spleen: Within normal limits. Adrenals/Urinary Tract: Adrenal glands are within normal limits. Kidneys within normal limits.  No hydronephrosis. Stomach/Bowel: Stomach is within normal limits. Visualized bowel is unremarkable. Vascular/Lymphatic: No evidence of abdominal aortic aneurysm. No suspicious abdominal lymphadenopathy. Other: No abdominal  ascites.  Musculoskeletal: No focal osseous lesions. IMPRESSION: Status post cholecystectomy. No intrahepatic or extrahepatic ductal dilatation. Common duct measures 5 mm. No choledocholithiasis is seen. 1.6 x 2.6 cm complex fluid collection/biloma in the gallbladder fossa, likely corresponding to the known bile leak at the cystic duct remnant/stump. Drug drainage of the collection to the skin surface deviated a catheter tract along the right anterior abdomen and exiting the right lateral abdominal wall. Additional ancillary findings as above. Electronically Signed   By: Charline Bills M.D.   On: 02/18/2015 19:43   US Abdomen Limited Ruq  01/25/2015  CLINICAL DATA:  59 year old with acute onset of upper abdominal pain associated with nausea, vomiting and diarrhea which began 3 days ago. EXAM: US ABDOMEN LIMITED - RIGHT UPPER QUADRANT COMPARISON:  CT abdomen and pelvis performed earlier same date. FINDINGS: Gallbladder: Numerous shadowing gallstones and echogenic sludge. Gallbladder wall thickening, likely overestimated on ultrasound due to the gas within the gallbladder lumen as noted on the earlier CT. Positive sonographic Murphy sign according to the ultrasound technologist. Common bile duct: Diameter: Approximately 5 mm diameter. Gas within the common bile duct as noted on the CT. Liver: Multilocular fluid collection centrally in the liver as noted on CT measuring approximately 3.3 x 2.1 cm. No other focal hepatic parenchymal abnormality. Patent portal vein with antegrade flow. IMPRESSION: 1. Cholelithiasis and acute cholecystitis. Gas within the gallbladder fundus and the nondistended common bile duct as noted on earlier CT, likely due to a gas producing organism. Less likely, the gas could arise due to a fistula with adjacent bowel. 2. Multilocular fluid collection centrally in the liver as noted on earlier CT, likely a liver abscess, given its proximity to the infected gallbladder. Electronically Signed   By:  Hulan Saas M.D.   On: 01/25/2015 07:59    Microbiology: No results found for this or any previous visit (from the past 240 hour(s)).   Labs: Basic Metabolic Panel:  Recent Labs Lab 02/19/15 0832 02/20/15 0505 02/20/15 1558 02/21/15 0555 02/22/15 0520 02/22/15 1128 02/22/15 1910 02/23/15 0550 02/23/15 1525  NA 144 141  --  134* 134*  --   --  137  --   K 3.3* 2.9* 3.4* 3.0* 2.5* 2.7* 2.8* 2.5* 3.7  CL 110 106  --  100* 98*  --   --  100*  --   CO2 22 24  --  26 29  --   --  30  --   GLUCOSE 107* 117*  --  115* 119*  --   --  128*  --   BUN 6 <5*  --  <5* <5*  --   --  <5*  --   CREATININE 0.71 0.59  --  0.59 0.57  --   --  0.52  --   CALCIUM 9.3 9.1  --  8.3* 8.8*  --   --  8.4*  --   MG  --   --  1.6*  --  1.5*  --   --  2.9*  --    Liver Function Tests:  Recent Labs Lab 02/18/15 0825 02/19/15 0832 02/20/15 0505 02/21/15 0555 02/22/15 0520  AST 45* 29 30 20 17   ALT 54 42 36 27 21  ALKPHOS 106 107 103 90 87  BILITOT 0.8 0.7 0.6 0.6 0.7  PROT 6.9 7.0 6.3* 6.0* 5.7*  ALBUMIN 2.8* 2.9* 2.8* 2.7* 2.6*    Recent Labs Lab 02/17/15 1459 02/18/15 0825 02/19/15 0832  LIPASE 101* 69*  72*   No results for input(s): AMMONIA in the last 168 hours. CBC:  Recent Labs Lab 02/17/15 1459 02/18/15 0825 02/19/15 0832 02/20/15 0505 02/22/15 0520  WBC 11.7* 9.3 10.1 9.1 6.1  NEUTROABS  --   --   --   --  3.0  HGB 12.9 9.6* 10.2* 9.5* 9.9*  HCT 38.7 29.6* 32.1* 29.1* 30.5*  MCV 87.8 88.9 90.2 89.8 90.8  PLT 674* 482* 433* 376 377   Cardiac Enzymes: No results for input(s): CKTOTAL, CKMB, CKMBINDEX, TROPONINI in the last 168 hours. BNP: BNP (last 3 results) No results for input(s): BNP in the last 8760 hours.  ProBNP (last 3 results) No results for input(s): PROBNP in the last 8760 hours.  CBG: No results for input(s): GLUCAP in the last 168 hours.     SignedEddie North  [facility] 02/23/2015, 4:48 PM

## 2015-02-23 NOTE — Progress Notes (Signed)
Sheila [name] 9:18 AM  Subjective: Patient doing well tolerating her diet with only very minimal drainage and no new complaints although her potassium is bothering her IV and she wants to go home  Objective: Vital signs stable afebrile no acute distress abdomen is soft nontender  Assessment: Bile leak improved  Plan: I am happy to see back as an outpatient and happy to proceed with repeat ERCP if needed and please let me know if I can help any further with this hospital stay and both the patient and her husband agree with the plan  [facility] HospitalMAGOD,Berk Pilot E  Pager [phone] After 5PM or if no answer call [phone]

## 2015-02-23 NOTE — Progress Notes (Signed)
[facility] Progress Note  4 Days Post-Op  Subjective: Pt has no pain, no N/V, tolerating solid diet.  No complaints, wants to go home.  Cards and GI cleared her.  Objective: Vital signs in last 24 hours: Temp:  [98.4 F (36.9 C)-98.7 F (37.1 C)] 98.4 F (36.9 C) (11/03 0456) Pulse Rate:  [72-84] 72 (11/03 0456) Resp:  [16-17] 16 (11/03 0456) BP: (139-140)/(59-71) 140/71 mmHg (11/03 0456) SpO2:  [98 %] 98 % (11/03 0456) Weight:  [62.3 kg (137 lb 5.6 oz)] 62.3 kg (137 lb 5.6 oz) (11/02 2100) Last BM Date: 02/22/15  Intake/Output from previous day: 11/02 0701 - 11/03 0700 In: 3170 [P.O.:720; I.V.:1850; IV Piggyback:600] Out: 2076 [Urine:2075; Stool:1] Intake/Output this shift: Total I/O In: 560 [P.O.:360; IV Piggyback:200] Out: -   PE: Gen:  Alert, NAD, pleasant Abd: Soft, NT, ND, +BS, no HSM, incisions healed, old drain site in RUQ, fistula bag placed with bilious drainage (210mL/24hr??)   Lab Results:   Recent Labs  02/22/15 0520  WBC 6.1  HGB 9.9*  HCT 30.5*  PLT 377   BMET  Recent Labs  02/22/15 0520  02/22/15 1910 02/23/15 0550  NA 134*  --   --  137  K 2.5*  < > 2.8* 2.5*  CL 98*  --   --  100*  CO2 29  --   --  30  GLUCOSE 119*  --   --  128*  BUN <5*  --   --  <5*  CREATININE 0.57  --   --  0.52  CALCIUM 8.8*  --   --  8.4*  < > = values in this interval not displayed. PT/INR No results for input(s): LABPROT, INR in the last 72 hours. CMP     Component Value Date/Time   NA 137 02/23/2015 0550   K 2.5* 02/23/2015 0550   CL 100* 02/23/2015 0550   CO2 30 02/23/2015 0550   GLUCOSE 128* 02/23/2015 0550   BUN <5* 02/23/2015 0550   CREATININE 0.52 02/23/2015 0550   CALCIUM 8.4* 02/23/2015 0550   PROT 5.7* 02/22/2015 0520   ALBUMIN 2.6* 02/22/2015 0520   AST 17 02/22/2015 0520   ALT 21 02/22/2015 0520   ALKPHOS 87 02/22/2015 0520   BILITOT 0.7 02/22/2015 0520   GFRNONAA >60 02/23/2015 0550   GFRAA >60 02/23/2015 0550   Lipase      Component Value Date/Time   LIPASE 72* 02/19/2015 0832       Studies/Results: No results found.  Anti-infectives: Anti-infectives    Start     Dose/Rate Route Frequency Ordered Stop   02/20/15 0900  ciprofloxacin (CIPRO) IVPB 400 mg  Status:  Discontinued     400 mg 200 mL/hr over 60 Minutes Intravenous Every 12 hours 02/20/15 0848 02/23/15 0909   02/17/15 2000  ciprofloxacin (CIPRO) IVPB 200 mg  Status:  Discontinued     200 mg 100 mL/hr over 60 Minutes Intravenous Every 12 hours 02/17/15 1911 02/20/15 0848   02/17/15 1915  metroNIDAZOLE (FLAGYL) IVPB 500 mg  Status:  Discontinued     500 mg 100 mL/hr over 60 Minutes Intravenous Every 8 hours 02/17/15 1911 02/23/15 [id number]       Assessment/Plan Postoperative bile leak at cystic duct remnant/stump. s/p open chole Dr. Lovell SheehanJenkins, s/p JP drain removal in office -There is not enough fluid in the abdomen for interventional radiology to place a drain. -Output has greatly diminished -GI holding off on ERCP in light of lexiscan.  Tolerating diet advancement. No indications for surgical washout at this point -If pain/nausea returns and output increases with the advancement of diet then ERCP/perc drain should be reconsidered -Continue Cipro and Flagyl Day #7/7 -WOC following to help with fistula pouch to control drainage, protect skin, and quantitate drainage, may need HH services to teach how to change bag.  She will need to record output. -Follow up with Dr. Lovell Sheehan at discharge  History myocardial infarction, stent placed in mid right RCA 2012 -myoview results noted and cards note seen as well.  -OP follow up per cards  Systemic lupus Anxiety  Hypertension uncontrolled Peptic ulcer disease.  Status post open cholecystectomy Status post abdominal hysterectomy.  LOS: 6 days    Nonie Hoyer 02/23/2015, 10:32 AM Pager: [phone]

## 2015-02-23 NOTE — Progress Notes (Addendum)
SUBJECTIVE:  No further CP  OBJECTIVE:   Vitals:   Filed Vitals:   02/22/15 0508 02/22/15 1000 02/22/15 2100 02/23/15 0456  BP: 131/90 154/81 139/59 140/71  Pulse: 77 85 84 72  Temp: 98 F (36.7 C) 98.9 F (37.2 C) 98.7 F (37.1 C) 98.4 F (36.9 C)  TempSrc: Oral Oral Oral Oral  Resp: 16 18 17 16   Height:      Weight:   137 lb 5.6 oz (62.3 kg)   SpO2: 99% 98% 98% 98%   I&O's:   Intake/Output Summary (Last 24 hours) at 02/23/15 0935 Last data filed at 02/23/15 0900  Gross per 24 hour  Intake   3610 ml  Output   1876 ml  Net   1734 ml   TELEMETRY: Reviewed telemetry pt in NSR:     PHYSICAL EXAM General: Well developed, well nourished, in no acute distress Head: Eyes PERRLA, No xanthomas.   Normal cephalic and atramatic  Lungs:   Clear bilaterally to auscultation and percussion. Heart:   HRRR S1 S2 Pulses are 2+ & equal. Abdomen: Bowel sounds are positive, abdomen soft and non-tender without masses Extremities:   No clubbing, cyanosis or edema.  DP +1 Neuro: Alert and oriented X 3. Psych:  Good affect, responds appropriately   LABS: Basic Metabolic Panel:  Recent Labs  16/10/96 0520  02/22/15 1910 02/23/15 0550  NA 134*  --   --  137  K 2.5*  < > 2.8* 2.5*  CL 98*  --   --  100*  CO2 29  --   --  30  GLUCOSE 119*  --   --  128*  BUN <5*  --   --  <5*  CREATININE 0.57  --   --  0.52  CALCIUM 8.8*  --   --  8.4*  MG 1.5*  --   --  2.9*  < > = values in this interval not displayed. Liver Function Tests:  Recent Labs  02/21/15 0555 02/22/15 0520  AST 20 17  ALT 27 21  ALKPHOS 90 87  BILITOT 0.6 0.7  PROT 6.0* 5.7*  ALBUMIN 2.7* 2.6*   No results for input(s): LIPASE, AMYLASE in the last 72 hours. CBC:  Recent Labs  02/22/15 0520  WBC 6.1  NEUTROABS 3.0  HGB 9.9*  HCT 30.5*  MCV 90.8  PLT 377   Cardiac Enzymes: No results for input(s): CKTOTAL, CKMB, CKMBINDEX, TROPONINI in the last 72 hours. BNP: Invalid input(s):  POCBNP D-Dimer: No results for input(s): DDIMER in the last 72 hours. Hemoglobin A1C: No results for input(s): HGBA1C in the last 72 hours. Fasting Lipid Panel: No results for input(s): CHOL, HDL, LDLCALC, TRIG, CHOLHDL, LDLDIRECT in the last 72 hours. Thyroid Function Tests: No results for input(s): TSH, T4TOTAL, T3FREE, THYROIDAB in the last 72 hours.  Invalid input(s): FREET3 Anemia Panel: No results for input(s): VITAMINB12, FOLATE, FERRITIN, TIBC, IRON, RETICCTPCT in the last 72 hours. Coag Panel:   No results found for: INR, PROTIME  RADIOLOGY: Ct Abdomen Pelvis Wo Contrast  02/17/2015  CLINICAL DATA:  Recent cholecystectomy for gangrenous cholecystitis. Right upper quadrant abdominal pain. EXAM: CT ABDOMEN AND PELVIS WITHOUT CONTRAST TECHNIQUE: Multidetector CT imaging of the abdomen and pelvis was performed following the standard protocol without IV contrast. COMPARISON:  01/25/2015 FINDINGS: Lower chest: Patchy parenchymal densities in the left lower lobe are concerning for a small focus of aspiration or pneumonia. Evidence for coronary artery calcifications. Hepatobiliary: There is a  nonmetallic biliary stent with the distal aspect in the duodenum. Cholecystectomy clips are noted. Vague low-density in the gallbladder fossa is suggestive for fluid. This fluid tracks anterior towards the abdominal wall. Tiny focus of gas near the gallbladder fossa. Three small calcific foci between the liver and right kidney upper pole are concerning for retained gallstones. These small calcifications are grouped together and roughly measure up to 1 cm in size. Inflammatory changes along the right upper anterior abdominal wall related to recent surgery. Limited evaluation of the liver and gallbladder fossa without intravenous contrast. Pancreas: Normal appearance of the pancreas without duct dilatation or inflammation. Spleen: No acute abnormality to the spleen. Adrenals/Urinary Tract: Normal appearance of  the adrenal glands. Normal appearance of both kidneys without stones or hydronephrosis. Normal appearance of the urinary bladder. Stomach/Bowel: Moderate stool burden throughout the colon without acute colonic inflammation. Normal appearance of small bowel without obstruction. There may be mild inflammation of the proximal duodenum adjacent to the liver. Vascular/Lymphatic: Atherosclerotic calcifications in the aorta and iliac arteries without aneurysm. No significant abdominal or pelvic lymphadenopathy. Reproductive: Uterus is surgically absent. Other: No significant free fluid. Musculoskeletal: No suspicious bone lesions. IMPRESSION: Post cholecystectomy. There is a small amount of fluid and gas in the gallbladder fossa with expected inflammatory changes from recent surgery. Nonmetallic biliary stent. Small calcifications between the liver and right kidney are suggestive for retained gallstones. Patchy densities in the left lower lobe are concerning for a small focus of pneumonia or aspiration pneumonitis. Electronically Signed   By: Richarda OverlieAdam  Henn M.D.   On: 02/17/2015 17:26   Dg Chest 2 View  02/17/2015  CLINICAL DATA:  Fever, chest pain, shortness of breath. EXAM: CHEST  2 VIEW COMPARISON:  January 25, 2015. FINDINGS: The heart size and mediastinal contours are within normal limits. Both lungs are clear. No pneumothorax or pleural effusion is noted. The visualized skeletal structures are unremarkable. IMPRESSION: No active cardiopulmonary disease. Electronically Signed   By: Lupita RaiderJames  Green Jr, M.D.   On: 02/17/2015 15:50   X-ray Chest Pa And Lateral  01/25/2015  CLINICAL DATA:  Pt states she has been having stomach pain onset for 4 days. Pt states she has been vomiting and feeling nauseous. EXAM: CHEST  2 VIEW COMPARISON:  03/20/2014 FINDINGS: Cardiac silhouette is borderline enlarged. No mediastinal or hilar masses or pathologically enlarged lymph nodes. There is a right coronary artery stent. Clear lungs.  No  pleural effusion or pneumothorax. Bony thorax is intact. IMPRESSION: No acute cardiopulmonary disease. Electronically Signed   By: Amie Portlandavid  Ormond M.D.   On: 01/25/2015 12:40   Nm Hepatobiliary Including Gb  02/18/2015  CLINICAL DATA:  Status post cholecystectomy, evaluate for bile leak EXAM: NUCLEAR MEDICINE HEPATOBILIARY IMAGING TECHNIQUE: Sequential images of the abdomen were obtained out to 60 minutes following intravenous administration of radiopharmaceutical. RADIOPHARMACEUTICALS:  5.3 mCi Tc-3169m  Choletec IV COMPARISON:  CT abdomen pelvis dated 02/17/2015 FINDINGS: Normal hepatic excretion. Within 15 minutes, radiotracer begins to pool along the inferior liver margin. This is clearly extraluminal and reflects a bile leak. Study was aborted after 45 minutes. IMPRESSION: Study is positive for bile leak. These results will be called to the ordering clinician or representative by the Radiologist Assistant, and communication documented in the PACS or zVision Dashboard. Electronically Signed   By: Charline BillsSriyesh  Krishnan M.D.   On: 02/18/2015 14:06   Ct Abdomen Pelvis W Contrast  01/25/2015  CLINICAL DATA:  Diffuse abdominal pain.  Fever and diarrhea EXAM: CT ABDOMEN  AND PELVIS WITH CONTRAST TECHNIQUE: Multidetector CT imaging of the abdomen and pelvis was performed using the standard protocol following bolus administration of intravenous contrast. CONTRAST:  50mL OMNIPAQUE IOHEXOL 300 MG/ML SOLN, OMNIPAQUE IOHEXOL 300 MG/ML SOLN COMPARISON:  None. FINDINGS: Lower chest: Minimal bibasilar atelectasis. No infiltrate or effusion in the lung bases. Heart size upper normal. Hepatobiliary: Gallbladder is distended with gallbladder wall thickening. Mild stranding in the pericholecystic fat. There is a small gas bubble in the fundus of the gallbladder. Multiple calcified gallstones are present. There is gas in the cystic duct and common bile duct. The common bile duct is nondilated. Anterior to the cystic duct in the  porta hepatis there is a multilocular cyst measuring 33 mm in diameter. No additional liver lesions identified. No free air no free fluid. Pancreas: Normal pancreas. Negative for pancreatitis or pancreatic mass. Spleen: Negative Adrenals/Urinary Tract: Negative for renal obstruction. No renal mass or renal calculi. Urinary bladder normal. Stomach/Bowel: Negative for bowel obstruction. Small hiatal hernia. Negative for bowel edema. Appendix not visualized. Vascular/Lymphatic: Atherosclerotic calcification in the aorta and iliac arteries. Negative for aortic aneurysm. Reproductive: Hysterectomy.  Negative for pelvic mass. Other: No free fluid.  No free air.  Negative for adenopathy. Musculoskeletal: Minimal lumbar degenerative change. No acute skeletal abnormality. IMPRESSION: Cholelithiasis. Distended gallbladder with gallbladder wall thickening and mild pericholecystic edema. Findings suggest acute cholecystitis. In addition, there is gas in the gallbladder lumen as well as in the cystic duct and common bile duct compatible with cholecystitis. 33 mm multilocular cyst in the porta hepatis. This may represent a biliary cystadenoma or septated hepatic cyst. Appendix not visualized. Electronically Signed   By: Marlan Palau M.D.   On: 01/25/2015 07:17   Mr 3d Recon At Scanner  02/18/2015  CLINICAL DATA:  Status post laparoscopic cholecystectomy on 01/27/2015, bile leak via surgical wound, pain/tenderness EXAM: MRI ABDOMEN WITHOUT AND WITH CONTRAST (INCLUDING MRCP) TECHNIQUE: Multiplanar multisequence MR imaging of the abdomen was performed both before and after the administration of intravenous contrast. Heavily T2-weighted images of the biliary and pancreatic ducts were obtained, and three-dimensional MRCP images were rendered by post processing. CONTRAST:  12mL MULTIHANCE GADOBENATE DIMEGLUMINE 529 MG/ML IV SOLN COMPARISON:  Nuclear medicine hepatobiliary scan dated 02/18/2015. CT abdomen pelvis dated 02/09/2015.  ERCP dated 02/08/2015. FINDINGS: Lower chest: Mild patchy opacity at the left lung base (series 7/image 6). Cardiomegaly. Hepatobiliary: Liver is notable for mild hepatic steatosis. No suspicious/enhancing hepatic lesions. Mild increased perfusion along the gallbladder fossa. Status post cholecystectomy. No intrahepatic or extrahepatic ductal dilatation. Common duct measures 5 mm (series 6/image 24). No choledocholithiasis is seen. 1.6 x 2.6 cm complex fluid collection with layering fluid-fluid level in the gallbladder fossa (series 8/image 21), likely reflecting a biloma. This collection is adjacent to susceptibility artifact from a surgical clips (series 8/image 20), likely corresponding to the known bile leak at the cystic duct remnant/stump. Direct drainage of the collection to the skin surface via a catheter tract along the right anterior abdomen and exiting the right lateral abdominal wall (series 8/images 24, 27, and 35). Pancreas: Within normal limits. Spleen: Within normal limits. Adrenals/Urinary Tract: Adrenal glands are within normal limits. Kidneys within normal limits.  No hydronephrosis. Stomach/Bowel: Stomach is within normal limits. Visualized bowel is unremarkable. Vascular/Lymphatic: No evidence of abdominal aortic aneurysm. No suspicious abdominal lymphadenopathy. Other: No abdominal ascites. Musculoskeletal: No focal osseous lesions. IMPRESSION: Status post cholecystectomy. No intrahepatic or extrahepatic ductal dilatation. Common duct measures 5 mm. No choledocholithiasis is  seen. 1.6 x 2.6 cm complex fluid collection/biloma in the gallbladder fossa, likely corresponding to the known bile leak at the cystic duct remnant/stump. Drug drainage of the collection to the skin surface deviated a catheter tract along the right anterior abdomen and exiting the right lateral abdominal wall. Additional ancillary findings as above. Electronically Signed   By: Charline Bills M.D.   On: 02/18/2015 19:43    Nm Myocar Multi W/spect W/wall Motion / Ef  02/20/2015  CLINICAL DATA:  Chest pain.  Coronary artery disease. EXAM: MYOCARDIAL IMAGING WITH SPECT (REST AND PHARMACOLOGIC-STRESS) GATED LEFT VENTRICULAR WALL MOTION STUDY LEFT VENTRICULAR EJECTION FRACTION TECHNIQUE: Standard myocardial SPECT imaging was performed after resting intravenous injection of 10 mCi Tc-21m sestamibi. Subsequently, intravenous infusion of Lexiscan was performed under the supervision of the Cardiology staff. At peak effect of the drug, 30 mCi Tc-27m sestamibi was injected intravenously and standard myocardial SPECT imaging was performed. Quantitative gated imaging was also performed to evaluate left ventricular wall motion, and estimate left ventricular ejection fraction. COMPARISON:  03/21/2014 FINDINGS: Perfusion: 2 reversible defects involving the lateral wall. The smaller defect is located along the anterior aspect of the lateral wall near the apex and the other slightly larger defect is located in the midportion of the lateral wall posteriorly. Wall Motion: Lateral wall motion abnormalities. The remaining walls of the left ventricle demonstrate normal wall motion and thickening with contraction. Left Ventricular Ejection Fraction: 70 % End diastolic volume 84 ml End systolic volume 25 ml IMPRESSION: 1. Two reversible defects involving the lateral wall consistent with areas of ischemia. 2. Lateral wall motion abnormality. 3. Left ventricular ejection fraction 70% 4. High-risk stress test findings*. *2012 Appropriate Use Criteria for Coronary Revascularization Focused Update: J Am Coll Cardiol. 2012;59(9):857-881. http://content.dementiazones.com.aspx?articleid=[email or web address] These results will be called to the ordering clinician or representative by the Radiologist Assistant, and communication documented in the PACS or zVision Dashboard. Electronically Signed   By: Rudie Meyer M.D.   On: 02/20/2015 15:36   Dg Ercp Biliary &  Pancreatic Ducts  02/08/2015  CLINICAL DATA:  Sphincterotomy.  Biliary stent placement. EXAM: ERCP TECHNIQUE: Multiple spot images obtained with the fluoroscopic device and submitted for interpretation post-procedure. COMPARISON:  CT abdomen and pelvis- 01/25/2015; abdominal ultrasound - 01/25/2015 FINDINGS: Three spot intraoperative images during ERCP are provided for review. Initial image demonstrates an ERCP probe overlying the right upper abdominal quadrant. Surgical clips overlie the expected location of the gallbladder fossa. A surgical drain also overlies the expected location of the gallbladder fossa. Subsequent images demonstrate selective cannulation opacification of the CBD. There is apparent extravasation of injected contrast adjacent to the cholecystectomy clips as well as the cranial end of the surgically placed drain. There is no definitive opacification of the cystic duct though evaluation is degraded secondary to patient respiratory artifact. Completion image demonstrates placement of a internal plastic biliary stent overlying expected location of the CBD. IMPRESSION: 1. ERCP with biliary stent placement as above. 2. Contrast injection demonstrates extravasation of contrast about the cholecystectomy clips as well as the cranial aspect of the surgically placed drain indicative of a biliary leak. The cystic duct was not definitely identified though evaluation is degraded secondary to patient respiration. Correlation with the operative report is recommended. These images were submitted for radiologic interpretation only. Please see the procedural report for the amount of contrast and the fluoroscopy time utilized. Electronically Signed   By: Simonne Come M.D.   On: 02/08/2015 14:56   Mr Abd W/wo Cm/mrcp  02/18/2015  CLINICAL DATA:  Status post laparoscopic cholecystectomy on 01/27/2015, bile leak via surgical wound, pain/tenderness EXAM: MRI ABDOMEN WITHOUT AND WITH CONTRAST (INCLUDING MRCP)  TECHNIQUE: Multiplanar multisequence MR imaging of the abdomen was performed both before and after the administration of intravenous contrast. Heavily T2-weighted images of the biliary and pancreatic ducts were obtained, and three-dimensional MRCP images were rendered by post processing. CONTRAST:  12mL MULTIHANCE GADOBENATE DIMEGLUMINE 529 MG/ML IV SOLN COMPARISON:  Nuclear medicine hepatobiliary scan dated 02/18/2015. CT abdomen pelvis dated 02/09/2015. ERCP dated 02/08/2015. FINDINGS: Lower chest: Mild patchy opacity at the left lung base (series 7/image 6). Cardiomegaly. Hepatobiliary: Liver is notable for mild hepatic steatosis. No suspicious/enhancing hepatic lesions. Mild increased perfusion along the gallbladder fossa. Status post cholecystectomy. No intrahepatic or extrahepatic ductal dilatation. Common duct measures 5 mm (series 6/image 24). No choledocholithiasis is seen. 1.6 x 2.6 cm complex fluid collection with layering fluid-fluid level in the gallbladder fossa (series 8/image 21), likely reflecting a biloma. This collection is adjacent to susceptibility artifact from a surgical clips (series 8/image 20), likely corresponding to the known bile leak at the cystic duct remnant/stump. Direct drainage of the collection to the skin surface via a catheter tract along the right anterior abdomen and exiting the right lateral abdominal wall (series 8/images 24, 27, and 35). Pancreas: Within normal limits. Spleen: Within normal limits. Adrenals/Urinary Tract: Adrenal glands are within normal limits. Kidneys within normal limits.  No hydronephrosis. Stomach/Bowel: Stomach is within normal limits. Visualized bowel is unremarkable. Vascular/Lymphatic: No evidence of abdominal aortic aneurysm. No suspicious abdominal lymphadenopathy. Other: No abdominal ascites. Musculoskeletal: No focal osseous lesions. IMPRESSION: Status post cholecystectomy. No intrahepatic or extrahepatic ductal dilatation. Common duct measures  5 mm. No choledocholithiasis is seen. 1.6 x 2.6 cm complex fluid collection/biloma in the gallbladder fossa, likely corresponding to the known bile leak at the cystic duct remnant/stump. Drug drainage of the collection to the skin surface deviated a catheter tract along the right anterior abdomen and exiting the right lateral abdominal wall. Additional ancillary findings as above. Electronically Signed   By: Charline Bills M.D.   On: 02/18/2015 19:43   US Abdomen Limited Ruq  01/25/2015  CLINICAL DATA:  59 year old with acute onset of upper abdominal pain associated with nausea, vomiting and diarrhea which began 3 days ago. EXAM: US ABDOMEN LIMITED - RIGHT UPPER QUADRANT COMPARISON:  CT abdomen and pelvis performed earlier same date. FINDINGS: Gallbladder: Numerous shadowing gallstones and echogenic sludge. Gallbladder wall thickening, likely overestimated on ultrasound due to the gas within the gallbladder lumen as noted on the earlier CT. Positive sonographic Murphy sign according to the ultrasound technologist. Common bile duct: Diameter: Approximately 5 mm diameter. Gas within the common bile duct as noted on the CT. Liver: Multilocular fluid collection centrally in the liver as noted on CT measuring approximately 3.3 x 2.1 cm. No other focal hepatic parenchymal abnormality. Patent portal vein with antegrade flow. IMPRESSION: 1. Cholelithiasis and acute cholecystitis. Gas within the gallbladder fundus and the nondistended common bile duct as noted on earlier CT, likely due to a gas producing organism. Less likely, the gas could arise due to a fistula with adjacent bowel. 2. Multilocular fluid collection centrally in the liver as noted on earlier CT, likely a liver abscess, given its proximity to the infected gallbladder. Electronically Signed   By: Hulan Saas M.D.   On: 01/25/2015 07:59    ASSESSMENT/PLAN: 1. Abnormal EKG - EKGs reviewed as far back as 02/2014 which show a  similar appearance to  EKG done on admission. These show NSR with LVH and repolarization abnormality.  2. 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 This could be due to underlying gastrointestinal issues but also could represent underlying coronary ischemia in the setting of acute stress from underlying illness. Troponin is negative. She did well without any cardiac complications during recent cholecystectomy 01/27/2015. Nuc med stress test in setting of similar appearing EKG 02/2014 showed no ischemia and normal LVF. Norberta Keens this admission showed 2 reversible defects involving the lateral wall. The smaller defect is located along the anterior aspect of the lateral wall near the apex and the other slightly larger defect is located in the midportion of the lateral wall posteriorly. I have discussed case with interventional colleagues. Difficult situation in that if she has obstructive CAD requiring PCI, she would need to be on DAPT therapy for at least a month post cath prior to being able to hold antiplatelet therapy for any procedures/surgery. Imdur added for antianginal therapy.GI feels no ERCP needed at this time due to  patient's clinical improvement.I think we need to get patient recovered from GI issues and then bring back as an outpt for cardiac cath to reevaluate coronary anatomy.   3. Gangrenous GB s/p cholecystectomy now with biliary leak which appears to have slowed down - per GI  4. Abdominal pain, nausea and vomiting secondary to #3 - improved 5. Acute renal failure secondary to dehydration and volume depletion. This is resolved. 6. HTN - improved on BB/clonidine/amlodipine 7. Hypokalemia - replete per hospitalist  No new recs.  No cath for now. CP  resolved with long acting nitrates and no further GI procedures planned at this time.  Ok to d/c home from cardiac standpoint with early followup with Dr. Diona Browner to determine if cath should be pursued or continue medical therapy.  We will arrange this.  Call with any question.  Will sign off.    Quintella Reichert, MD  02/23/2015  9:35 AM

## 2015-03-03 ENCOUNTER — Encounter: Payer: Medicare Other | Admitting: Adult Health

## 2015-03-07 ENCOUNTER — Encounter: Payer: Self-pay | Admitting: Adult Health

## 2015-03-07 ENCOUNTER — Ambulatory Visit (INDEPENDENT_AMBULATORY_CARE_PROVIDER_SITE_OTHER): Payer: Medicare Other | Admitting: Adult Health

## 2015-03-07 VITALS — BP 230/138 | HR 84 | Ht 65.0 in | Wt 122.8 lb

## 2015-03-07 DIAGNOSIS — I2510 Atherosclerotic heart disease of native coronary artery without angina pectoris: Secondary | ICD-10-CM | POA: Diagnosis not present

## 2015-03-07 DIAGNOSIS — I10 Essential (primary) hypertension: Secondary | ICD-10-CM

## 2015-03-07 DIAGNOSIS — R1010 Upper abdominal pain, unspecified: Secondary | ICD-10-CM | POA: Diagnosis not present

## 2015-03-07 MED ORDER — AMLODIPINE BESYLATE 10 MG PO TABS: 10.0000 mg | ORAL_TABLET | Freq: Every day | ORAL | Status: DC

## 2015-03-07 MED ORDER — CLONIDINE HCL 0.2 MG PO TABS: 0.2000 mg | ORAL_TABLET | Freq: Two times a day (BID) | ORAL | Status: DC

## 2015-03-07 NOTE — Progress Notes (Deleted)
Name: Sheila [name]    DOB: 1956-01-03  Age: 59 y.o.  MR#: [id number]       PCP:  [name], MD      Insurance: Payor: MEDICARE / Plan: MEDICARE PART A AND B / Product Type: *No Product type* /   CC:   No chief complaint on file.   VS Filed Vitals:   03/07/15 1406  BP: 230/138  Pulse: 84  Height:  (1.651 m)  Weight: 122 lb 12.8 oz (55.702 kg)  SpO2: 99%    Weights Current Weight  03/07/15 122 lb 12.8 oz (55.702 kg)  02/22/15 137 lb 5.6 oz (62.3 kg)  02/08/15 141 lb (63.957 kg)    Blood Pressure  BP Readings from Last 3 Encounters:  03/07/15 230/138  02/23/15 138/70  02/08/15 177/52     Admit date:  (Not on file) Last encounter with RMR:  Visit date not found   Allergy Hydrocodone-acetaminophen  Current Outpatient Prescriptions  Medication Sig Dispense Refill  . amLODipine (NORVASC) 10 MG tablet Take 1 tablet (10 mg total) by mouth daily. 30 tablet 0  . cloNIDine (CATAPRES) 0.2 MG tablet Take 1 tablet (0.2 mg total) by mouth 2 (two) times daily. 60 tablet 1  . feeding supplement, ENSURE ENLIVE, (ENSURE ENLIVE) LIQD Take 237 mLs by mouth 3 (three) times daily between meals. 237 mL 12  . isosorbide mononitrate (IMDUR) 30 MG 24 hr tablet Take 1 tablet (30 mg total) by mouth daily. 30 tablet 0  . metoprolol (LOPRESSOR) 50 MG tablet Take 50 mg by mouth 3 (three) times daily.    . Multiple Vitamins-Calcium (ONE-A-DAY WOMENS FORMULA PO) Take 1 tablet by mouth daily.    Marland Kitchen omeprazole (PRILOSEC) 40 MG capsule Take 40 mg by mouth daily.     No current facility-administered medications for this visit.    Discontinued Meds:    Medications Discontinued During This Encounter  Medication Reason  . ondansetron (ZOFRAN) 4 MG tablet Error  . diphenhydramine-acetaminophen (TYLENOL PM) 25-500 MG TABS tablet Error  . escitalopram (LEXAPRO) 10 MG tablet Error  . Multiple Vitamins-Minerals (HAIR/SKIN/NAILS PO) Error  . docusate sodium (COLACE) 100 MG capsule Error    Patient  Active Problem List   Diagnosis Date Noted  . Hypokalemia 02/23/2015  . Pain in the chest   . Protein-calorie malnutrition, severe 02/19/2015  . Preoperative cardiovascular examination 02/19/2015  . Right upper quadrant pain   . Acute kidney injury (HCC) 02/17/2015  . Acute renal failure (ARF) (HCC) 02/17/2015  . Hypertension 02/17/2015  . Elevated LFTs 02/17/2015  . Elevated lipase 02/17/2015  . Bile leak, postoperative   . Biliary anastomotic leak 02/07/2015  . Acute cholecystitis 01/25/2015  . Liver abscess 01/25/2015  . CAD (coronary artery disease) 01/25/2015  . HTN (hypertension) 01/25/2015  . AKI (acute kidney injury) (HCC) 01/25/2015  . Dehydration 01/25/2015  . Chest pain 03/21/2014  . Accelerated hypertension 03/21/2014  . History of lupus 03/21/2014  . Vasovagal syncope     LABS    Component Value Date/Time   NA 137 02/23/2015 0550   NA 134* 02/22/2015 0520   NA 134* 02/21/2015 0555   K 3.7 02/23/2015 1525   K 2.5* 02/23/2015 0550   K 2.8* 02/22/2015 1910   CL 100* 02/23/2015 0550   CL 98* 02/22/2015 0520   CL 100* 02/21/2015 0555   CO2 30 02/23/2015 0550   CO2 29 02/22/2015 0520   CO2 26 02/21/2015 0555   GLUCOSE 128* 02/23/2015 0550  GLUCOSE 119* 02/22/2015 0520   GLUCOSE 115* 02/21/2015 0555   BUN <5* 02/23/2015 0550   BUN <5* 02/22/2015 0520   BUN <5* 02/21/2015 0555   CREATININE 0.52 02/23/2015 0550   CREATININE 0.57 02/22/2015 0520   CREATININE 0.59 02/21/2015 0555   CALCIUM 8.4* 02/23/2015 0550   CALCIUM 8.8* 02/22/2015 0520   CALCIUM 8.3* 02/21/2015 0555   GFRNONAA >60 02/23/2015 0550   GFRNONAA >60 02/22/2015 0520   GFRNONAA >60 02/21/2015 0555   GFRAA >60 02/23/2015 0550   GFRAA >60 02/22/2015 0520   GFRAA >60 02/21/2015 0555   CMP     Component Value Date/Time   NA 137 02/23/2015 0550   K 3.7 02/23/2015 1525   CL 100* 02/23/2015 0550   CO2 30 02/23/2015 0550   GLUCOSE 128* 02/23/2015 0550   BUN <5* 02/23/2015 0550    CREATININE 0.52 02/23/2015 0550   CALCIUM 8.4* 02/23/2015 0550   PROT 5.7* 02/22/2015 0520   ALBUMIN 2.6* 02/22/2015 0520   AST 17 02/22/2015 0520   ALT 21 02/22/2015 0520   ALKPHOS 87 02/22/2015 0520   BILITOT 0.7 02/22/2015 0520   GFRNONAA >60 02/23/2015 0550   GFRAA >60 02/23/2015 0550       Component Value Date/Time   WBC 6.1 02/22/2015 0520   WBC 9.1 02/20/2015 0505   WBC 10.1 02/19/2015 0832   HGB 9.9* 02/22/2015 0520   HGB 9.5* 02/20/2015 0505   HGB 10.2* 02/19/2015 0832   HCT 30.5* 02/22/2015 0520   HCT 29.1* 02/20/2015 0505   HCT 32.1* 02/19/2015 0832   MCV 90.8 02/22/2015 0520   MCV 89.8 02/20/2015 0505   MCV 90.2 02/19/2015 0832    Lipid Panel     Component Value Date/Time   CHOL 227* 03/21/2014 0450   TRIG 98 03/21/2014 0450   HDL 62 03/21/2014 0450   CHOLHDL 3.7 03/21/2014 0450   VLDL 20 03/21/2014 0450   LDLCALC 145* 03/21/2014 0450    ABG No results found for: PHART, PCO2ART, PO2ART, HCO3, TCO2, ACIDBASEDEF, O2SAT   No results found for: TSH BNP (last 3 results) No results for input(s): BNP in the last 8760 hours.  ProBNP (last 3 results) No results for input(s): PROBNP in the last 8760 hours.  Cardiac Panel (last 3 results) No results for input(s): CKTOTAL, CKMB, TROPONINI, RELINDX in the last 72 hours.  Iron/TIBC/Ferritin/ %Sat No results found for: IRON, TIBC, FERRITIN, IRONPCTSAT   EKG Orders placed or performed during the hospital encounter of 02/17/15  . ED EKG  . ED EKG  . EKG 12-Lead  . EKG 12-Lead  . EKG 12-Lead  . EKG 12-Lead  . EKG 12-Lead  . EKG 12-Lead  . EKG     Prior Assessment and Plan Problem List as of 03/07/2015      Cardiovascular and Mediastinum   Accelerated hypertension   Vasovagal syncope   CAD (coronary artery disease)   HTN (hypertension)   Hypertension     Digestive   Acute cholecystitis   Liver abscess   Biliary anastomotic leak   Last Assessment & Plan 02/07/2015 Office Visit Written 02/07/2015   3:28 PM by Tiffany KocherLeslie S Lewis, PA-C    59 y/o female status post open cholecystectomy on 01/27/2015 for acute cholecystitis with cholelithiasis. Gangrenous gallbladder noted. JP drained placed. Also noted to have empyema. Presents now with ongoing biliousoutput via JP drain 11 days postop. Patient will require ERCP with biliary stenting. The risks, benefits, limitations, alternatives, and imponderables have been  reviewed with the patient. I specifically discussed a 1 in 10 chance of pancreatitis, reaction to medications, bleeding, perforation and the possibility of a failed ERCP. Need for sphincterotomy and stent placement also reviewed. Questions have been answered. All parties agreeable. Plan for procedure for tomorrow.   She will require ERCP with stent removal in 6-8 weeks, patient aware.   Patient with some drainage at site of the open cholecystectomy incision, staples removed today by Dr. Lovell Sheehan. Dressing applied. No antibiotics advised at this time.  Questionable liver abscess via CT, empyema noted intraoperatively per Dr. Lovell Sheehan. ERCP at this time. Cannot rule out need for further imaging down the road.      Bile leak, postoperative     Genitourinary   AKI (acute kidney injury) (HCC)   Acute kidney injury (HCC)   Acute renal failure (ARF) (HCC)     Other   Chest pain   History of lupus   Dehydration   Elevated LFTs   Elevated lipase   Protein-calorie malnutrition, severe   Right upper quadrant pain   Preoperative cardiovascular examination   Pain in the chest   Hypokalemia       Imaging: Ct Abdomen Pelvis Wo Contrast  02/17/2015  CLINICAL DATA:  Recent cholecystectomy for gangrenous cholecystitis. Right upper quadrant abdominal pain. EXAM: CT ABDOMEN AND PELVIS WITHOUT CONTRAST TECHNIQUE: Multidetector CT imaging of the abdomen and pelvis was performed following the standard protocol without IV contrast. COMPARISON:  01/25/2015 FINDINGS: Lower chest: Patchy parenchymal  densities in the left lower lobe are concerning for a small focus of aspiration or pneumonia. Evidence for coronary artery calcifications. Hepatobiliary: There is a nonmetallic biliary stent with the distal aspect in the duodenum. Cholecystectomy clips are noted. Vague low-density in the gallbladder fossa is suggestive for fluid. This fluid tracks anterior towards the abdominal wall. Tiny focus of gas near the gallbladder fossa. Three small calcific foci between the liver and right kidney upper pole are concerning for retained gallstones. These small calcifications are grouped together and roughly measure up to 1 cm in size. Inflammatory changes along the right upper anterior abdominal wall related to recent surgery. Limited evaluation of the liver and gallbladder fossa without intravenous contrast. Pancreas: Normal appearance of the pancreas without duct dilatation or inflammation. Spleen: No acute abnormality to the spleen. Adrenals/Urinary Tract: Normal appearance of the adrenal glands. Normal appearance of both kidneys without stones or hydronephrosis. Normal appearance of the urinary bladder. Stomach/Bowel: Moderate stool burden throughout the colon without acute colonic inflammation. Normal appearance of small bowel without obstruction. There may be mild inflammation of the proximal duodenum adjacent to the liver. Vascular/Lymphatic: Atherosclerotic calcifications in the aorta and iliac arteries without aneurysm. No significant abdominal or pelvic lymphadenopathy. Reproductive: Uterus is surgically absent. Other: No significant free fluid. Musculoskeletal: No suspicious bone lesions. IMPRESSION: Post cholecystectomy. There is a small amount of fluid and gas in the gallbladder fossa with expected inflammatory changes from recent surgery. Nonmetallic biliary stent. Small calcifications between the liver and right kidney are suggestive for retained gallstones. Patchy densities in the left lower lobe are  concerning for a small focus of pneumonia or aspiration pneumonitis. Electronically Signed   By: Richarda Overlie M.D.   On: 02/17/2015 17:26   Dg Chest 2 View  02/17/2015  CLINICAL DATA:  Fever, chest pain, shortness of breath. EXAM: CHEST  2 VIEW COMPARISON:  January 25, 2015. FINDINGS: The heart size and mediastinal contours are within normal limits. Both lungs are clear. No pneumothorax  or pleural effusion is noted. The visualized skeletal structures are unremarkable. IMPRESSION: No active cardiopulmonary disease. Electronically Signed   By: Lupita Raider, M.D.   On: 02/17/2015 15:50   Nm Hepatobiliary Including Gb  02/18/2015  CLINICAL DATA:  Status post cholecystectomy, evaluate for bile leak EXAM: NUCLEAR MEDICINE HEPATOBILIARY IMAGING TECHNIQUE: Sequential images of the abdomen were obtained out to 60 minutes following intravenous administration of radiopharmaceutical. RADIOPHARMACEUTICALS:  5.3 mCi Tc-74m  Choletec IV COMPARISON:  CT abdomen pelvis dated 02/17/2015 FINDINGS: Normal hepatic excretion. Within 15 minutes, radiotracer begins to pool along the inferior liver margin. This is clearly extraluminal and reflects a bile leak. Study was aborted after 45 minutes. IMPRESSION: Study is positive for bile leak. These results will be called to the ordering clinician or representative by the Radiologist Assistant, and communication documented in the PACS or zVision Dashboard. Electronically Signed   By: Charline Bills M.D.   On: 02/18/2015 14:06   Mr 3d Recon At Scanner  02/18/2015  CLINICAL DATA:  Status post laparoscopic cholecystectomy on 01/27/2015, bile leak via surgical wound, pain/tenderness EXAM: MRI ABDOMEN WITHOUT AND WITH CONTRAST (INCLUDING MRCP) TECHNIQUE: Multiplanar multisequence MR imaging of the abdomen was performed both before and after the administration of intravenous contrast. Heavily T2-weighted images of the biliary and pancreatic ducts were obtained, and three-dimensional  MRCP images were rendered by post processing. CONTRAST:  12mL MULTIHANCE GADOBENATE DIMEGLUMINE 529 MG/ML IV SOLN COMPARISON:  Nuclear medicine hepatobiliary scan dated 02/18/2015. CT abdomen pelvis dated 02/09/2015. ERCP dated 02/08/2015. FINDINGS: Lower chest: Mild patchy opacity at the left lung base (series 7/image 6). Cardiomegaly. Hepatobiliary: Liver is notable for mild hepatic steatosis. No suspicious/enhancing hepatic lesions. Mild increased perfusion along the gallbladder fossa. Status post cholecystectomy. No intrahepatic or extrahepatic ductal dilatation. Common duct measures 5 mm (series 6/image 24). No choledocholithiasis is seen. 1.6 x 2.6 cm complex fluid collection with layering fluid-fluid level in the gallbladder fossa (series 8/image 21), likely reflecting a biloma. This collection is adjacent to susceptibility artifact from a surgical clips (series 8/image 20), likely corresponding to the known bile leak at the cystic duct remnant/stump. Direct drainage of the collection to the skin surface via a catheter tract along the right anterior abdomen and exiting the right lateral abdominal wall (series 8/images 24, 27, and 35). Pancreas: Within normal limits. Spleen: Within normal limits. Adrenals/Urinary Tract: Adrenal glands are within normal limits. Kidneys within normal limits.  No hydronephrosis. Stomach/Bowel: Stomach is within normal limits. Visualized bowel is unremarkable. Vascular/Lymphatic: No evidence of abdominal aortic aneurysm. No suspicious abdominal lymphadenopathy. Other: No abdominal ascites. Musculoskeletal: No focal osseous lesions. IMPRESSION: Status post cholecystectomy. No intrahepatic or extrahepatic ductal dilatation. Common duct measures 5 mm. No choledocholithiasis is seen. 1.6 x 2.6 cm complex fluid collection/biloma in the gallbladder fossa, likely corresponding to the known bile leak at the cystic duct remnant/stump. Drug drainage of the collection to the skin surface  deviated a catheter tract along the right anterior abdomen and exiting the right lateral abdominal wall. Additional ancillary findings as above. Electronically Signed   By: Charline Bills M.D.   On: 02/18/2015 19:43   Nm Myocar Multi W/spect W/wall Motion / Ef  02/20/2015  CLINICAL DATA:  Chest pain.  Coronary artery disease. EXAM: MYOCARDIAL IMAGING WITH SPECT (REST AND PHARMACOLOGIC-STRESS) GATED LEFT VENTRICULAR WALL MOTION STUDY LEFT VENTRICULAR EJECTION FRACTION TECHNIQUE: Standard myocardial SPECT imaging was performed after resting intravenous injection of 10 mCi Tc-26m sestamibi. Subsequently, intravenous infusion of Lexiscan was performed  under the supervision of the Cardiology staff. At peak effect of the drug, 30 mCi Tc-49m sestamibi was injected intravenously and standard myocardial SPECT imaging was performed. Quantitative gated imaging was also performed to evaluate left ventricular wall motion, and estimate left ventricular ejection fraction. COMPARISON:  03/21/2014 FINDINGS: Perfusion: 2 reversible defects involving the lateral wall. The smaller defect is located along the anterior aspect of the lateral wall near the apex and the other slightly larger defect is located in the midportion of the lateral wall posteriorly. Wall Motion: Lateral wall motion abnormalities. The remaining walls of the left ventricle demonstrate normal wall motion and thickening with contraction. Left Ventricular Ejection Fraction: 70 % End diastolic volume 84 ml End systolic volume 25 ml IMPRESSION: 1. Two reversible defects involving the lateral wall consistent with areas of ischemia. 2. Lateral wall motion abnormality. 3. Left ventricular ejection fraction 70% 4. High-risk stress test findings*. *2012 Appropriate Use Criteria for Coronary Revascularization Focused Update: J Am Coll Cardiol. 2012;59(9):857-881. http://content.dementiazones.com.aspx?articleid=[email or web address] These results will be called to the ordering  clinician or representative by the Radiologist Assistant, and communication documented in the PACS or zVision Dashboard. Electronically Signed   By: Rudie Meyer M.D.   On: 02/20/2015 15:36   Dg Ercp Biliary & Pancreatic Ducts  02/08/2015  CLINICAL DATA:  Sphincterotomy.  Biliary stent placement. EXAM: ERCP TECHNIQUE: Multiple spot images obtained with the fluoroscopic device and submitted for interpretation post-procedure. COMPARISON:  CT abdomen and pelvis- 01/25/2015; abdominal ultrasound - 01/25/2015 FINDINGS: Three spot intraoperative images during ERCP are provided for review. Initial image demonstrates an ERCP probe overlying the right upper abdominal quadrant. Surgical clips overlie the expected location of the gallbladder fossa. A surgical drain also overlies the expected location of the gallbladder fossa. Subsequent images demonstrate selective cannulation opacification of the CBD. There is apparent extravasation of injected contrast adjacent to the cholecystectomy clips as well as the cranial end of the surgically placed drain. There is no definitive opacification of the cystic duct though evaluation is degraded secondary to patient respiratory artifact. Completion image demonstrates placement of a internal plastic biliary stent overlying expected location of the CBD. IMPRESSION: 1. ERCP with biliary stent placement as above. 2. Contrast injection demonstrates extravasation of contrast about the cholecystectomy clips as well as the cranial aspect of the surgically placed drain indicative of a biliary leak. The cystic duct was not definitely identified though evaluation is degraded secondary to patient respiration. Correlation with the operative report is recommended. These images were submitted for radiologic interpretation only. Please see the procedural report for the amount of contrast and the fluoroscopy time utilized. Electronically Signed   By: Simonne Come M.D.   On: 02/08/2015 14:56   Mr Abd  W/wo Cm/mrcp  02/18/2015  CLINICAL DATA:  Status post laparoscopic cholecystectomy on 01/27/2015, bile leak via surgical wound, pain/tenderness EXAM: MRI ABDOMEN WITHOUT AND WITH CONTRAST (INCLUDING MRCP) TECHNIQUE: Multiplanar multisequence MR imaging of the abdomen was performed both before and after the administration of intravenous contrast. Heavily T2-weighted images of the biliary and pancreatic ducts were obtained, and three-dimensional MRCP images were rendered by post processing. CONTRAST:  12mL MULTIHANCE GADOBENATE DIMEGLUMINE 529 MG/ML IV SOLN COMPARISON:  Nuclear medicine hepatobiliary scan dated 02/18/2015. CT abdomen pelvis dated 02/09/2015. ERCP dated 02/08/2015. FINDINGS: Lower chest: Mild patchy opacity at the left lung base (series 7/image 6). Cardiomegaly. Hepatobiliary: Liver is notable for mild hepatic steatosis. No suspicious/enhancing hepatic lesions. Mild increased perfusion along the gallbladder fossa. Status post cholecystectomy. No intrahepatic  or extrahepatic ductal dilatation. Common duct measures 5 mm (series 6/image 24). No choledocholithiasis is seen. 1.6 x 2.6 cm complex fluid collection with layering fluid-fluid level in the gallbladder fossa (series 8/image 21), likely reflecting a biloma. This collection is adjacent to susceptibility artifact from a surgical clips (series 8/image 20), likely corresponding to the known bile leak at the cystic duct remnant/stump. Direct drainage of the collection to the skin surface via a catheter tract along the right anterior abdomen and exiting the right lateral abdominal wall (series 8/images 24, 27, and 35). Pancreas: Within normal limits. Spleen: Within normal limits. Adrenals/Urinary Tract: Adrenal glands are within normal limits. Kidneys within normal limits.  No hydronephrosis. Stomach/Bowel: Stomach is within normal limits. Visualized bowel is unremarkable. Vascular/Lymphatic: No evidence of abdominal aortic aneurysm. No suspicious  abdominal lymphadenopathy. Other: No abdominal ascites. Musculoskeletal: No focal osseous lesions. IMPRESSION: Status post cholecystectomy. No intrahepatic or extrahepatic ductal dilatation. Common duct measures 5 mm. No choledocholithiasis is seen. 1.6 x 2.6 cm complex fluid collection/biloma in the gallbladder fossa, likely corresponding to the known bile leak at the cystic duct remnant/stump. Drug drainage of the collection to the skin surface deviated a catheter tract along the right anterior abdomen and exiting the right lateral abdominal wall. Additional ancillary findings as above. Electronically Signed   By: Charline Bills M.D.   On: 02/18/2015 19:43

## 2015-03-07 NOTE — Patient Instructions (Addendum)
Your physician recommends that you schedule a follow-up appointment in: 1 month with Harriet PhoK Lawrence NP   Your physician recommends that you continue on your current medications as directed. Please refer to the Current Medication list given to you today.   I refilled your medication at [facility],PLEASE PICK UP TODAY   DR ERIC GILL WILL SEE YOU TOMORROW AT 8:30 am in GI   If you need a refill on your cardiac medications before your next appointment, please call your pharmacy.      Thank you for choosing [facility] !

## 2015-03-07 NOTE — Progress Notes (Signed)
Cardiology Office Note   Date:  03/07/2015   ID:  Sheila [name], DOB [birth year], MRN [id number]  PCP:  Geraldo PitterBLAND,VEITA J, MD  Cardiologist: [place]owell/  Joni ReiningKathryn Bertrice Leder, NP   Chief Complaint  Patient presents with  . Chest Pain  . Hypertension      History of Present Illness: Sheila [name] is a 59 y.o. female who presents for ongoing assessment and management of CAD with DES to RCA in 2012, and stents to unknown arteries one year later, followed by Dr. Arthur HolmsMarion Wells, of [facility] hypertension, with admission for chest pain. 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 She [place]  [facility] recurrent chest pain with ST depression. Saw Dr. Mayford Knifeurner on 02/23/2015.  This was found to be GI in etiology. She needed to have ERCP due to bilary leak s/p cholecystectomy on 02/06/2015, but this was delayed due to abnormal EKG. . She was placed on long acting nitrates and chest pain resolved. Difficult situation if she has obstructive CAD requiring PCI, patient will need dual antiplatelet therapy for at least a month post-cath. She will follow up with her cardiologist Dr. Diona BrownerMcDowell as outpatient and will decide on cath versus medical management. She is to be followed by GI as well. Dr.Rourke, but has not made appointment yet.   She is here with a lot of abdominal pain, holding her abdomen and is also hypertensive She did not fill her clonidine Rx and hasn't taken it today. She states the pain is in the middle gastric area and is unrelenting.    Past Medical History  Diagnosis Date  . Myocardial infarction (HCC) 2012   . CVD (cardiovascular disease)     Stent to mid RCA 2012 [name] 2.5 X 12 mm  . Systemic lupus (HCC)   . Anxiety   . Hypertension   . Arthritis   . Hypercholesterolemia   . PUD (peptic ulcer disease)     Dr. in [place] in early 2016    Past Surgical History  Procedure  Laterality Date  . Coronary angioplasty with stent placement  2012, 2013.    [name] DES to mid RCA. Stents X 2 to unknown artery  . Abdominal hysterectomy    . Tubal ligation    . Breast lumpectomy    . Cholecystectomy N/A 01/27/2015    Procedure: CHOLECYSTECTOMY;  Surgeon: Franky MachoMark Jenkins, MD;  Location: [facility];  Service: General;  Laterality: N/A;  converted to open at 0941  . Esophagogastroduodenoscopy      [place]:PUD per patient  . Colonoscopy  2008    [facility], normal.   . Ercp N/A 02/08/2015    [id number]:[id number]:post-op biliary leak/s/p stent placement  . Biliary stent placement N/A 02/08/2015    Procedure: BILIARY STENT PLACEMENT;  Surgeon: Corbin Adeobert M Rourk, MD;  Location: [facility];  Service: Endoscopy;  Laterality: N/A;  . Sphincterotomy N/A 02/08/2015    Procedure: SPHINCTEROTOMY;  Surgeon: Corbin Adeobert M Rourk, MD;  Location: [facility];  Service: Endoscopy;  Laterality: N/A;     Current Outpatient Prescriptions  Medication Sig Dispense Refill  . amLODipine (NORVASC) 10 MG tablet Take 1 tablet (10 mg total) by mouth daily. 30 tablet 6  . cloNIDine (CATAPRES) 0.2 MG tablet Take 1 tablet (0.2 mg total) by mouth 2 (two) times daily. 60 tablet 6  . feeding supplement, ENSURE ENLIVE, (ENSURE ENLIVE) LIQD Take 237 mLs by mouth  3 (three) times daily between meals. 237 mL 12  . isosorbide mononitrate (IMDUR) 30 MG 24 hr tablet Take 1 tablet (30 mg total) by mouth daily. 30 tablet 0  . metoprolol (LOPRESSOR) 50 MG tablet Take 50 mg by mouth 3 (three) times daily.    . Multiple Vitamins-Calcium (ONE-A-DAY WOMENS FORMULA PO) Take 1 tablet by mouth daily.    Marland Kitchen omeprazole (PRILOSEC) 40 MG capsule Take 40 mg by mouth daily.     No current facility-administered medications for this visit.    Allergies:   Hydrocodone-acetaminophen    Social History:  The patient  reports that she has quit smoking. Her smoking use included Cigarettes. She does not have any smokeless tobacco history on file. She reports that she  does not drink alcohol or use illicit drugs.   Family History:  The patient's family history includes CAD in her father; Heart attack in her brother; Hypertension in her father and sister. There is no history of Colon cancer or Liver disease.    ROS: All other systems are reviewed and negative. Unless otherwise mentioned in H&P    PHYSICAL EXAM: VS:  BP 230/138 mmHg  Pulse 84  Ht  (1.651 m)  Wt 122 lb 12.8 oz (55.702 kg)  BMI 20.44 kg/m2  SpO2 99% , BMI Body mass index is 20.44 kg/(m^2). GEN: Well nourished, well developed, in no acute distress HEENT: normal Neck: no JVD, carotid bruits, or masses Cardiac: RRR; S4  murmur rubs, or gallops,no edema  Respiratory:  clear to auscultation bilaterally, normal work of breathing GI: soft, tender, nondistended, + BS MS: no deformity or atrophy Skin: warm and dry, no rash Neuro:  Strength and sensation are intact Psych: euthymic mood, full affect  Recent Labs: 02/22/2015: ALT 21; Hemoglobin 9.9*; Platelets 377 02/23/2015: BUN <5*; Creatinine, Ser 0.52; Magnesium 2.9*; Potassium 3.7; Sodium 137    Lipid Panel    Component Value Date/Time   CHOL 227* 03/21/2014 0450   TRIG 98 03/21/2014 0450   HDL 62 03/21/2014 0450   CHOLHDL 3.7 03/21/2014 0450   VLDL 20 03/21/2014 0450   LDLCALC 145* 03/21/2014 0450      Wt Readings from Last 3 Encounters:  03/07/15 122 lb 12.8 oz (55.702 kg)  02/22/15 137 lb 5.6 oz (62.3 kg)  02/08/15 141 lb (63.957 kg)      Other studies Reviewed: Additional studies/ records that were reviewed today include: Recent admission to [facility] with need to be seen by GI for repeat ERCP.  ASSESSMENT AND PLAN:  1.Uncontrolled hypertension: Likely having rebound hypertension since she did not take her clonidine yesterday or today. I have written a Rx for her that she should pick up on the way home. She is to continue amlodipine, metoprolol and take them as directed.   2. Abnormal Nuclear MPI: . Two reversible  defects involving the lateral wall consistent with areas of ischemia.. Lateral wall motion abnormality.She is cleared to have ERCP once BP is under control per evaluation at [facility] by Dr. Mayford Knife on note of 02/23/2015.  . We will hold off on cath until ERCP procedure is completed as she may need DAPT which will delay invasive procedure. Priority is abdominal pain control as this is severe to her and unrelenting.   3. CAD: Hx of Stent to RCA in 2012: Continue BB and nitrates.   3. Abdominal Pain: I have made appointment with Wynne Dust. NP for 8:30 in am to evaluate and make determination for need for  repeat ERCP as discussed in notes from Jorje Guild, [place] on 02/23/2015 and read by Dr. Gaynelle Adu.      Postoperative bile leak at cystic duct remnant/stump. s/p open chole Dr. Lovell Sheehan, s/p JP drain removal in office -There is not enough fluid in the abdomen for interventional radiology to place a drain. -Output has greatly diminished -GI holding off on ERCP in light of lexiscan. Tolerating diet advancement. No indications for surgical washout at this point -If pain/nausea returns and output increases with the advancement of diet then ERCP/perc drain should be reconsidered -Continue Cipro and Flagyl Day #7/7 -WOC following to help with fistula pouch to control drainage, protect skin, and quantitate drainage, may need HH services to teach how to change bag. She will need to record output. -Follow up with Dr. Lovell Sheehan at discharge   Current medicines are reviewed at length with the patient today.    Labs/ tests ordered today include: None No orders of the defined types were placed in this encounter.     Disposition:   FU with one month.   Signed, Joni Reining, NP  03/07/2015 3:59 PM    [facility] [address], [place], [place] [address] Phone: [phone]; Fax: [phone]

## 2015-03-08 ENCOUNTER — Inpatient Hospital Stay (HOSPITAL_COMMUNITY): Payer: Medicare Other

## 2015-03-08 ENCOUNTER — Inpatient Hospital Stay (HOSPITAL_COMMUNITY)
Admission: AD | Admit: 2015-03-08 | Discharge: 2015-03-12 | DRG: 383 | Disposition: A | Discharge status: Home / Self Care | Payer: Medicare Other | Source: Ambulatory Visit | Attending: Internal Medicine | Admitting: Internal Medicine

## 2015-03-08 ENCOUNTER — Ambulatory Visit (INDEPENDENT_AMBULATORY_CARE_PROVIDER_SITE_OTHER): Payer: Medicare Other | Admitting: Nurse Practitioner

## 2015-03-08 ENCOUNTER — Encounter (HOSPITAL_COMMUNITY): Payer: Self-pay

## 2015-03-08 ENCOUNTER — Encounter: Payer: Self-pay | Admitting: Nurse Practitioner

## 2015-03-08 VITALS — BP 106/71 | HR 77 | Temp 98.1°F | Ht 60.0 in | Wt 123.2 lb

## 2015-03-08 DIAGNOSIS — Z955 Presence of coronary angioplasty implant and graft: Secondary | ICD-10-CM | POA: Diagnosis not present

## 2015-03-08 DIAGNOSIS — I2510 Atherosclerotic heart disease of native coronary artery without angina pectoris: Secondary | ICD-10-CM | POA: Diagnosis present

## 2015-03-08 DIAGNOSIS — K259 Gastric ulcer, unspecified as acute or chronic, without hemorrhage or perforation: Secondary | ICD-10-CM | POA: Diagnosis not present

## 2015-03-08 DIAGNOSIS — E43 Unspecified severe protein-calorie malnutrition: Secondary | ICD-10-CM | POA: Diagnosis not present

## 2015-03-08 DIAGNOSIS — Z6823 Body mass index (BMI) 23.0-23.9, adult: Secondary | ICD-10-CM

## 2015-03-08 DIAGNOSIS — K929 Disease of digestive system, unspecified: Secondary | ICD-10-CM | POA: Diagnosis not present

## 2015-03-08 DIAGNOSIS — I10 Essential (primary) hypertension: Secondary | ICD-10-CM | POA: Diagnosis not present

## 2015-03-08 DIAGNOSIS — R112 Nausea with vomiting, unspecified: Secondary | ICD-10-CM | POA: Insufficient documentation

## 2015-03-08 DIAGNOSIS — Z87891 Personal history of nicotine dependence: Secondary | ICD-10-CM

## 2015-03-08 DIAGNOSIS — R110 Nausea: Secondary | ICD-10-CM

## 2015-03-08 DIAGNOSIS — K838 Other specified diseases of biliary tract: Secondary | ICD-10-CM

## 2015-03-08 DIAGNOSIS — K222 Esophageal obstruction: Secondary | ICD-10-CM | POA: Diagnosis not present

## 2015-03-08 DIAGNOSIS — I25118 Atherosclerotic heart disease of native coronary artery with other forms of angina pectoris: Secondary | ICD-10-CM | POA: Diagnosis not present

## 2015-03-08 DIAGNOSIS — M1990 Unspecified osteoarthritis, unspecified site: Secondary | ICD-10-CM | POA: Diagnosis present

## 2015-03-08 DIAGNOSIS — Z8249 Family history of ischemic heart disease and other diseases of the circulatory system: Secondary | ICD-10-CM | POA: Diagnosis not present

## 2015-03-08 DIAGNOSIS — E860 Dehydration: Secondary | ICD-10-CM | POA: Diagnosis not present

## 2015-03-08 DIAGNOSIS — R1010 Upper abdominal pain, unspecified: Secondary | ICD-10-CM

## 2015-03-08 DIAGNOSIS — K279 Peptic ulcer, site unspecified, unspecified as acute or chronic, without hemorrhage or perforation: Secondary | ICD-10-CM | POA: Diagnosis not present

## 2015-03-08 DIAGNOSIS — I252 Old myocardial infarction: Secondary | ICD-10-CM

## 2015-03-08 DIAGNOSIS — Z9049 Acquired absence of other specified parts of digestive tract: Secondary | ICD-10-CM

## 2015-03-08 DIAGNOSIS — T39395A Adverse effect of other nonsteroidal anti-inflammatory drugs [NSAID], initial encounter: Secondary | ICD-10-CM | POA: Diagnosis present

## 2015-03-08 DIAGNOSIS — R1013 Epigastric pain: Secondary | ICD-10-CM | POA: Diagnosis not present

## 2015-03-08 DIAGNOSIS — R109 Unspecified abdominal pain: Secondary | ICD-10-CM | POA: Diagnosis not present

## 2015-03-08 DIAGNOSIS — M329 Systemic lupus erythematosus, unspecified: Secondary | ICD-10-CM | POA: Diagnosis present

## 2015-03-08 DIAGNOSIS — R1011 Right upper quadrant pain: Secondary | ICD-10-CM | POA: Diagnosis not present

## 2015-03-08 DIAGNOSIS — K9189 Other postprocedural complications and disorders of digestive system: Secondary | ICD-10-CM

## 2015-03-08 DIAGNOSIS — E876 Hypokalemia: Secondary | ICD-10-CM

## 2015-03-08 DIAGNOSIS — E7800 Pure hypercholesterolemia, unspecified: Secondary | ICD-10-CM | POA: Diagnosis present

## 2015-03-08 LAB — CBC
HCT: 38.7 % (ref 36.0–46.0)
Hemoglobin: 12.2 g/dL (ref 12.0–15.0)
MCH: 29.3 pg (ref 26.0–34.0)
MCHC: 31.5 g/dL (ref 30.0–36.0)
MCV: 93 fL (ref 78.0–100.0)
PLATELETS: 643 10*3/uL — AB (ref 150–400)
RBC: 4.16 MIL/uL (ref 3.87–5.11)
RDW: 15 % (ref 11.5–15.5)
WBC: 7.8 10*3/uL (ref 4.0–10.5)

## 2015-03-08 LAB — COMPREHENSIVE METABOLIC PANEL
ALBUMIN: 4.1 g/dL (ref 3.5–5.0)
ALT: 32 U/L (ref 14–54)
AST: 39 U/L (ref 15–41)
Alkaline Phosphatase: 116 U/L (ref 38–126)
Anion gap: 15 (ref 5–15)
BUN: 13 mg/dL (ref 6–20)
CHLORIDE: 97 mmol/L — AB (ref 101–111)
CO2: 29 mmol/L (ref 22–32)
CREATININE: 0.79 mg/dL (ref 0.44–1.00)
Calcium: 9.9 mg/dL (ref 8.9–10.3)
GFR calc Af Amer: >60 mL/min (ref >60)
GLUCOSE: 125 mg/dL — AB (ref 65–99)
Potassium: 2.4 mmol/L — CL (ref 3.5–5.1)
Sodium: 141 mmol/L (ref 135–145)
Total Bilirubin: 0.7 mg/dL (ref 0.3–1.2)
Total Protein: 8.4 g/dL — ABNORMAL HIGH (ref 6.5–8.1)

## 2015-03-08 LAB — MAGNESIUM: Magnesium: 2.1 mg/dL (ref 1.7–2.4)

## 2015-03-08 LAB — LIPASE, BLOOD: LIPASE: 55 U/L — AB (ref 11–51)

## 2015-03-08 MED ORDER — POTASSIUM CHLORIDE IN NACL 40-0.9 MEQ/L-% IV SOLN
INTRAVENOUS | Status: DC
  Administered 2015-03-08 – 2015-03-09 (×3): 100 mL/h via INTRAVENOUS
  Administered 2015-03-10 – 2015-03-11 (×2): 50 mL/h via INTRAVENOUS

## 2015-03-08 MED ORDER — ESCITALOPRAM OXALATE 10 MG PO TABS
10.0000 mg | ORAL_TABLET | Freq: Every day | ORAL | Status: DC
  Administered 2015-03-08 – 2015-03-12 (×4): 10 mg via ORAL
  Filled 2015-03-08 (×4): qty 1

## 2015-03-08 MED ORDER — OXYCODONE-ACETAMINOPHEN 7.5-325 MG PO TABS
1.0000 | ORAL_TABLET | Freq: Four times a day (QID) | ORAL | Status: DC | PRN
  Administered 2015-03-08 – 2015-03-11 (×4): 1 via ORAL
  Filled 2015-03-08 (×5): qty 1

## 2015-03-08 MED ORDER — OXYCODONE-ACETAMINOPHEN 7.5-325 MG PO TABS: 1.0000 | ORAL_TABLET | Freq: Four times a day (QID) | ORAL | Status: DC | PRN

## 2015-03-08 MED ORDER — ACETAMINOPHEN 325 MG PO TABS: 650.0000 mg | ORAL_TABLET | Freq: Four times a day (QID) | ORAL | Status: DC | PRN

## 2015-03-08 MED ORDER — STERILE WATER FOR INJECTION IJ SOLN
INTRAMUSCULAR | Status: AC
Start: 2015-03-08 — End: 2015-03-09
  Filled 2015-03-08: qty 10

## 2015-03-08 MED ORDER — SODIUM CHLORIDE 0.9 % IJ SOLN
3.0000 mL | Freq: Two times a day (BID) | INTRAMUSCULAR | Status: DC
  Administered 2015-03-08 – 2015-03-12 (×5): 3 mL via INTRAVENOUS

## 2015-03-08 MED ORDER — ISOSORBIDE MONONITRATE ER 60 MG PO TB24
30.0000 mg | ORAL_TABLET | Freq: Every day | ORAL | Status: DC
  Administered 2015-03-08 – 2015-03-12 (×4): 30 mg via ORAL
  Filled 2015-03-08 (×5): qty 1

## 2015-03-08 MED ORDER — HEPARIN SODIUM (PORCINE) 5000 UNIT/ML IJ SOLN
5000.0000 [IU] | Freq: Three times a day (TID) | INTRAMUSCULAR | Status: AC
  Administered 2015-03-08 – 2015-03-09 (×4): 5000 [IU] via SUBCUTANEOUS
  Filled 2015-03-08 (×5): qty 1

## 2015-03-08 MED ORDER — IOHEXOL 300 MG/ML  SOLN
100.0000 mL | Freq: Once | INTRAMUSCULAR | Status: AC | PRN
  Administered 2015-03-08: 100 mL via INTRAVENOUS

## 2015-03-08 MED ORDER — CLONIDINE HCL 0.2 MG PO TABS
0.2000 mg | ORAL_TABLET | Freq: Two times a day (BID) | ORAL | Status: DC
  Administered 2015-03-09 – 2015-03-12 (×6): 0.2 mg via ORAL
  Filled 2015-03-08 (×6): qty 1

## 2015-03-08 MED ORDER — ENSURE ENLIVE PO LIQD
237.0000 mL | Freq: Three times a day (TID) | ORAL | Status: DC
  Administered 2015-03-08 – 2015-03-11 (×8): 237 mL via ORAL

## 2015-03-08 MED ORDER — METOPROLOL TARTRATE 50 MG PO TABS
50.0000 mg | ORAL_TABLET | Freq: Three times a day (TID) | ORAL | Status: DC
  Administered 2015-03-08 – 2015-03-12 (×10): 50 mg via ORAL
  Filled 2015-03-08 (×10): qty 1

## 2015-03-08 MED ORDER — AMLODIPINE BESYLATE 5 MG PO TABS
10.0000 mg | ORAL_TABLET | Freq: Every day | ORAL | Status: DC
  Administered 2015-03-08 – 2015-03-12 (×4): 10 mg via ORAL
  Filled 2015-03-08 (×4): qty 2

## 2015-03-08 MED ORDER — POTASSIUM CHLORIDE CRYS ER 20 MEQ PO TBCR
40.0000 meq | EXTENDED_RELEASE_TABLET | ORAL | Status: AC
  Administered 2015-03-08 – 2015-03-09 (×4): 40 meq via ORAL
  Filled 2015-03-08 (×4): qty 2

## 2015-03-08 MED ORDER — TECHNETIUM TC 99M MEBROFENIN IV KIT
5.0000 | PACK | Freq: Once | INTRAVENOUS | Status: DC | PRN
  Administered 2015-03-08: 5 via INTRAVENOUS
  Filled 2015-03-08: qty 6

## 2015-03-08 MED ORDER — ONDANSETRON HCL 4 MG/2ML IJ SOLN
4.0000 mg | Freq: Four times a day (QID) | INTRAMUSCULAR | Status: DC | PRN
  Administered 2015-03-09: 4 mg via INTRAVENOUS
  Filled 2015-03-08 (×3): qty 2

## 2015-03-08 MED ORDER — PANTOPRAZOLE SODIUM 40 MG PO TBEC
40.0000 mg | DELAYED_RELEASE_TABLET | Freq: Every day | ORAL | Status: DC
  Administered 2015-03-08 – 2015-03-09 (×2): 40 mg via ORAL
  Filled 2015-03-08 (×2): qty 1

## 2015-03-08 MED ORDER — ACETAMINOPHEN 650 MG RE SUPP: 650.0000 mg | Freq: Four times a day (QID) | RECTAL | Status: DC | PRN

## 2015-03-08 MED ORDER — HYDROMORPHONE HCL 1 MG/ML IJ SOLN
0.5000 mg | INTRAMUSCULAR | Status: DC | PRN
  Administered 2015-03-08 – 2015-03-12 (×16): 1 mg via INTRAVENOUS
  Filled 2015-03-08 (×16): qty 1

## 2015-03-08 NOTE — Progress Notes (Signed)
Referring Provider: Renaye Rakers, MD Primary Care Physician:  [name], MD Primary GI:  Dr. Jena Gauss  Chief Complaint  Patient presents with  . Follow-up    HPI:   59 year old female presents for follow-up on abdominal pain. Patient had a cholecystectomy on 01/27/2015. She presented to the emergency department on 02/07/2015 complaining of incision and drainage and significant output from JP drain. She underwent an ERCP with biliary stent placement by Dr. Jena Gauss on 02/08/2015. JP drain was removed 02/16/2015. She presented to the emergency department [facility] on 02/17/2015 for continued abdominal pain, nausea, vomiting and status post PCP visit with worsening LFTs and acute renal failure with creatinine at 2.2, mild leukocytosis 11.7 and demonstrated fluid collection in the gallbladder fossa tracking anteriorly. Repeat ERCP and stent change was put on hold due to inferior ischemia on EKG 10/28. The patient however began improving in biliary output decreased for cardiology to clear her for repeat ERCP and the procedure was subsequently postponed. The patient was discharged on 02/23/2015.  Cardiology saw the patient is an outpatient on 03/07/2015 (yesterday). NOT reversible defects involving the lateral wall consistent with areas of ischemia. She was cleared for ERCP by Dr. Mayford Knife at [facility] on 02/23/2015. Cardiology is holding off on cath right now due to likely need of D APTT post cath which would delay an invasive procedure. They feel this time that the priority is abdominal pain control and ERCP due to the unrelenting nature of her symptoms.  Today she corroborates the above information. She is still having pain and has been told the stent may not be big enough. Her symptoms include upper abdominal pain and nausea. Symptoms worse after eating. Is at the point she does not want to eat. Pain is 10/10 at its worst, limiting her ability to function. Currently 8/10 in the office today. It wakes  her during the night and she ends up sitting in a recliner in pain. Denies hematochezia, melena. She did have diarrhea on Monday for a couple days along with cramping. She has obtained her clonidine prescription and her BP is very well controlled today. Denies chest pain, dyspnea, dizziness, lightheadedness, syncope, near syncope. Denies any other upper or lower GI symptoms.  Past Medical History  Diagnosis Date  . Myocardial infarction (HCC) 2012   . CVD (cardiovascular disease)     Stent to mid RCA 2012 [name] 2.5 X 12 mm  . Systemic lupus (HCC)   . Anxiety   . Hypertension   . Arthritis   . Hypercholesterolemia   . PUD (peptic ulcer disease)     Dr. in [place] in early 2016    Past Surgical History  Procedure Laterality Date  . Coronary angioplasty with stent placement  2012, 2013.    [name] DES to mid RCA. Stents X 2 to unknown artery  . Abdominal hysterectomy    . Tubal ligation    . Breast lumpectomy    . Cholecystectomy N/A 01/27/2015    Procedure: CHOLECYSTECTOMY;  Surgeon: Franky Macho, MD;  Location: [facility];  Service: General;  Laterality: N/A;  converted to open at 0941  . Esophagogastroduodenoscopy      [place]:PUD per patient  . Colonoscopy  2008    [facility], normal.   . Ercp N/A 02/08/2015    [id number] biliary leak/s/p stent placement  . Biliary stent placement N/A 02/08/2015    Procedure: BILIARY STENT PLACEMENT;  Surgeon: Corbin Ade, MD;  Location: [facility];  Service: Endoscopy;  Laterality: N/A;  . Sphincterotomy N/A 02/08/2015    Procedure: SPHINCTEROTOMY;  Surgeon: Corbin Adeobert M Rourk, MD;  Location: [facility];  Service: Endoscopy;  Laterality: N/A;    Current Outpatient Prescriptions  Medication Sig Dispense Refill  . amLODipine (NORVASC) 10 MG tablet Take 1 tablet (10 mg total) by mouth daily. 30 tablet 6  . cloNIDine (CATAPRES) 0.2 MG tablet Take 1 tablet (0.2 mg total) by mouth 2 (two) times daily. 60 tablet 6  . isosorbide mononitrate (IMDUR) 30 MG 24 hr  tablet Take 1 tablet (30 mg total) by mouth daily. 30 tablet 0  . metoprolol (LOPRESSOR) 50 MG tablet Take 50 mg by mouth 3 (three) times daily.    . Multiple Vitamins-Calcium (ONE-A-DAY WOMENS FORMULA PO) Take 1 tablet by mouth daily.    Marland Kitchen. omeprazole (PRILOSEC) 40 MG capsule Take 40 mg by mouth daily.    Marland Kitchen. escitalopram (LEXAPRO) 10 MG tablet Take 10 mg by mouth.    . feeding supplement, ENSURE ENLIVE, (ENSURE ENLIVE) LIQD Take 237 mLs by mouth 3 (three) times daily between meals. (Patient not taking: Reported on 03/08/2015) 237 mL 12   No current facility-administered medications for this visit.    Allergies as of 03/08/2015 - Review Complete 03/08/2015  Allergen Reaction Noted  . Hydrocodone-acetaminophen Nausea And Vomiting 02/17/2015    Family History  Problem Relation Age of Onset  . CAD Father     CABG  . Heart attack Brother     PTCA  . Hypertension Sister   . Hypertension Father   . Colon cancer Neg Hx   . Liver disease Neg Hx     Social History   Social History  . Marital Status: Legally Separated    Spouse Name: N/A  . Number of Children: 2  . Years of Education: N/A   Occupational History  . Nutritionist    Social History Main Topics  . Smoking status: Former Smoker    Types: Cigarettes  . Smokeless tobacco: None  . Alcohol Use: No  . Drug Use: No  . Sexual Activity: No   Other Topics Concern  . None   Social History Narrative   Lives alone. Moved from [place] 2 years ago.     Review of Systems: General: Negative for anorexia, weight loss, fever, chills. Eyes: Negative for vision changes.  ENT: Negative for hoarseness, difficulty swallowing. CV: Negative for chest pain, angina at this time. Negative for palpitations, peripheral edema.  Respiratory: Negative for dyspnea at rest, cough, sputum, wheezing.  GI: See history of present illness. Derm: Negative for rash or itching.  Endo: Negative for unusual weight change.  Heme: Negative for  bruising or bleeding. Allergy: Negative for rash or hives.   Physical Exam: BP 106/71 mmHg  Pulse 77  Temp(Src) 98.1 F (36.7 C) (Oral)  Ht 5' (1.524 m)  Wt 123 lb 3.2 oz (55.883 kg)  BMI 24.06 kg/m2 General:   Appears quite in pain, holding her stomach and sitting forward. Alert and oriented. Pleasant and cooperative. Well-nourished and well-developed.  Head:  Normocephalic and atraumatic. Eyes:  Without icterus, sclera clear and conjunctiva pink.  Ears:  Normal auditory acuity. Cardiovascular:  S1, S2 present without murmurs appreciated. Extremities without clubbing or edema. Respiratory:  Clear to auscultation bilaterally. No wheezes, rales, or rhonchi. No distress.  Gastrointestinal:  +BS, soft, and non-distended. Severe TTP with light palpation to upper abdomen and especially RUQ. No lower abdominal TTP. No HSM noted. No guarding or rebound. No masses  appreciated.  Rectal:  Deferred  Skin:  Intact without significant lesions or rashes. Neurologic:  Alert and oriented x4;  grossly normal neurologically. Psych:  Alert and cooperative. Normal mood and affect. Heme/Lymph/Immune: No excessive bruising noted.    03/08/2015 9:06 AM

## 2015-03-08 NOTE — Addendum Note (Signed)
Addended by: Delane GingerGILL, Ayman Brull A on: 03/08/2015 02:08 PM   Modules accepted: Orders, Medications

## 2015-03-08 NOTE — H&P (Signed)
[facility] History and Physical  Sheila Plentynetta Clarin WUJ:[id number] DOB: 09-13-1955 DOA: 03/08/2015  Referring physician: Wynne DustEric Gill, NP GI PCP: Geraldo PitterBLAND,VEITA J, MD   Chief Complaint: abdominal pain  HPI: Sheila [name] is a 59 y.o. female with a history of abdominal pain who had a cholecystectomy done in 01/2015. [facility] and found to have a bile leak. She was seen by GI and there was possibility of repeat ERCP with replacing biliary stent. Unfortunately she had ischemic changes on EKG and was seen by cardiology. Stress test showed small reversible defects, but she was cleared by cardiology for further workup of her abdominal pain. The time for discharge she reports she was improving but reports that her symptoms returned within 2-3 days after discharge. She describes constant aching epigastric pain which radiates to her right upper quadrant. She has intermittent nausea but no vomiting. Her by mouth intake has been poor and her appetite has been poor. She's not had any fever. She came back to the GI office in follow-up and was referred for direct admission for further workup of her abdominal pain.   Review of Systems:  Pertinent positives as per HPI, otherwise negative  Past Medical History  Diagnosis Date  . Myocardial infarction (HCC) 2012   . CVD (cardiovascular disease)     Stent to mid RCA 2012 [name] 2.5 X 12 mm  . Systemic lupus (HCC)   . Anxiety   . Hypertension   . Arthritis   . Hypercholesterolemia   . PUD (peptic ulcer disease)     Dr. in [place] in early 2016   Past Surgical History  Procedure Laterality Date  . Coronary angioplasty with stent placement  2012, 2013.    [name] DES to mid RCA. Stents X 2 to unknown artery  . Abdominal hysterectomy    . Tubal ligation    . Breast lumpectomy    . Cholecystectomy N/A 01/27/2015    Procedure: CHOLECYSTECTOMY;  Surgeon: Franky MachoMark Jenkins, MD;  Location: [facility];  Service: General;   Laterality: N/A;  converted to open at 0941  . Esophagogastroduodenoscopy      [place]:PUD per patient  . Colonoscopy  2008    [facility], [facility].   . Ercp N/A 02/08/2015    [id number]:post-op biliary leak/s/p stent placement  . Biliary stent placement N/A 02/08/2015    Procedure: BILIARY STENT PLACEMENT;  Surgeon: Corbin Adeobert M Rourk, MD;  Location: [facility];  Service: Endoscopy;  Laterality: N/A;  . Sphincterotomy N/A 02/08/2015    Procedure: SPHINCTEROTOMY;  Surgeon: Corbin Adeobert M Rourk, MD;  Location: [facility];  Service: Endoscopy;  Laterality: N/A;   Social History:  reports that she has quit smoking. Her smoking use included Cigarettes. She has never used smokeless tobacco. She reports that she does not drink alcohol or use illicit drugs.  Allergies  Allergen Reactions  . Hydrocodone-Acetaminophen Nausea And Vomiting    Family History  Problem Relation Age of Onset  . CAD Father     CABG  . Heart attack Brother     PTCA  . Hypertension Sister   . Hypertension Father   . Colon cancer Neg Hx   . Liver disease Neg Hx     Prior to Admission medications   Medication Sig Start Date End Date Taking? Authorizing Provider  amLODipine (NORVASC) 10 MG tablet Take 1 tablet (10 mg total) by mouth daily. 03/07/15   Jodelle GrossKathryn M Lawrence, NP  cloNIDine (CATAPRES) 0.2 MG tablet Take  1 tablet (0.2 mg total) by mouth 2 (two) times daily. 03/07/15   Jodelle Gross, NP  escitalopram (LEXAPRO) 10 MG tablet Take 10 mg by mouth.    Historical Provider, MD  feeding supplement, ENSURE ENLIVE, (ENSURE ENLIVE) LIQD Take 237 mLs by mouth 3 (three) times daily between meals. Patient not taking: Reported on 03/08/2015 02/23/15   Nishant Dhungel, MD  isosorbide mononitrate (IMDUR) 30 MG 24 hr tablet Take 1 tablet (30 mg total) by mouth daily. 02/23/15   Nishant Dhungel, MD  metoprolol (LOPRESSOR) 50 MG tablet Take 50 mg by mouth 3 (three) times daily.    Historical Provider, MD  Multiple Vitamins-Calcium (ONE-A-DAY WOMENS  FORMULA PO) Take 1 tablet by mouth daily.    Historical Provider, MD  naproxen sodium (ANAPROX) 220 MG tablet Take 220 mg by mouth 2 (two) times daily with a meal.    Historical Provider, MD  omeprazole (PRILOSEC) 40 MG capsule Take 40 mg by mouth daily.    Historical Provider, MD  oxyCODONE-acetaminophen (PERCOCET) 7.5-325 MG tablet Take 1 tablet by mouth every 6 (six) hours as needed for severe pain. 03/08/15   Anice Paganini, NP   Physical Exam: Filed Vitals:   03/08/15 1308  BP: 178/95  Pulse: 83  Temp: 98.7 F (37.1 C)  TempSrc: Oral  Height: 5' (1.524 m)  Weight: 54.976 kg (121 lb 3.2 oz)  SpO2: 100%    Wt Readings from Last 3 Encounters:  03/08/15 54.976 kg (121 lb 3.2 oz)  03/08/15 55.883 kg (123 lb 3.2 oz)  03/07/15 55.702 kg (122 lb 12.8 oz)    General:  Appears calm and comfortable Eyes: PERRL, normal lids, irises & conjunctiva ENT: grossly normal hearing, lips & tongue Neck: no LAD, masses or thyromegaly Cardiovascular: RRR, no m/r/g. No LE edema. Telemetry: SR, no arrhythmias  Respiratory: CTA bilaterally, no w/r/r. Normal respiratory effort. Abdomen: soft, tender in epigastrium, BS+ Skin: no rash or induration seen on limited exam Musculoskeletal: grossly normal tone BUE/BLE Psychiatric: grossly normal mood and affect, speech fluent and appropriate Neurologic: grossly non-focal.          Labs on Admission:  Basic Metabolic Panel:  Recent Labs Lab 03/08/15 1331  NA 141  K 2.4*  CL 97*  CO2 29  GLUCOSE 125*  BUN 13  CREATININE 0.79  CALCIUM 9.9  MG 2.1   Liver Function Tests:  Recent Labs Lab 03/08/15 1331  AST 39  ALT 32  ALKPHOS 116  BILITOT 0.7  PROT 8.4*  ALBUMIN 4.1    Recent Labs Lab 03/08/15 1331  LIPASE 55*   No results for input(s): AMMONIA in the last 168 hours. CBC:  Recent Labs Lab 03/08/15 1331  WBC 7.8  HGB 12.2  HCT 38.7  MCV 93.0  PLT 643*   Cardiac Enzymes: No results for input(s): CKTOTAL, CKMB,  CKMBINDEX, TROPONINI in the last 168 hours.  BNP (last 3 results) No results for input(s): BNP in the last 8760 hours.  ProBNP (last 3 results) No results for input(s): PROBNP in the last 8760 hours.  CBG: No results for input(s): GLUCAP in the last 168 hours.  Radiological Exams on Admission: Nm Hepatobiliary Liver Func  03/08/2015  CLINICAL DATA:  Bile leak post cholecystectomy, underwent CBD stenting, continued pain, question continued bile leak EXAM: NUCLEAR MEDICINE HEPATOBILIARY IMAGING TECHNIQUE: Sequential images of the abdomen were obtained out to 90 minutes following intravenous administration of radiopharmaceutical. RADIOPHARMACEUTICALS:  5 mCi Tc-12m  Choletec IV COMPARISON:  02/18/2015 FINDINGS: Normal tracer extraction from bloodstream indicating normal hepatocellular function. Prompt excretion of tracer into biliary tree. Small bowel visualized by 18 minutes. Gallbladder surgically absent. No abnormal localization of tracer identified to suggest bile leak. Specifically, abnormal infrahepatic collection of tracer seen on the previous exam is no longer identified. Small amount of duodenogastric reflux of tracer is noted. IMPRESSION: Patent biliary tree. No evidence of bile leak. Small amount of duodenogastric reflux of tracer is incidentally noted. Electronically Signed   By: Ulyses Southward M.D.   On: 03/08/2015 16:27    Assessment/Plan Active Problems:   CAD (coronary artery disease)   HTN (hypertension)   Dehydration   Hypokalemia   Abdominal pain   Nausea without vomiting   1. Abdominal pain. Etiology is unclear. Had a scan has been ordered which did not show any evidence of biliary leak. Further imaging with CT scan abdomen has been ordered. GI is following. Will continue on clear liquids for now. 2. Hypertension. Continue outpatient regimen. 3. Hypokalemia. Likely related to decreased by mouth intake. Will replace. Magnesium is normal. 4. Dehydration. We'll continue with  IV fluids. 5. Coronary artery disease. No complaint of chest pain at this time. Recently followed up with cardiology and was cleared for further workup of abdominal pain. They did not pursue cardiac cath until abdominal workup has been completed since she will likely need antiplatelet agents which would prevent any GI interventions.  Code Status: full code DVT Prophylaxis: heparin Family Communication: discussed with patient Disposition Plan: discharge home once abdominal pain is improving  Time spent:  [facility] Pager [phone]

## 2015-03-08 NOTE — Assessment & Plan Note (Signed)
Worsening abdominal pain in the setting of postcholecystectomy bile leak as noted above. Patient is being admitted to the hospital for further evaluation and management.

## 2015-03-08 NOTE — Assessment & Plan Note (Signed)
Worsening nausea in addition to abdominal pain in the setting of postcholecystectomy bile leak as noted above. Query recurrent leak versus other postoperative etiology. Patient is being admitted to the hospital for further evaluation and management.

## 2015-03-08 NOTE — Progress Notes (Signed)
Discussed with Dr. Jena Gaussourk. Patient has had HIDA scan and per conversation with radiologist no apparent biliary leak. Will proceed with CT abdomen/prlvis w/wo contrast for further evaluation.  Attending note:  HIDA negative for leak. LFTs look good. These findings indicate the postoperative biliary leak has healed and stent remains patent;  given recurrent abdominal pain,agree with proceeding with a CT scan to reassess for abscess, etc.

## 2015-03-08 NOTE — Patient Instructions (Signed)
1. I will discuss her case with Dr. Jena Gaussourk so we can plan for what to do related to possible ERCP. 2. I sent a prescription for Percocet 7.5/325. Take 1 every 6 hours as needed for pain. Do not drive while taking this medication. 3. Return for follow-up in 4 weeks.

## 2015-03-08 NOTE — Assessment & Plan Note (Addendum)
Patient with a complex history of cholecystectomy with subsequent postop bile leak. She began having increased output from her JP drain and was brought in for an ERCP and stent placement. The drain was told by his surgeon at the outpatient visit. She saw her PCP and was noted to be jaundiced with increased LFTs and was subsequent sent to [facility] emergency room. They were considering a repeat ERCP and stent change out however she developed myocardial ischemia on EKG. Bedtime cardiology was able to clear her for ERCP she was clinically improving and subsequently discharged home. At some point between discharge and presentation today her abdominal pain began worsening. She is also having nausea. Her pain is severe, especially worse on palpation to the right upper quadrant. Pain wakes her at night and she ends up sitting in a recliner all night. Worse with eating, afraid to eat currently.  She could have a continued bile leak or possible abscess. After discussing the case with Dr. Jena Gaussourk we will admit her directly to the hospital under hospitalist care. We will order stat HIDA scan to check for bile leak, stat labs including CBC, CMP, and lipase. If the HIDA scan does not indicate bile leak will likely need a CT of the abdomen and pelvis with contrast to further evaluate. Spoke with Dr. Kerry HoughMemon with possible's group who agrees to admit her on to telemetry.

## 2015-03-09 DIAGNOSIS — R112 Nausea with vomiting, unspecified: Secondary | ICD-10-CM

## 2015-03-09 DIAGNOSIS — K838 Other specified diseases of biliary tract: Secondary | ICD-10-CM

## 2015-03-09 DIAGNOSIS — K929 Disease of digestive system, unspecified: Secondary | ICD-10-CM

## 2015-03-09 DIAGNOSIS — R1013 Epigastric pain: Secondary | ICD-10-CM

## 2015-03-09 LAB — CBC
HCT: 33.4 % — ABNORMAL LOW (ref 36.0–46.0)
HEMOGLOBIN: 10.2 g/dL — AB (ref 12.0–15.0)
MCH: 29.3 pg (ref 26.0–34.0)
MCHC: 30.5 g/dL (ref 30.0–36.0)
MCV: 96 fL (ref 78.0–100.0)
Platelets: 539 10*3/uL — ABNORMAL HIGH (ref 150–400)
RBC: 3.48 MIL/uL — ABNORMAL LOW (ref 3.87–5.11)
RDW: 15.1 % (ref 11.5–15.5)
WBC: 9.9 10*3/uL (ref 4.0–10.5)

## 2015-03-09 LAB — COMPREHENSIVE METABOLIC PANEL
ALBUMIN: 3.3 g/dL — AB (ref 3.5–5.0)
ALK PHOS: 95 U/L (ref 38–126)
ALT: 27 U/L (ref 14–54)
ANION GAP: 5 (ref 5–15)
AST: 31 U/L (ref 15–41)
BILIRUBIN TOTAL: 0.5 mg/dL (ref 0.3–1.2)
BUN: 12 mg/dL (ref 6–20)
CALCIUM: 9.2 mg/dL (ref 8.9–10.3)
CO2: 28 mmol/L (ref 22–32)
Chloride: 111 mmol/L (ref 101–111)
Creatinine, Ser: 0.54 mg/dL (ref 0.44–1.00)
GFR calc Af Amer: >60 mL/min (ref >60)
GFR calc non Af Amer: >60 mL/min (ref >60)
GLUCOSE: 123 mg/dL — AB (ref 65–99)
Potassium: 4.3 mmol/L (ref 3.5–5.1)
SODIUM: 144 mmol/L (ref 135–145)
TOTAL PROTEIN: 6.9 g/dL (ref 6.5–8.1)

## 2015-03-09 LAB — TROPONIN I
TROPONIN I: 0.05 ng/mL — AB (ref <0.031)
TROPONIN I: 0.04 ng/mL — AB (ref <0.031)
Troponin I: 0.04 ng/mL — ABNORMAL HIGH (ref <0.031)

## 2015-03-09 MED ORDER — ZOLPIDEM TARTRATE 5 MG PO TABS
5.0000 mg | ORAL_TABLET | Freq: Once | ORAL | Status: AC
  Administered 2015-03-09: 5 mg via ORAL
  Filled 2015-03-09: qty 1

## 2015-03-09 MED ORDER — ZOLPIDEM TARTRATE 5 MG PO TABS
5.0000 mg | ORAL_TABLET | Freq: Every evening | ORAL | Status: DC | PRN
  Administered 2015-03-09 – 2015-03-11 (×3): 5 mg via ORAL
  Filled 2015-03-09 (×3): qty 1

## 2015-03-09 MED ORDER — PANTOPRAZOLE SODIUM 40 MG IV SOLR
40.0000 mg | INTRAVENOUS | Status: DC
  Administered 2015-03-09: 40 mg via INTRAVENOUS
  Filled 2015-03-09: qty 40

## 2015-03-09 NOTE — Progress Notes (Signed)
Subjective:  Continues to have severe pain in the epigastrium that radiates throughout upper abdomen both ruq/luq and sometimes into the back. N/V. No hematemesis. Stool have been loose. No melena, brbpr.   Objective: Vital signs in last 24 hours: Temp:  [98 F (36.7 C)-98.7 F (37.1 C)] 98.2 F (36.8 C) (11/17 0536) Pulse Rate:  [77-88] 88 (11/17 0536) Resp:  [18] 18 (11/17 0536) BP: (106-178)/(71-95) 133/89 mmHg (11/17 0536) SpO2:  [95 %-100 %] 95 % (11/17 0536) Weight:  [121 lb 3.2 oz (54.976 kg)-123 lb 3.2 oz (55.883 kg)] 121 lb 3.2 oz (54.976 kg) (11/16 1308) Last BM Date: 03/07/15 General:   Alert,  Appears uncomfortable. NAD Head:  Normocephalic and atraumatic. Eyes:  Sclera clear, no icterus.  Abdomen:  Soft, moderate tenderness throughout upper abd. abd is very soft. Normal bowel sounds, without guarding, and without rebound.   Extremities:  Without clubbing, deformity or edema. Neurologic:  Alert and  oriented x4;  grossly normal neurologically. Skin:  Intact without significant lesions or rashes. Psych:  Alert and cooperative. Normal mood and affect.  Intake/Output from previous day: 11/16 0701 - 11/17 0700 In: 1471.7 [P.O.:240; I.V.:1231.7] Out: 250 [Urine:250] Intake/Output this shift:    Lab Results: CBC  Recent Labs  03/08/15 1331 03/09/15 0614  WBC 7.8 9.9  HGB 12.2 10.2*  HCT 38.7 33.4*  MCV 93.0 96.0  PLT 643* 539*   BMET  Recent Labs  03/08/15 1331 03/09/15 0614  NA 141 144  K 2.4* 4.3  CL 97* 111  CO2 29 28  GLUCOSE 125* 123*  BUN 13 12  CREATININE 0.79 0.54  CALCIUM 9.9 9.2   LFTs  Recent Labs  03/08/15 1331 03/09/15 0614  BILITOT 0.7 0.5  ALKPHOS 116 95  AST 39 31  ALT 32 27  PROT 8.4* 6.9  ALBUMIN 4.1 3.3*    Recent Labs  03/08/15 1331  LIPASE 55*   PT/INR No results for input(s): LABPROT, INR in the last 72 hours.    Imaging Studies: Ct Abdomen Pelvis Wo Contrast  02/17/2015  CLINICAL DATA:  Recent  cholecystectomy for gangrenous cholecystitis. Right upper quadrant abdominal pain. EXAM: CT ABDOMEN AND PELVIS WITHOUT CONTRAST TECHNIQUE: Multidetector CT imaging of the abdomen and pelvis was performed following the standard protocol without IV contrast. COMPARISON:  01/25/2015 FINDINGS: Lower chest: Patchy parenchymal densities in the left lower lobe are concerning for a small focus of aspiration or pneumonia. Evidence for coronary artery calcifications. Hepatobiliary: There is a nonmetallic biliary stent with the distal aspect in the duodenum. Cholecystectomy clips are noted. Vague low-density in the gallbladder fossa is suggestive for fluid. This fluid tracks anterior towards the abdominal wall. Tiny focus of gas near the gallbladder fossa. Three small calcific foci between the liver and right kidney upper pole are concerning for retained gallstones. These small calcifications are grouped together and roughly measure up to 1 cm in size. Inflammatory changes along the right upper anterior abdominal wall related to recent surgery. Limited evaluation of the liver and gallbladder fossa without intravenous contrast. Pancreas: Normal appearance of the pancreas without duct dilatation or inflammation. Spleen: No acute abnormality to the spleen. Adrenals/Urinary Tract: Normal appearance of the adrenal glands. Normal appearance of both kidneys without stones or hydronephrosis. Normal appearance of the urinary bladder. Stomach/Bowel: Moderate stool burden throughout the colon without acute colonic inflammation. Normal appearance of small bowel without obstruction. There may be mild inflammation of the proximal duodenum adjacent to the liver. Vascular/Lymphatic: Atherosclerotic calcifications in  the aorta and iliac arteries without aneurysm. No significant abdominal or pelvic lymphadenopathy. Reproductive: Uterus is surgically absent. Other: No significant free fluid. Musculoskeletal: No suspicious bone lesions.  IMPRESSION: Post cholecystectomy. There is a small amount of fluid and gas in the gallbladder fossa with expected inflammatory changes from recent surgery. Nonmetallic biliary stent. Small calcifications between the liver and right kidney are suggestive for retained gallstones. Patchy densities in the left lower lobe are concerning for a small focus of pneumonia or aspiration pneumonitis. Electronically Signed   By: Richarda Overlie M.D.   On: 02/17/2015 17:26   Dg Chest 2 View  02/17/2015  CLINICAL DATA:  Fever, chest pain, shortness of breath. EXAM: CHEST  2 VIEW COMPARISON:  January 25, 2015. FINDINGS: The heart size and mediastinal contours are within normal limits. Both lungs are clear. No pneumothorax or pleural effusion is noted. The visualized skeletal structures are unremarkable. IMPRESSION: No active cardiopulmonary disease. Electronically Signed   By: Lupita Raider, M.D.   On: 02/17/2015 15:50   Nm Hepatobiliary Liver Func  03/08/2015  CLINICAL DATA:  Bile leak post cholecystectomy, underwent CBD stenting, continued pain, question continued bile leak EXAM: NUCLEAR MEDICINE HEPATOBILIARY IMAGING TECHNIQUE: Sequential images of the abdomen were obtained out to 90 minutes following intravenous administration of radiopharmaceutical. RADIOPHARMACEUTICALS:  5 mCi Tc-66m  Choletec IV COMPARISON:  02/18/2015 FINDINGS: Normal tracer extraction from bloodstream indicating normal hepatocellular function. Prompt excretion of tracer into biliary tree. Small bowel visualized by 18 minutes. Gallbladder surgically absent. No abnormal localization of tracer identified to suggest bile leak. Specifically, abnormal infrahepatic collection of tracer seen on the previous exam is no longer identified. Small amount of duodenogastric reflux of tracer is noted. IMPRESSION: Patent biliary tree. No evidence of bile leak. Small amount of duodenogastric reflux of tracer is incidentally noted. Electronically Signed   By: Ulyses Southward  M.D.   On: 03/08/2015 16:27   Nm Hepatobiliary Including Gb  02/18/2015  CLINICAL DATA:  Status post cholecystectomy, evaluate for bile leak EXAM: NUCLEAR MEDICINE HEPATOBILIARY IMAGING TECHNIQUE: Sequential images of the abdomen were obtained out to 60 minutes following intravenous administration of radiopharmaceutical. RADIOPHARMACEUTICALS:  5.3 mCi Tc-37m  Choletec IV COMPARISON:  CT abdomen pelvis dated 02/17/2015 FINDINGS: Normal hepatic excretion. Within 15 minutes, radiotracer begins to pool along the inferior liver margin. This is clearly extraluminal and reflects a bile leak. Study was aborted after 45 minutes. IMPRESSION: Study is positive for bile leak. These results will be called to the ordering clinician or representative by the Radiologist Assistant, and communication documented in the PACS or zVision Dashboard. Electronically Signed   By: Charline Bills M.D.   On: 02/18/2015 14:06   Ct Abdomen Pelvis W Contrast  03/08/2015  CLINICAL DATA:  Upper abdominal pain with nausea since cholecystectomy approximately 5 weeks ago. Weight loss. EXAM: CT ABDOMEN AND PELVIS WITH CONTRAST TECHNIQUE: Multidetector CT imaging of the abdomen and pelvis was performed using the standard protocol following bolus administration of intravenous contrast. CONTRAST:  OMNIPAQUE IOHEXOL 300 MG/ML  SOLN COMPARISON:  Abdominal pelvic CT 02/17/2015.  MRCP 02/18/2015. FINDINGS: Lower chest: Clear lung bases. No significant pleural or pericardial effusion. There is a small hiatal hernia. Hepatobiliary: Plastic biliary stent remains well positioned. The biliary system is decompressed. Peripherally enhancing fluid collection within the cholecystectomy bed measures 1.6 cm on image 18, improved from the MRCP. The subhepatic sinus tract extending into the right anterior abdominal wall appears less evident. No new or enlarging intra abdominal  fluid collections are demonstrated. There are probable small spilled gallstones  in the subhepatic space as noted previously (axial images 21 and 22). The remainder of the liver appears unremarkable. Pancreas: Unremarkable. No pancreatic ductal dilatation or surrounding inflammatory changes. Spleen: Stable in appearance without suspicious findings. Adrenals/Urinary Tract: Both adrenal glands appear normal. The kidneys appear normal without evidence of urinary tract calculus, suspicious lesion or hydronephrosis. No bladder abnormalities are seen. Stomach/Bowel: No evidence of bowel wall thickening, distention or surrounding inflammatory change. Vascular/Lymphatic: There are no enlarged abdominal or pelvic lymph nodes. Moderate atherosclerosis of the aorta, its branches and the iliac arteries again noted. No evidence of large vessel occlusion. Reproductive: Hysterectomy.  No adnexal mass. Other: Postsurgical changes within the anterior abdominal wall without definite residual fistula. Musculoskeletal: No acute or significant osseous findings. IMPRESSION: 1. Improving fluid collection in the cholecystectomy bed status post recent cholecystectomy complicated by bile leak. No new or enlarging fluid collections identified. Probable small gallstones in the subhepatic space again noted. 2. The biliary system remains decompressed status post biliary stenting. 3. No new findings identified. Electronically Signed   By: Carey BullocksWilliam  Veazey M.D.   On: 03/08/2015 19:16   Mr 3d Recon At Scanner  02/18/2015  CLINICAL DATA:  Status post laparoscopic cholecystectomy on 01/27/2015, bile leak via surgical wound, pain/tenderness EXAM: MRI ABDOMEN WITHOUT AND WITH CONTRAST (INCLUDING MRCP) TECHNIQUE: Multiplanar multisequence MR imaging of the abdomen was performed both before and after the administration of intravenous contrast. Heavily T2-weighted images of the biliary and pancreatic ducts were obtained, and three-dimensional MRCP images were rendered by post processing. CONTRAST:  12mL MULTIHANCE GADOBENATE  DIMEGLUMINE 529 MG/ML IV SOLN COMPARISON:  Nuclear medicine hepatobiliary scan dated 02/18/2015. CT abdomen pelvis dated 02/09/2015. ERCP dated 02/08/2015. FINDINGS: Lower chest: Mild patchy opacity at the left lung base (series 7/image 6). Cardiomegaly. Hepatobiliary: Liver is notable for mild hepatic steatosis. No suspicious/enhancing hepatic lesions. Mild increased perfusion along the gallbladder fossa. Status post cholecystectomy. No intrahepatic or extrahepatic ductal dilatation. Common duct measures 5 mm (series 6/image 24). No choledocholithiasis is seen. 1.6 x 2.6 cm complex fluid collection with layering fluid-fluid level in the gallbladder fossa (series 8/image 21), likely reflecting a biloma. This collection is adjacent to susceptibility artifact from a surgical clips (series 8/image 20), likely corresponding to the known bile leak at the cystic duct remnant/stump. Direct drainage of the collection to the skin surface via a catheter tract along the right anterior abdomen and exiting the right lateral abdominal wall (series 8/images 24, 27, and 35). Pancreas: Within normal limits. Spleen: Within normal limits. Adrenals/Urinary Tract: Adrenal glands are within normal limits. Kidneys within normal limits.  No hydronephrosis. Stomach/Bowel: Stomach is within normal limits. Visualized bowel is unremarkable. Vascular/Lymphatic: No evidence of abdominal aortic aneurysm. No suspicious abdominal lymphadenopathy. Other: No abdominal ascites. Musculoskeletal: No focal osseous lesions. IMPRESSION: Status post cholecystectomy. No intrahepatic or extrahepatic ductal dilatation. Common duct measures 5 mm. No choledocholithiasis is seen. 1.6 x 2.6 cm complex fluid collection/biloma in the gallbladder fossa, likely corresponding to the known bile leak at the cystic duct remnant/stump. Drug drainage of the collection to the skin surface deviated a catheter tract along the right anterior abdomen and exiting the right  lateral abdominal wall. Additional ancillary findings as above. Electronically Signed   By: Charline BillsSriyesh  Krishnan M.D.   On: 02/18/2015 19:43   Nm Myocar Multi W/spect W/wall Motion / Ef  02/20/2015  CLINICAL DATA:  Chest pain.  Coronary artery disease. EXAM: MYOCARDIAL IMAGING  WITH SPECT (REST AND PHARMACOLOGIC-STRESS) GATED LEFT VENTRICULAR WALL MOTION STUDY LEFT VENTRICULAR EJECTION FRACTION TECHNIQUE: Standard myocardial SPECT imaging was performed after resting intravenous injection of 10 mCi Tc-39m sestamibi. Subsequently, intravenous infusion of Lexiscan was performed under the supervision of the Cardiology staff. At peak effect of the drug, 30 mCi Tc-80m sestamibi was injected intravenously and standard myocardial SPECT imaging was performed. Quantitative gated imaging was also performed to evaluate left ventricular wall motion, and estimate left ventricular ejection fraction. COMPARISON:  03/21/2014 FINDINGS: Perfusion: 2 reversible defects involving the lateral wall. The smaller defect is located along the anterior aspect of the lateral wall near the apex and the other slightly larger defect is located in the midportion of the lateral wall posteriorly. Wall Motion: Lateral wall motion abnormalities. The remaining walls of the left ventricle demonstrate normal wall motion and thickening with contraction. Left Ventricular Ejection Fraction: 70 % End diastolic volume 84 ml End systolic volume 25 ml IMPRESSION: 1. Two reversible defects involving the lateral wall consistent with areas of ischemia. 2. Lateral wall motion abnormality. 3. Left ventricular ejection fraction 70% 4. High-risk stress test findings*. *2012 Appropriate Use Criteria for Coronary Revascularization Focused Update: J Am Coll Cardiol. 2012;59(9):857-881. http://content.dementiazones.com.aspx?articleid=[email or web address] These results will be called to the ordering clinician or representative by the Radiologist Assistant, and communication  documented in the PACS or zVision Dashboard. Electronically Signed   By: Rudie Meyer M.D.   On: 02/20/2015 15:36   Dg Ercp Biliary & Pancreatic Ducts  02/08/2015  CLINICAL DATA:  Sphincterotomy.  Biliary stent placement. EXAM: ERCP TECHNIQUE: Multiple spot images obtained with the fluoroscopic device and submitted for interpretation post-procedure. COMPARISON:  CT abdomen and pelvis- 01/25/2015; abdominal ultrasound - 01/25/2015 FINDINGS: Three spot intraoperative images during ERCP are provided for review. Initial image demonstrates an ERCP probe overlying the right upper abdominal quadrant. Surgical clips overlie the expected location of the gallbladder fossa. A surgical drain also overlies the expected location of the gallbladder fossa. Subsequent images demonstrate selective cannulation opacification of the CBD. There is apparent extravasation of injected contrast adjacent to the cholecystectomy clips as well as the cranial end of the surgically placed drain. There is no definitive opacification of the cystic duct though evaluation is degraded secondary to patient respiratory artifact. Completion image demonstrates placement of a internal plastic biliary stent overlying expected location of the CBD. IMPRESSION: 1. ERCP with biliary stent placement as above. 2. Contrast injection demonstrates extravasation of contrast about the cholecystectomy clips as well as the cranial aspect of the surgically placed drain indicative of a biliary leak. The cystic duct was not definitely identified though evaluation is degraded secondary to patient respiration. Correlation with the operative report is recommended. These images were submitted for radiologic interpretation only. Please see the procedural report for the amount of contrast and the fluoroscopy time utilized. Electronically Signed   By: Simonne Come M.D.   On: 02/08/2015 14:56   Mr Abd W/wo Cm/mrcp  02/18/2015  CLINICAL DATA:  Status post laparoscopic  cholecystectomy on 01/27/2015, bile leak via surgical wound, pain/tenderness EXAM: MRI ABDOMEN WITHOUT AND WITH CONTRAST (INCLUDING MRCP) TECHNIQUE: Multiplanar multisequence MR imaging of the abdomen was performed both before and after the administration of intravenous contrast. Heavily T2-weighted images of the biliary and pancreatic ducts were obtained, and three-dimensional MRCP images were rendered by post processing. CONTRAST:  12mL MULTIHANCE GADOBENATE DIMEGLUMINE 529 MG/ML IV SOLN COMPARISON:  Nuclear medicine hepatobiliary scan dated 02/18/2015. CT abdomen pelvis dated 02/09/2015. ERCP dated 02/08/2015. FINDINGS:  Lower chest: Mild patchy opacity at the left lung base (series 7/image 6). Cardiomegaly. Hepatobiliary: Liver is notable for mild hepatic steatosis. No suspicious/enhancing hepatic lesions. Mild increased perfusion along the gallbladder fossa. Status post cholecystectomy. No intrahepatic or extrahepatic ductal dilatation. Common duct measures 5 mm (series 6/image 24). No choledocholithiasis is seen. 1.6 x 2.6 cm complex fluid collection with layering fluid-fluid level in the gallbladder fossa (series 8/image 21), likely reflecting a biloma. This collection is adjacent to susceptibility artifact from a surgical clips (series 8/image 20), likely corresponding to the known bile leak at the cystic duct remnant/stump. Direct drainage of the collection to the skin surface via a catheter tract along the right anterior abdomen and exiting the right lateral abdominal wall (series 8/images 24, 27, and 35). Pancreas: Within normal limits. Spleen: Within normal limits. Adrenals/Urinary Tract: Adrenal glands are within normal limits. Kidneys within normal limits.  No hydronephrosis. Stomach/Bowel: Stomach is within normal limits. Visualized bowel is unremarkable. Vascular/Lymphatic: No evidence of abdominal aortic aneurysm. No suspicious abdominal lymphadenopathy. Other: No abdominal ascites. Musculoskeletal:  No focal osseous lesions. IMPRESSION: Status post cholecystectomy. No intrahepatic or extrahepatic ductal dilatation. Common duct measures 5 mm. No choledocholithiasis is seen. 1.6 x 2.6 cm complex fluid collection/biloma in the gallbladder fossa, likely corresponding to the known bile leak at the cystic duct remnant/stump. Drug drainage of the collection to the skin surface deviated a catheter tract along the right anterior abdomen and exiting the right lateral abdominal wall. Additional ancillary findings as above. Electronically Signed   By: Charline Bills M.D.   On: 02/18/2015 19:43  [2 weeks]   Assessment: 59 y/o female with a complex history of cholecystectomy for gangrenous gallbladder with subsequent postop bile leak. She began having increased output from her JP drain and was brought in for an ERCP and stent placement on 02/08/15. The drain was removed by surgeon at outpatient visit and two days later she was admitted at [facility] for abdominal pain and persistent bilious drainage from the drain site. HIDA was positive for persistent leak 02/18/15 and they were considering a repeat ERCP and stent change out however she developed myocardial ischemia on EKG. By the time cardiology was able to clear her for ERCP she was clinically improving and subsequently discharged home. At some point between discharge and presentation today her abdominal pain began worsening. She is also having nausea/vomiting. Her pain is severe, especially worse on palpation to the right upper quadrant. Pain wakes her at night and she ends up sitting in a recliner all night. Worse with eating, afraid to eat currently.  Repeat HIDA yesterday, negative for a leak. CT with improving fluid collection in GB bed, biliary system remains decompressed s/p biliary stenting. Probable small gallstones in the subhepatic space again noted. LFTs are normal.    History of PUD in 04/2014 per patient.  Plan: discussed with Dr. Jena Gauss 1. Given  history of myocardial ischemia recently, we will check troponins.  2. Request Dr. Lovell Sheehan to come evaluate patient for ongoing abdominal pain s/p cholecystectomy and his opinion regarding fluid collecting on CT (although improving).  3. No plans to remove biliary stent at this time as it appears to be in good location, draining well per CT and normal LFTs. 4. Patient may require EGD for ongoing pain, await troponins.  5. Will change protonix to IV given limited oral intake.  Leanna Battles. Dixon Boos [facility] [phone] 11/17/201611:30 AM     LOS: 1 day    Addendum:  Discussed with Dr. Lovell Sheehan. He will come by to see her today.

## 2015-03-09 NOTE — Care Management Note (Signed)
Case Management Note  Patient Details  Name: Sheila [name] MRN: [id number] Date of Birth: [birth year]  Subjective/Objective:                  Pt admitted from home with abd pain. Pt lives alone and will return home at discharge. Pt is independent with ADL's.  Action/Plan: No CM needs anticipated.  Expected Discharge Date:                  Expected Discharge Plan:  Home/Self Care  In-House Referral:  NA  Discharge planning Services  CM Consult  Post Acute Care Choice:  NA Choice offered to:  NA  DME Arranged:    DME Agency:     HH Arranged:    HH Agency:     Status of Service:  Completed, signed off  Medicare Important Message Given:    Date Medicare IM Given:    Medicare IM give by:    Date Additional Medicare IM Given:    Additional Medicare Important Message give by:     If discussed at Long Length of Stay Meetings, dates discussed:    Additional Comments:  [name], [name], RN 03/09/2015, 2:11 PM

## 2015-03-09 NOTE — Progress Notes (Signed)
Nutrition Follow-up  DOCUMENTATION CODES:  Severe malnutrition in the context of acute illness/injury    INTERVENTION:  Currently Boost Breeze po TID, each supplement provides 250 kcal and 9 grams of protein   When pt is appropriate for full liquids transition to provide Ensure Enlive po TID, each supplement provides 350 kcal and 20 grams of protein.  NUTRITION DIAGNOSIS:     Inadequate oral intake related to   poor appetite; acute illness; ongoing as evidenced by pt diet hx and clear liquids   GOAL: Pt to meet >/= 90% of their estimated nutrition needs ;not met    MONITOR: diet advancement/tolerance,  Po intake, labs and wt trends    REASON FOR ASSESSMENT: Malnutrition Screen      ASSESSMENT: Pt c/o abdominal pain. She is s/p cholecystectomy due to gangrenous gallbladder. She recently had biliary stent placed and was discharged from [facility] but has continued to have abdominal pain.  Her po intake has been very limited due to symptoms of nausea associated with food intake. Pt also had episode of vomiting this morning with CT contrast. According to hospital record her weight is down 6% in 2 weeks which is severe unintentional loss. She has malnutrition and is at risk for further decline nutritionally due to her inability to meet her nutrition needs orally. If she continues to be unable to tolerate po's recommend consider post-pyloric tube placement and initiation of enteral feeding.  Labs: glucose 123 mg/dl  IVF-NS with KCL @ 100 ml/hr  CT 11/16-abdomen and pelvis-no new findings - recent biliary stent in proper position per MD  Diet Order:  Diet clear liquid Room service appropriate?: Yes; Fluid consistency:: Thin Diet NPO time specified Except for: Sips with Meds  Skin:   closed incision  Last BM:   11/15   Height:   Ht Readings from Last 1 Encounters:  03/08/15 5' (1.524 m)    Weight:   Wt Readings from Last 1 Encounters:  03/08/15 121 lb 3.2 oz (54.976 kg)     Ideal  Body Weight:   45.4 kg  BMI:  Body mass index is 23.67 kg/(m^2).  Estimated Nutritional Needs:   Kcal:   1700-1900  Protein:   80-95 gr  Fluid:   1.7-1.8 liters daily  EDUCATION NEEDS:  None identified at this time    Colman Cater MS,RD,CSG,LDN Office: #[phone] Pager: [phone]

## 2015-03-09 NOTE — Progress Notes (Signed)
[facility] PROGRESS NOTE  [name] Aguinaga ZOX:[id number] DOB: 09/16/1955 DOA: 03/08/2015 PCP: Geraldo PitterBLAND,VEITA J, MD  Assessment/Plan: 1. Epigastric abdominal pain. Etiology is unclear. HIDA scan did not show any evidence of biliary leak. Further imaging with CT scan showed significant improvement from prior. GI is following and will perform EGD tomorrow. Will continue on clear liquids for now. Due to patient's hx of CAD, troponins will be cycled. EKG does not show any acute changes. 2. Hypertension, stable. Continue outpatient regimen. 3. Hypokalemia. Likely related to decreased by mouth intake. Repleted. Magnesium is normal. 4. Dehydration, improving. Will continue with IV fluids. 5. Coronary artery disease with elevated troponin. No complaint of chest pain at this time. EKG is reassuring. Recently followed up with cardiology and was cleared for further workup of abdominal pain. They will not pursue cardiac cath until abdominal workup has been completed since she will likely need antiplatelet agents which would prevent any GI interventions. Will cycle cardiac markers, troponin is minimally elevated at this point. Continue to trend.    Code Status: full code DVT Prophylaxis: heparin Family Communication: Discussed with patient who understands and has no concerns at this time. Disposition Plan: Discharge when improved.   Consultants:  Surgery- Dr. Lovell SheehanJenkins  GI- Dr. Jena Gaussourk  Procedures:    Antibiotics:    HPI/Subjective: Feels better after the pain meds but still has epigastric pain.   Objective: Filed Vitals:   03/09/15 0536  BP: 133/89  Pulse: 88  Temp: 98.2 F (36.8 C)  Resp: 18    Intake/Output Summary (Last 24 hours) at 03/09/15 0734 Last data filed at 03/09/15 [id number]  Gross per 24 hour  Intake 1471.67 ml  Output    250 ml  Net 1221.67 ml   Filed Weights   03/08/15 1308  Weight: 54.976 kg (121 lb 3.2 oz)    Exam:  General: NAD, looks  comfortable Cardiovascular: RRR, S1, S2  Respiratory: clear bilaterally, No wheezing, rales or rhonchi Abdomen: soft, TTP in epigastrium, no distention , bowel sounds normal Musculoskeletal: No edema b/l   Data Reviewed: Basic Metabolic Panel:  Recent Labs Lab 03/08/15 1331 03/09/15 0614  NA 141 144  K 2.4* 4.3  CL 97* 111  CO2 29 28  GLUCOSE 125* 123*  BUN 13 12  CREATININE 0.79 0.54  CALCIUM 9.9 9.2  MG 2.1  --    Liver Function Tests:  Recent Labs Lab 03/08/15 1331 03/09/15 0614  AST 39 31  ALT 32 27  ALKPHOS 116 95  BILITOT 0.7 0.5  PROT 8.4* 6.9  ALBUMIN 4.1 3.3*    Recent Labs Lab 03/08/15 1331  LIPASE 55*   CBC:  Recent Labs Lab 03/08/15 1331 03/09/15 0614  WBC 7.8 9.9  HGB 12.2 10.2*  HCT 38.7 33.4*  MCV 93.0 96.0  PLT 643* 539*     Studies: Nm Hepatobiliary Liver Func  03/08/2015  CLINICAL DATA:  Bile leak post cholecystectomy, underwent CBD stenting, continued pain, question continued bile leak EXAM: NUCLEAR MEDICINE HEPATOBILIARY IMAGING TECHNIQUE: Sequential images of the abdomen were obtained out to 90 minutes following intravenous administration of radiopharmaceutical. RADIOPHARMACEUTICALS:  5 mCi Tc-6164m  Choletec IV COMPARISON:  02/18/2015 FINDINGS: Normal tracer extraction from bloodstream indicating normal hepatocellular function. Prompt excretion of tracer into biliary tree. Small bowel visualized by 18 minutes. Gallbladder surgically absent. No abnormal localization of tracer identified to suggest bile leak. Specifically, abnormal infrahepatic collection of tracer seen on the previous exam is no longer identified. Small amount of duodenogastric reflux  of tracer is noted. IMPRESSION: Patent biliary tree. No evidence of bile leak. Small amount of duodenogastric reflux of tracer is incidentally noted. Electronically Signed   By: Ulyses Southward M.D.   On: 03/08/2015 16:27   Ct Abdomen Pelvis W Contrast  03/08/2015  CLINICAL DATA:  Upper  abdominal pain with nausea since cholecystectomy approximately 5 weeks ago. Weight loss. EXAM: CT ABDOMEN AND PELVIS WITH CONTRAST TECHNIQUE: Multidetector CT imaging of the abdomen and pelvis was performed using the standard protocol following bolus administration of intravenous contrast. CONTRAST:  OMNIPAQUE IOHEXOL 300 MG/ML  SOLN COMPARISON:  Abdominal pelvic CT 02/17/2015.  MRCP 02/18/2015. FINDINGS: Lower chest: Clear lung bases. No significant pleural or pericardial effusion. There is a small hiatal hernia. Hepatobiliary: Plastic biliary stent remains well positioned. The biliary system is decompressed. Peripherally enhancing fluid collection within the cholecystectomy bed measures 1.6 cm on image 18, improved from the MRCP. The subhepatic sinus tract extending into the right anterior abdominal wall appears less evident. No new or enlarging intra abdominal fluid collections are demonstrated. There are probable small spilled gallstones in the subhepatic space as noted previously (axial images 21 and 22). The remainder of the liver appears unremarkable. Pancreas: Unremarkable. No pancreatic ductal dilatation or surrounding inflammatory changes. Spleen: Stable in appearance without suspicious findings. Adrenals/Urinary Tract: Both adrenal glands appear normal. The kidneys appear normal without evidence of urinary tract calculus, suspicious lesion or hydronephrosis. No bladder abnormalities are seen. Stomach/Bowel: No evidence of bowel wall thickening, distention or surrounding inflammatory change. Vascular/Lymphatic: There are no enlarged abdominal or pelvic lymph nodes. Moderate atherosclerosis of the aorta, its branches and the iliac arteries again noted. No evidence of large vessel occlusion. Reproductive: Hysterectomy.  No adnexal mass. Other: Postsurgical changes within the anterior abdominal wall without definite residual fistula. Musculoskeletal: No acute or significant osseous findings. IMPRESSION:  1. Improving fluid collection in the cholecystectomy bed status post recent cholecystectomy complicated by bile leak. No new or enlarging fluid collections identified. Probable small gallstones in the subhepatic space again noted. 2. The biliary system remains decompressed status post biliary stenting. 3. No new findings identified. Electronically Signed   By: Carey Bullocks M.D.   On: 03/08/2015 19:16    Scheduled Meds: . amLODipine  10 mg Oral Daily  . cloNIDine  0.2 mg Oral BID  . escitalopram  10 mg Oral Daily  . feeding supplement (ENSURE ENLIVE)  237 mL Oral TID BM  . heparin  5,000 Units Subcutaneous 3 times per day  . isosorbide mononitrate  30 mg Oral Daily  . metoprolol  50 mg Oral TID  . pantoprazole  40 mg Oral Daily  . sodium chloride  3 mL Intravenous Q12H   Continuous Infusions: . 0.9 % NaCl with KCl 40 mEq / L 100 mL/hr (03/09/15 0456)    Active Problems:   CAD (coronary artery disease)   HTN (hypertension)   Dehydration   Hypokalemia   Abdominal pain   Nausea without vomiting    Time spent:   Jehanzeb Memon. MD [facility] Pager [phone]. If 7PM-7AM, please contact night-coverage at [email or web address], password [facility] 03/09/2015, 7:34 AM  LOS: 1 day     By signing my name below, I, Burnett Harry, attest that this documentation has been prepared under the direction and in the presence of [facility]. MD Electronically Signed: Burnett Harry, Scribe. 03/09/2015 1:57pm  I, Dr. Erick Blinks, personally performed the services described in this documentaiton. All medical record entries made by the  scribe were at my direction and in my presence. I have reviewed the chart and agree that the record reflects my personal performance and is accurate and complete  Erick Blinks, MD, 03/09/2015 2:01 PM

## 2015-03-09 NOTE — Consult Note (Signed)
Reason for Consult: Epigastric pain Referring Physician: Hospitalist, Dr. Gala Romney  Sheila Sheila [name] is an 59 y.o. female.  HPI: Patient is Sheila [name] 59 year old black female status post an open cholecystectomy for gangrene of the gallbladder whose postoperative course was complicated by Sheila [name] biliary leak. This was stented in the past. She continued to have problems requiring admission to [facility]. Workup there revealed coronary artery disease, but her HIDAscan was negative for leak and CT scan the abdomen revealed some or gallstones present in the abdomen, but no fluid collection requiring drainage. She presented back to GI due to epigastric pain. She was made to the hospital for further evaluation treatment. Repeat HIDA scan is negative and her CT scan is improved. Again, no intra-abdominal abscess or fluid collection is present. She states her epigastric pain is similar to what she had preoperatively. She states she has Sheila [name] history of peptic ulcer disease. She gets nauseated soon after eating.  Past Medical History  Diagnosis Date  . Myocardial infarction ([name]) 2012   . CVD (cardiovascular disease)     Stent to mid RCA 2012 [name] 2.5 X 12 mm  . Systemic lupus ([name]   . Anxiety   . Hypertension   . Arthritis   . Hypercholesterolemia   . PUD (peptic ulcer disease)     Dr. in [place] in early 2016    Past Surgical History  Procedure Laterality Date  . Coronary angioplasty with stent placement  2012, 2013.    [name] DES to mid RCA. Stents X 2 to unknown artery  . Abdominal hysterectomy    . Tubal ligation    . Breast lumpectomy    . Cholecystectomy N/Sheila [name] 01/27/2015    Procedure: CHOLECYSTECTOMY;  Surgeon: [name], MD;  Location: [facility];  Service: General;  Laterality: N/Sheila [name];  converted to open at 0941  . Esophagogastroduodenoscopy      [place]:PUD per patient  . Colonoscopy  2008    [facility], normal.   . Ercp N/Sheila [name] 02/08/2015    [id number] biliary leak/s/p stent placement  . Biliary stent placement  N/Sheila [name] 02/08/2015    Procedure: BILIARY STENT PLACEMENT;  Surgeon: [name], MD;  Location: [facility];  Service: Endoscopy;  Laterality: N/Sheila [name];  . Sphincterotomy N/Sheila [name] 02/08/2015    Procedure: SPHINCTEROTOMY;  Surgeon: [name], MD;  Location: [facility];  Service: Endoscopy;  Laterality: N/Sheila [name];    Family History  Problem Relation Age of Onset  . CAD Father     CABG  . Heart attack Brother     PTCA  . Hypertension Sister   . Hypertension Father   . Colon cancer Neg Hx   . Liver disease Neg Hx     Social History:  reports that she has quit smoking. Her smoking use included Cigarettes. She has never used smokeless tobacco. She reports that she does not drink alcohol or use illicit drugs.  Allergies:  Allergies  Allergen Reactions  . Hydrocodone-Acetaminophen Nausea And Vomiting    Medications: I have reviewed the patient's current medications.  Results for orders placed or performed during the hospital encounter of 03/08/15 (from the past 48 hour(s))  CBC     Status: Abnormal   Collection Time: 03/08/15  1:31 PM  Result Value Ref Range   WBC 7.8 4.0 - 10.5 K/uL   RBC 4.16 3.87 - 5.11 MIL/uL   Hemoglobin 12.2 12.0 - 15.0 g/dL   HCT 38.7 36.0 - 46.0 %   MCV 93.0 78.0 - 100.0 fL  MCH 29.3 26.0 - 34.0 pg   MCHC 31.5 30.0 - 36.0 g/dL   RDW 15.0 11.5 - 15.5 %   Platelets 643 (H) 150 - 400 K/uL  Comprehensive metabolic panel     Status: Abnormal   Collection Time: 03/08/15  1:31 PM  Result Value Ref Range   Sodium 141 135 - 145 mmol/L   Potassium 2.4 (LL) 3.5 - 5.1 mmol/L    Comment: CRITICAL RESULT CALLED TO, READ BACK BY AND VERIFIED WITH: HOLBROOK,B AT 1410 BY HUFFINES,S ON 03/08/15.    Chloride 97 (L) 101 - 111 mmol/L   CO2 29 22 - 32 mmol/L   Glucose, Bld 125 (H) 65 - 99 mg/dL   BUN 13 6 - 20 mg/dL   Creatinine, Ser 0.79 0.44 - 1.00 mg/dL   Calcium 9.9 8.9 - 10.3 mg/dL   Total Protein 8.4 (H) 6.5 - 8.1 g/dL   Albumin 4.1 3.5 - 5.0 g/dL   AST 39 15 - 41 U/L   ALT 32  14 - 54 U/L   Alkaline Phosphatase 116 38 - 126 U/L   Total Bilirubin 0.7 0.3 - 1.2 mg/dL   GFR calc non Af Amer >60 >60 mL/min   GFR calc Af Amer >60 >60 mL/min    Comment: (NOTE) The eGFR has been calculated using the CKD EPI equation. This calculation has not been validated in all clinical situations. eGFR's persistently <60 mL/min signify possible Chronic Kidney Disease.    Anion gap 15 5 - 15  Lipase, blood     Status: Abnormal   Collection Time: 03/08/15  1:31 PM  Result Value Ref Range   Lipase 55 (H) 11 - 51 U/L  Magnesium     Status: None   Collection Time: 03/08/15  1:31 PM  Result Value Ref Range   Magnesium 2.1 1.7 - 2.4 mg/dL  Comprehensive metabolic panel     Status: Abnormal   Collection Time: 03/09/15  6:14 AM  Result Value Ref Range   Sodium 144 135 - 145 mmol/L   Potassium 4.3 3.5 - 5.1 mmol/L    Comment: DELTA CHECK NOTED   Chloride 111 101 - 111 mmol/L   CO2 28 22 - 32 mmol/L   Glucose, Bld 123 (H) 65 - 99 mg/dL   BUN 12 6 - 20 mg/dL   Creatinine, Ser 0.54 0.44 - 1.00 mg/dL   Calcium 9.2 8.9 - 10.3 mg/dL   Total Protein 6.9 6.5 - 8.1 g/dL   Albumin 3.3 (L) 3.5 - 5.0 g/dL   AST 31 15 - 41 U/L   ALT 27 14 - 54 U/L   Alkaline Phosphatase 95 38 - 126 U/L   Total Bilirubin 0.5 0.3 - 1.2 mg/dL   GFR calc non Af Amer >60 >60 mL/min   GFR calc Af Amer >60 >60 mL/min    Comment: (NOTE) The eGFR has been calculated using the CKD EPI equation. This calculation has not been validated in all clinical situations. eGFR's persistently <60 mL/min signify possible Chronic Kidney Disease.    Anion gap 5 5 - 15  CBC     Status: Abnormal   Collection Time: 03/09/15  6:14 AM  Result Value Ref Range   WBC 9.9 4.0 - 10.5 K/uL   RBC 3.48 (L) 3.87 - 5.11 MIL/uL   Hemoglobin 10.2 (L) 12.0 - 15.0 g/dL   HCT 33.4 (L) 36.0 - 46.0 %   MCV 96.0 78.0 - 100.0 fL   MCH 29.3 26.0 -  34.0 pg   MCHC 30.5 30.0 - 36.0 g/dL   RDW 15.1 11.5 - 15.5 %   Platelets 539 (H) 150 - 400  K/uL  Troponin I (q 6hr x 3)     Status: Abnormal   Collection Time: 03/09/15 10:49 AM  Result Value Ref Range   Troponin I 0.05 (H) <0.031 ng/mL    Comment:        PERSISTENTLY INCREASED TROPONIN VALUES IN THE RANGE OF 0.04-0.49 ng/mL CAN BE SEEN IN:       -UNSTABLE ANGINA       -CONGESTIVE HEART FAILURE       -MYOCARDITIS       -CHEST TRAUMA       -ARRYHTHMIAS       -LATE PRESENTING MYOCARDIAL INFARCTION       -COPD   CLINICAL FOLLOW-UP RECOMMENDED.     Nm Hepatobiliary Liver Func  03/08/2015  CLINICAL DATA:  Bile leak post cholecystectomy, underwent CBD stenting, continued pain, question continued bile leak EXAM: NUCLEAR MEDICINE HEPATOBILIARY IMAGING TECHNIQUE: Sequential images of the abdomen were obtained out to 90 minutes following intravenous administration of radiopharmaceutical. RADIOPHARMACEUTICALS:  5 mCi Tc-55m Choletec IV COMPARISON:  02/18/2015 FINDINGS: Normal tracer extraction from bloodstream indicating normal hepatocellular function. Prompt excretion of tracer into biliary tree. Small bowel visualized by 18 minutes. Gallbladder surgically absent. No abnormal localization of tracer identified to suggest bile leak. Specifically, abnormal infrahepatic collection of tracer seen on the previous exam is no longer identified. Small amount of duodenogastric reflux of tracer is noted. IMPRESSION: Patent biliary tree. No evidence of bile leak. Small amount of duodenogastric reflux of tracer is incidentally noted. Electronically Signed   By: MLavonia DanaM.D.   On: 03/08/2015 16:27   Ct Abdomen Pelvis W Contrast  03/08/2015  CLINICAL DATA:  Upper abdominal pain with nausea since cholecystectomy approximately 5 weeks ago. Weight loss. EXAM: CT ABDOMEN AND PELVIS WITH CONTRAST TECHNIQUE: Multidetector CT imaging of the abdomen and pelvis was performed using the standard protocol following bolus administration of intravenous contrast. CONTRAST:  1080mOMNIPAQUE IOHEXOL 300 MG/ML  SOLN  COMPARISON:  Abdominal pelvic CT 02/17/2015.  MRCP 02/18/2015. FINDINGS: Lower chest: Clear lung bases. No significant pleural or pericardial effusion. There is Sheila [name] small hiatal hernia. Hepatobiliary: Plastic biliary stent remains well positioned. The biliary system is decompressed. Peripherally enhancing fluid collection within the cholecystectomy bed measures 1.6 cm on image 18, improved from the MRCP. The subhepatic sinus tract extending into the right anterior abdominal wall appears less evident. No new or enlarging intra abdominal fluid collections are demonstrated. There are probable small spilled gallstones in the subhepatic space as noted previously (axial images 21 and 22). The remainder of the liver appears unremarkable. Pancreas: Unremarkable. No pancreatic ductal dilatation or surrounding inflammatory changes. Spleen: Stable in appearance without suspicious findings. Adrenals/Urinary Tract: Both adrenal glands appear normal. The kidneys appear normal without evidence of urinary tract calculus, suspicious lesion or hydronephrosis. No bladder abnormalities are seen. Stomach/Bowel: No evidence of bowel wall thickening, distention or surrounding inflammatory change. Vascular/Lymphatic: There are no enlarged abdominal or pelvic lymph nodes. Moderate atherosclerosis of the aorta, its branches and the iliac arteries again noted. No evidence of large vessel occlusion. Reproductive: Hysterectomy.  No adnexal mass. Other: Postsurgical changes within the anterior abdominal wall without definite residual fistula. Musculoskeletal: No acute or significant osseous findings. IMPRESSION: 1. Improving fluid collection in the cholecystectomy bed status post recent cholecystectomy complicated by bile leak. No new or enlarging  fluid collections identified. Probable small gallstones in the subhepatic space again noted. 2. The biliary system remains decompressed status post biliary stenting. 3. No new findings identified.  Electronically Signed   By: Richardean Sale M.D.   On: 03/08/2015 19:16    ROS: See chart Blood pressure 133/89, pulse 88, temperature 98.2 F (36.8 C), temperature source Oral, resp. rate 18, height 5' (1.524 m), weight 54.976 kg (121 lb 3.2 oz), SpO2 95 %. Physical Exam: 21 black female in no acute distress. Abdomen is soft. Her incision line has healed well and she has no point tenderness in the right upper quadrant. She describes an none specific dental ache in the epigastric region. No rigidity is noted.  Assessment/Plan: Impression: Epigastric pain of unknown etiology. This does not seem to be related to her incision or her previous surgery. I do not have an explanation for her symptomatology. It is comforting to see that the bile leak has resolved and no intra-abdominal intervention is warranted. She may benefit from an EGD. Will discuss with Dr. Gala Romney.  Sheila Sheila [name] 03/09/2015, 12:30 PM

## 2015-03-09 NOTE — Progress Notes (Signed)
In preparation for possible EGD tomorrow, last dose of heparin [place] this evening. Will resume as appropriate post procedure.

## 2015-03-10 ENCOUNTER — Encounter (HOSPITAL_COMMUNITY)
Admission: AD | Disposition: A | Discharge status: Home / Self Care | Payer: Self-pay | Source: Ambulatory Visit | Attending: Internal Medicine

## 2015-03-10 ENCOUNTER — Encounter (HOSPITAL_COMMUNITY): Payer: Self-pay | Admitting: *Deleted

## 2015-03-10 HISTORY — PX: ESOPHAGOGASTRODUODENOSCOPY: SHX5428

## 2015-03-10 LAB — CBC
HEMATOCRIT: 31.3 % — AB (ref 36.0–46.0)
HEMOGLOBIN: 9.5 g/dL — AB (ref 12.0–15.0)
MCH: 29.5 pg (ref 26.0–34.0)
MCHC: 30.4 g/dL (ref 30.0–36.0)
MCV: 97.2 fL (ref 78.0–100.0)
Platelets: 482 10*3/uL — ABNORMAL HIGH (ref 150–400)
RBC: 3.22 MIL/uL — ABNORMAL LOW (ref 3.87–5.11)
RDW: 15.2 % (ref 11.5–15.5)
WBC: 12.2 10*3/uL — ABNORMAL HIGH (ref 4.0–10.5)

## 2015-03-10 LAB — BASIC METABOLIC PANEL
Anion gap: 6 (ref 5–15)
BUN: 8 mg/dL (ref 6–20)
CHLORIDE: 111 mmol/L (ref 101–111)
CO2: 25 mmol/L (ref 22–32)
CREATININE: 0.54 mg/dL (ref 0.44–1.00)
Calcium: 9.3 mg/dL (ref 8.9–10.3)
GFR calc non Af Amer: >60 mL/min (ref >60)
GLUCOSE: 114 mg/dL — AB (ref 65–99)
Potassium: 4.5 mmol/L (ref 3.5–5.1)
Sodium: 142 mmol/L (ref 135–145)

## 2015-03-10 SURGERY — EGD (ESOPHAGOGASTRODUODENOSCOPY)
Anesthesia: Moderate Sedation

## 2015-03-10 MED ORDER — SODIUM CHLORIDE 0.9 % IV SOLN
INTRAVENOUS | Status: DC
  Administered 2015-03-10: 09:00:00 via INTRAVENOUS

## 2015-03-10 MED ORDER — PANTOPRAZOLE SODIUM 40 MG IV SOLR: 40.0000 mg | Freq: Two times a day (BID) | INTRAVENOUS | Status: DC

## 2015-03-10 MED ORDER — ONDANSETRON HCL 4 MG/2ML IJ SOLN
4.0000 mg | Freq: Three times a day (TID) | INTRAMUSCULAR | Status: DC
  Administered 2015-03-10 – 2015-03-11 (×6): 4 mg via INTRAVENOUS
  Filled 2015-03-10 (×3): qty 2

## 2015-03-10 MED ORDER — MEPERIDINE HCL 100 MG/ML IJ SOLN
INTRAMUSCULAR | Status: AC
  Filled 2015-03-10: qty 2

## 2015-03-10 MED ORDER — ENSURE ENLIVE PO LIQD
237.0000 mL | Freq: Three times a day (TID) | ORAL | Status: DC
  Administered 2015-03-11 – 2015-03-12 (×5): 237 mL via ORAL

## 2015-03-10 MED ORDER — MIDAZOLAM HCL 5 MG/5ML IJ SOLN
INTRAMUSCULAR | Status: DC | PRN
  Administered 2015-03-10: 1 mg via INTRAVENOUS
  Administered 2015-03-10: 2 mg via INTRAVENOUS
  Administered 2015-03-10: 1 mg via INTRAVENOUS
  Administered 2015-03-10: 2 mg via INTRAVENOUS

## 2015-03-10 MED ORDER — GI COCKTAIL ~~LOC~~
30.0000 mL | Freq: Three times a day (TID) | ORAL | Status: DC
  Administered 2015-03-10 – 2015-03-12 (×6): 30 mL via ORAL
  Filled 2015-03-10 (×5): qty 30

## 2015-03-10 MED ORDER — ONDANSETRON HCL 4 MG/2ML IJ SOLN
4.0000 mg | Freq: Three times a day (TID) | INTRAMUSCULAR | Status: DC
  Administered 2015-03-10 – 2015-03-12 (×3): 4 mg via INTRAVENOUS
  Filled 2015-03-10 (×5): qty 2

## 2015-03-10 MED ORDER — LIDOCAINE VISCOUS 2 % MT SOLN
OROMUCOSAL | Status: AC
  Filled 2015-03-10: qty 15

## 2015-03-10 MED ORDER — MEPERIDINE HCL 100 MG/ML IJ SOLN
INTRAMUSCULAR | Status: DC | PRN
  Administered 2015-03-10: 25 mg via INTRAVENOUS
  Administered 2015-03-10: 50 mg via INTRAVENOUS
  Administered 2015-03-10: 25 mg via INTRAVENOUS

## 2015-03-10 MED ORDER — PANTOPRAZOLE SODIUM 40 MG PO TBEC
40.0000 mg | DELAYED_RELEASE_TABLET | Freq: Two times a day (BID) | ORAL | Status: DC
  Administered 2015-03-10 – 2015-03-12 (×4): 40 mg via ORAL
  Filled 2015-03-10 (×4): qty 1

## 2015-03-10 MED ORDER — ENSURE ENLIVE PO LIQD
237.0000 mL | Freq: Three times a day (TID) | ORAL | Status: DC
  Administered 2015-03-10: 237 mL via ORAL

## 2015-03-10 MED ORDER — LIDOCAINE VISCOUS 2 % MT SOLN
OROMUCOSAL | Status: DC | PRN
  Administered 2015-03-10: 1 via OROMUCOSAL

## 2015-03-10 MED ORDER — SODIUM CHLORIDE 0.9 % IJ SOLN
INTRAMUSCULAR | Status: AC
  Filled 2015-03-10: qty 3

## 2015-03-10 MED ORDER — STERILE WATER FOR IRRIGATION IR SOLN
Status: DC | PRN
  Administered 2015-03-10: 10:00:00

## 2015-03-10 MED ORDER — MIDAZOLAM HCL 5 MG/5ML IJ SOLN
INTRAMUSCULAR | Status: AC
  Filled 2015-03-10: qty 10

## 2015-03-10 NOTE — Care Management Note (Signed)
Case Management Note  Patient Details  Name: Sheila [name] MRN: [id number] Date of Birth: 1955-11-22  Subjective/Objective:                    Action/Plan:   Expected Discharge Date:                  Expected Discharge Plan:  Home/Self Care  In-House Referral:  NA  Discharge planning Services  CM Consult  Post Acute Care Choice:  NA Choice offered to:  NA  DME Arranged:    DME Agency:     HH Arranged:    HH Agency:     Status of Service:  Completed, signed off  Medicare Important Message Given:  Yes Date Medicare IM Given:    Medicare IM give by:    Date Additional Medicare IM Given:    Additional Medicare Important Message give by:     If discussed at Long Length of Stay Meetings, dates discussed:    Additional Comments: Pt having egd today. Anticipate discharge over the weekend. No CM needs anticipated. Sheila [name], Sheila Russman [place], RN 03/10/2015, 10:19 AM

## 2015-03-10 NOTE — Care Management Important Message (Signed)
Important Message  Patient Details  Name: [name] Pope MRN: [id number] Date of Birth: [birth year]   Medicare Important Message Given:  Yes    [name], Lewi Drost Crowder, RN 03/10/2015, 10:18 AM

## 2015-03-10 NOTE — H&P (Signed)
Primary Care Physician:  Geraldo PitterBLAND,VEITA J, MD Primary Gastroenterologist:  Dr. Darrick PennaFields  Pre-Procedure History & Physical: HPI:  Sheila [name] is a 59 y.o. female here for ABDOMINAL PAIN/DYSPEPSIA.  Past Medical History  Diagnosis Date  . Myocardial infarction (HCC) 2012   . CVD (cardiovascular disease)     Stent to mid RCA 2012 [name] 2.5 X 12 mm  . Systemic lupus (HCC)   . Anxiety   . Hypertension   . Arthritis   . Hypercholesterolemia   . PUD (peptic ulcer disease)     Dr. in [place] in early 2016    Past Surgical History  Procedure Laterality Date  . Coronary angioplasty with stent placement  2012, 2013.    [name] DES to mid RCA. Stents X 2 to unknown artery  . Abdominal hysterectomy    . Tubal ligation    . Breast lumpectomy    . Cholecystectomy N/A 01/27/2015    Procedure: CHOLECYSTECTOMY;  Surgeon: Franky MachoMark Jenkins, MD;  Location: [facility];  Service: General;  Laterality: N/A;  converted to open at 0941  . Esophagogastroduodenoscopy      [place]:PUD per patient  . Colonoscopy  2008    [facility], normal.   . Ercp N/A 02/08/2015    [facility]:XLKG-MWRMR:post-op biliary leak/s/p stent placement  . Biliary stent placement N/A 02/08/2015    Procedure: BILIARY STENT PLACEMENT;  Surgeon: Corbin Adeobert M Rourk, MD;  Location: [facility];  Service: Endoscopy;  Laterality: N/A;  . Sphincterotomy N/A 02/08/2015    Procedure: SPHINCTEROTOMY;  Surgeon: Corbin Adeobert M Rourk, MD;  Location: [facility];  Service: Endoscopy;  Laterality: N/A;    Prior to Admission medications   Medication Sig Start Date End Date Taking? Authorizing Provider  amLODipine (NORVASC) 10 MG tablet Take 1 tablet (10 mg total) by mouth daily. 03/07/15  Yes Jodelle GrossKathryn M Lawrence, NP  cloNIDine (CATAPRES) 0.2 MG tablet Take 1 tablet (0.2 mg total) by mouth 2 (two) times daily. 03/07/15  Yes Jodelle GrossKathryn M Lawrence, NP  escitalopram (LEXAPRO) 10 MG tablet Take 10 mg by mouth daily.    Yes Historical Provider, MD  isosorbide mononitrate (IMDUR) 30 MG 24 hr tablet Take  1 tablet (30 mg total) by mouth daily. 02/23/15  Yes Nishant Dhungel, MD  metoprolol (LOPRESSOR) 50 MG tablet Take 50 mg by mouth 3 (three) times daily.   Yes Historical Provider, MD  Multiple Vitamins-Calcium (ONE-A-DAY WOMENS FORMULA PO) Take 1 tablet by mouth daily.   Yes Historical Provider, MD  naproxen sodium (ANAPROX) 220 MG tablet Take 220 mg by mouth 2 (two) times daily with a meal.   Yes Historical Provider, MD  omeprazole (PRILOSEC) 40 MG capsule Take 40 mg by mouth daily.   Yes Historical Provider, MD    Allergies as of 03/08/2015 - Review Complete 03/08/2015  Allergen Reaction Noted  . Hydrocodone-acetaminophen Nausea And Vomiting 02/17/2015    Family History  Problem Relation Age of Onset  . CAD Father     CABG  . Heart attack Brother     PTCA  . Hypertension Sister   . Hypertension Father   . Colon cancer Neg Hx   . Liver disease Neg Hx     Social History   Social History  . Marital Status: Legally Separated    Spouse Name: N/A  . Number of Children: 2  . Years of Education: N/A   Occupational History  . Nutritionist    Social History Main Topics  . Smoking status: Former Smoker  Types: Cigarettes  . Smokeless tobacco: Never Used  . Alcohol Use: No  . Drug Use: No  . Sexual Activity: No   Other Topics Concern  . Not on file   Social History Narrative   Lives alone. Moved from [place] 2 years ago.     Review of Systems: See HPI, otherwise negative ROS   Physical Exam: BP 130/72 mmHg  Pulse 80  Temp(Src) 99.4 F (37.4 C) (Oral)  Resp 13  Ht 5' (1.524 m)  Wt 121 lb (54.885 kg)  BMI 23.63 kg/m2  SpO2 99% General:   Alert,  pleasant and cooperative in NAD Head:  Normocephalic and atraumatic. Neck:  Supple; Lungs:  Clear throughout to auscultation.    Heart:  Regular rate and rhythm. Abdomen:  Soft, nontender and nondistended. Normal bowel sounds, without guarding, and without rebound.   Neurologic:  Alert and  oriented x4;  grossly  normal neurologically.  Impression/Plan:    ABDOMINAL PAIN/DYSPEPSIA.  PLAN: 1. EGD TODAY

## 2015-03-10 NOTE — Op Note (Addendum)
[facility] [address] [place], [id number]   ENDOSCOPY PROCEDURE REPORT  PATIENT: Sheila [name], Sheila  MR#: [id number] BIRTHDATE: [birth year] , 59  yrs. old GENDER: female  ENDOSCOPIST: West BaliSandi L Jaxon Mynhier, MD REFERRED [id number]:Veita Parke SimmersBland, M.D.  Roetta SessionsMichael Rourk, M.D. PROCEDURE DATE: 03/10/2015 PROCEDURE:   EGD w/ biopsy  INDICATIONS:nausea.   vomiting.   epigastric pain. MEDICATIONS: Demerol 100 mg IV and Versed 6 mg IV TOPICAL ANESTHETIC: ASA CLASS:  DESCRIPTION OF PROCEDURE:     Physical exam was performed.  Informed consent was obtained from the patient after explaining the benefits, risks, and alternatives to the procedure.  The patient was connected to the monitor and placed in the left lateral position.  Continuous oxygen was provided by nasal cannula and IV medicine administered through an indwelling cannula.  After administration of sedation, the patients esophagus was intubated and the [id number] ([id number]  endoscope was advanced under direct visualization to the second portion of the duodenum.  The scope was removed slowly by carefully examining the color, texture, anatomy, and integrity of the mucosa on the way out.  The patient was recovered in endoscopy and discharged home in satisfactory condition.  Estimated blood loss is zero unless otherwise noted in this procedure report.    ESOPHAGUS: There was a stricture.  The stricture was easily traversable.   STOMACH: Multiple large non-bleeding, clean-based, irregular shaped and deep ulcers with heaped up edges and surrounding edema were found in the gastric antrum, at the angularis, and in the cardia.  Biopsies were taken.   DUODENUM: The duodenal mucosa showed no abnormalities in the bulb and 2nd part of the duodenum.   BILE DUCT STENT SEEN IN LUMEN OF SECOND PORTION OF THE DUODENUM. COMPLICATIONS: There were no immediate complications.  ENDOSCOPIC IMPRESSION: 1.   PATENT ESOPHAGEAL stricture 2.   Multiple  large GASTRIC ulcers  in the gastric antrum, at the angularis, and in the cardia 3.   BILE DUCT stent SEEN IN SECOND PORTION OF THE DUODENUM  RECOMMENDATIONS: BID PROTONIX FOR 3 MOS THEN DAILY FULL LIQUID DIET ENSURE TID BM AWAIT BIOPSY ZOFRAN ATC AND HS  REPEAT EXAM:  eSigned:  West BaliSandi L March Joos, MD 03/10/2015 11:38 AMRevised: 03/10/2015 11:38 AM  CPT CODES: ICD CODES:  The ICD and CPT codes recommended by this software are interpretations from the data that the clinical staff has captured with the software.  The verification of the translation of this report to the ICD and CPT codes and modifiers is the sole responsibility of the health care institution and practicing physician where this report was generated.  [employer] will not be held responsible for the validity of the ICD and CPT codes included on this report.  AMA assumes no liability for data contained or not contained herein. CPT is a Publishing rights managerregistered trademark of the [employer].

## 2015-03-10 NOTE — Progress Notes (Signed)
[facility] PROGRESS NOTE  Sheila [name] AVW:[id number] DOB: May 28, 1955 DOA: 03/08/2015 PCP: [name], MD  Assessment/Plan: 1. Epigastric abdominal pain.  HIDA scan did not show any evidence of biliary leak. Further imaging with CT scan showed significant improvement from prior. GI is following and performed EGD today which revealed multiple large GASTRIC ulcers in the gastric antrum, at the angularis, and in the cardia as well as the bile duct stent seen in the second portion of the duodenum. Will increase PPI BID and add GI cocktail. Biopsy pending.  2. Hypertension, stable. Continue outpatient regimen 3. Hypokalemia. Likely related to decreased by mouth intake. Repleted. Magnesium is normal. 4. Dehydration, improving. Will continue with IV fluids. 5. Coronary artery disease with elevated troponin. No complaint of chest pain at this time. EKG is reassuring. Recently followed up with cardiology and was cleared for further workup of abdominal pain. They will not pursue cardiac cath until abdominal workup has been completed since she will likely need antiplatelet agents which would prevent any GI interventions. Troponins were cycled and noted to be minimally elevated. With no CP or EKG changes, this is likely insignificant.    Code Status: full code DVT Prophylaxis: heparin Family Communication: Husband at bedside. Discussed care plan with him. Disposition Plan: Discharge when improved.   Consultants:  Surgery- Dr. Lovell Sheehan  GI- Dr. Jena Gauss  Procedures:    Antibiotics:    HPI/Subjective: Patient sleepy, coming off the sedation from her EGD.   Objective: Filed Vitals:   03/10/15 0530  BP: 153/68  Pulse: 83  Temp: 98.9 F (37.2 C)  Resp: 18    Intake/Output Summary (Last 24 hours) at 03/10/15 0741 Last data filed at 03/09/15 1800  Gross per 24 hour  Intake    360 ml  Output    800 ml  Net   -440 ml   Filed Weights   03/08/15 1308  Weight: 54.976 kg (121 lb  3.2 oz)    Exam:  General: NAD, looks comfortable, somnolent Cardiovascular: RRR, S1, S2  Respiratory: clear bilaterally, No wheezing, rales or rhonchi Abdomen: soft, nontender, no distention , bowel sounds normal Musculoskeletal: No edema b/l   Data Reviewed: Basic Metabolic Panel:  Recent Labs Lab 03/08/15 1331 03/09/15 0614  NA 141 144  K 2.4* 4.3  CL 97* 111  CO2 29 28  GLUCOSE 125* 123*  BUN 13 12  CREATININE 0.79 0.54  CALCIUM 9.9 9.2  MG 2.1  --    Liver Function Tests:  Recent Labs Lab 03/08/15 1331 03/09/15 0614  AST 39 31  ALT 32 27  ALKPHOS 116 95  BILITOT 0.7 0.5  PROT 8.4* 6.9  ALBUMIN 4.1 3.3*    Recent Labs Lab 03/08/15 1331  LIPASE 55*   CBC:  Recent Labs Lab 03/08/15 1331 03/09/15 0614  WBC 7.8 9.9  HGB 12.2 10.2*  HCT 38.7 33.4*  MCV 93.0 96.0  PLT 643* 539*     Studies: Nm Hepatobiliary Liver Func  03/08/2015  CLINICAL DATA:  Bile leak post cholecystectomy, underwent CBD stenting, continued pain, question continued bile leak EXAM: NUCLEAR MEDICINE HEPATOBILIARY IMAGING TECHNIQUE: Sequential images of the abdomen were obtained out to 90 minutes following intravenous administration of radiopharmaceutical. RADIOPHARMACEUTICALS:  5 mCi Tc-69m  Choletec IV COMPARISON:  02/18/2015 FINDINGS: Normal tracer extraction from bloodstream indicating normal hepatocellular function. Prompt excretion of tracer into biliary tree. Small bowel visualized by 18 minutes. Gallbladder surgically absent. No abnormal localization of tracer identified to suggest bile leak. Specifically,  abnormal infrahepatic collection of tracer seen on the previous exam is no longer identified. Small amount of duodenogastric reflux of tracer is noted. IMPRESSION: Patent biliary tree. No evidence of bile leak. Small amount of duodenogastric reflux of tracer is incidentally noted. Electronically Signed   By: Ulyses Southward M.D.   On: 03/08/2015 16:27   Ct Abdomen Pelvis W  Contrast  03/08/2015  CLINICAL DATA:  Upper abdominal pain with nausea since cholecystectomy approximately 5 weeks ago. Weight loss. EXAM: CT ABDOMEN AND PELVIS WITH CONTRAST TECHNIQUE: Multidetector CT imaging of the abdomen and pelvis was performed using the standard protocol following bolus administration of intravenous contrast. CONTRAST:  OMNIPAQUE IOHEXOL 300 MG/ML  SOLN COMPARISON:  Abdominal pelvic CT 02/17/2015.  MRCP 02/18/2015. FINDINGS: Lower chest: Clear lung bases. No significant pleural or pericardial effusion. There is a small hiatal hernia. Hepatobiliary: Plastic biliary stent remains well positioned. The biliary system is decompressed. Peripherally enhancing fluid collection within the cholecystectomy bed measures 1.6 cm on image 18, improved from the MRCP. The subhepatic sinus tract extending into the right anterior abdominal wall appears less evident. No new or enlarging intra abdominal fluid collections are demonstrated. There are probable small spilled gallstones in the subhepatic space as noted previously (axial images 21 and 22). The remainder of the liver appears unremarkable. Pancreas: Unremarkable. No pancreatic ductal dilatation or surrounding inflammatory changes. Spleen: Stable in appearance without suspicious findings. Adrenals/Urinary Tract: Both adrenal glands appear normal. The kidneys appear normal without evidence of urinary tract calculus, suspicious lesion or hydronephrosis. No bladder abnormalities are seen. Stomach/Bowel: No evidence of bowel wall thickening, distention or surrounding inflammatory change. Vascular/Lymphatic: There are no enlarged abdominal or pelvic lymph nodes. Moderate atherosclerosis of the aorta, its branches and the iliac arteries again noted. No evidence of large vessel occlusion. Reproductive: Hysterectomy.  No adnexal mass. Other: Postsurgical changes within the anterior abdominal wall without definite residual fistula. Musculoskeletal: No  acute or significant osseous findings. IMPRESSION: 1. Improving fluid collection in the cholecystectomy bed status post recent cholecystectomy complicated by bile leak. No new or enlarging fluid collections identified. Probable small gallstones in the subhepatic space again noted. 2. The biliary system remains decompressed status post biliary stenting. 3. No new findings identified. Electronically Signed   By: Carey Bullocks M.D.   On: 03/08/2015 19:16    Scheduled Meds: . amLODipine  10 mg Oral Daily  . cloNIDine  0.2 mg Oral BID  . escitalopram  10 mg Oral Daily  . feeding supplement (ENSURE ENLIVE)  237 mL Oral TID BM  . isosorbide mononitrate  30 mg Oral Daily  . metoprolol  50 mg Oral TID  . pantoprazole (PROTONIX) IV  40 mg Intravenous Q24H  . sodium chloride  3 mL Intravenous Q12H   Continuous Infusions: . 0.9 % NaCl with KCl 40 mEq / L 100 mL/hr (03/09/15 1515)    Active Problems:   CAD (coronary artery disease)   HTN (hypertension)   Dehydration   Hypokalemia   Abdominal pain   Nausea without vomiting   Nausea with vomiting    Time spent:   Jehanzeb Memon. MD [facility] Pager [phone]. If 7PM-7AM, please contact night-coverage at [email or web address], password [facility] 03/10/2015, 7:41 AM  LOS: 2 days     By signing my name below, I, Burnett Harry, attest that this documentation has been prepared under the direction and in the presence of [facility]. MD Electronically Signed: Burnett Harry, Scribe. 03/10/2015  11:59am  I, Dr. Durward Mallard  Memon, personally performed the services described in this documentaiton. All medical record entries made by the scribe were at my direction and in my presence. I have reviewed the chart and agree that the record reflects my personal performance and is accurate and complete  Erick BlinksJehanzeb Memon, MD, 03/10/2015 12:09 PM

## 2015-03-11 DIAGNOSIS — K279 Peptic ulcer, site unspecified, unspecified as acute or chronic, without hemorrhage or perforation: Secondary | ICD-10-CM

## 2015-03-11 MED ORDER — OXYCODONE-ACETAMINOPHEN 7.5-325 MG PO TABS
1.0000 | ORAL_TABLET | ORAL | Status: DC | PRN
  Administered 2015-03-11: 2 via ORAL
  Administered 2015-03-12: 1 via ORAL
  Filled 2015-03-11: qty 2
  Filled 2015-03-11: qty 1

## 2015-03-11 MED ORDER — SUCRALFATE 1 GM/10ML PO SUSP
1.0000 g | Freq: Three times a day (TID) | ORAL | Status: DC
  Administered 2015-03-11 – 2015-03-12 (×4): 1 g via ORAL
  Filled 2015-03-11 (×4): qty 10

## 2015-03-11 NOTE — Progress Notes (Signed)
[facility] PROGRESS NOTE  Sheila [name] [id number]:[id number] DOB: 10/05/1955 DOA: 03/08/2015 PCP: Geraldo PitterBLAND,VEITA J, MD  Assessment/Plan: 1. Epigastric abdominal pain.  HIDA scan did not show any evidence of biliary leak. Further imaging with CT scan showed significant improvement from prior. GI is following and performed EGD 11/18 which revealed multiple large gastric ulcers in the gastric antrum, at the angularis, and in the cardia. It also revealed that the bile duct stent was seen in the second portion of the duodenum. Will continue PPI BID and Carafate per GI. Biopsy pending.  Patient tolerated liquid diet well, will advance as tolerated.  2. Hypertension, stable. Continue outpatient regimen 3. Hypokalemia. Likely related to decreased by mouth intake. Repleted. Magnesium is normal. 4. Dehydration, improving. Will continue with IV fluids. 5. Coronary artery disease with elevated troponin. No complaint of chest pain at this time. EKG is reassuring. Recently followed up with cardiology and was cleared for further workup of abdominal pain. They will not pursue cardiac cath until abdominal workup has been completed since she will likely need antiplatelet agents which would prevent any GI interventions. Troponins were cycled and noted to be minimally elevated. With no CP or EKG changes, this is likely insignificant.    Code Status: full code DVT Prophylaxis: heparin Family Communication: Husband at bedside. Discussed care plan with him. Disposition Plan: Discharge tomorrow if continues to improve.   Consultants:  Surgery- Dr. Lovell SheehanJenkins  GI- Dr. Jena Gaussourk  Procedures:  EGD 11/18  Antibiotics:    HPI/Subjective: Feels okay. Still has some pain. Tolerated liquid diet well.   Objective: Filed Vitals:   03/11/15 0506  BP: 159/68  Pulse: 72  Temp: 97.5 F (36.4 C)  Resp: 18    Intake/Output Summary (Last 24 hours) at 03/11/15 0743 Last data filed at 03/11/15 [id number]  Gross per 24 hour   Intake    720 ml  Output   2000 ml  Net  -1280 ml   Filed Weights   03/08/15 1308 03/10/15 0916  Weight: 54.976 kg (121 lb 3.2 oz) 54.885 kg (121 lb)    Exam:  General: NAD, looks comfortable, somnolent Cardiovascular: RRR, S1, S2  Respiratory: clear bilaterally, No wheezing, rales or rhonchi Abdomen: soft, epigastric tenderness, no distention , bowel sounds normal Musculoskeletal: No edema b/l   Data Reviewed: Basic Metabolic Panel:  Recent Labs Lab 03/08/15 1331 03/09/15 0614 03/10/15 0650  NA 141 144 142  K 2.4* 4.3 4.5  CL 97* 111 111  CO2 29 28 25   GLUCOSE 125* 123* 114*  BUN 13 12 8   CREATININE 0.79 0.54 0.54  CALCIUM 9.9 9.2 9.3  MG 2.1  --   --    Liver Function Tests:  Recent Labs Lab 03/08/15 1331 03/09/15 0614  AST 39 31  ALT 32 27  ALKPHOS 116 95  BILITOT 0.7 0.5  PROT 8.4* 6.9  ALBUMIN 4.1 3.3*    Recent Labs Lab 03/08/15 1331  LIPASE 55*   CBC:  Recent Labs Lab 03/08/15 1331 03/09/15 0614 03/10/15 0650  WBC 7.8 9.9 12.2*  HGB 12.2 10.2* 9.5*  HCT 38.7 33.4* 31.3*  MCV 93.0 96.0 97.2  PLT 643* 539* 482*    Scheduled Meds: . amLODipine  10 mg Oral Daily  . cloNIDine  0.2 mg Oral BID  . escitalopram  10 mg Oral Daily  . feeding supplement (ENSURE ENLIVE)  237 mL Oral TID BM  . feeding supplement (ENSURE ENLIVE)  237 mL Oral TID WC  . feeding supplement (  ENSURE ENLIVE)  237 mL Oral TID BM  . gi cocktail  30 mL Oral TID  . isosorbide mononitrate  30 mg Oral Daily  . metoprolol  50 mg Oral TID  . ondansetron (ZOFRAN) IV  4 mg Intravenous TID WC & HS  . ondansetron (ZOFRAN) IV  4 mg Intravenous TID WC & HS  . pantoprazole  40 mg Oral BID AC  . sodium chloride  3 mL Intravenous Q12H   Continuous Infusions: . 0.9 % NaCl with KCl 40 mEq / L 50 mL/hr (03/11/15 0129)    Active Problems:   CAD (coronary artery disease)   HTN (hypertension)   Dehydration   Hypokalemia   Abdominal pain   Nausea without vomiting   Nausea  with vomiting    Time spent:    Eurydice Calixto. MD [facility] Pager [phone]. If 7PM-7AM, please contact night-coverage at [email or web address], password [facility] 03/11/2015, 7:43 AM  LOS: 3 days     By signing my name below, I, Burnett Harry, attest that this documentation has been prepared under the direction and in the presence of [facility]. MD Electronically Signed: Burnett Harry, Scribe. 03/11/2015  12:30pm  Camilo Mander

## 2015-03-11 NOTE — Progress Notes (Signed)
Discussed with Dr. Kerry HoughMemon.  Multiple gastric ulcers found yesterday. 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 Biopsies pending.   Vital signs in last 24 hours: Temp:  [97.5 F (36.4 C)-99.4 F (37.4 C)] 97.5 F (36.4 C) (11/19 0506) Pulse Rate:  [70-87] 70 (11/19 0918) Resp:  [18-20] 18 (11/19 0506) BP: (116-159)/(57-68) 130/67 mmHg (11/19 0918) SpO2:  [96 %-97 %] 96 % (11/19 0506) Last BM Date: 03/07/15 General:   Alert,  Well-developed, well-nourished, pleasant and cooperative in NAD. Accompanied by her former husband Abdomen:  Nondistended..  Normal bowel sounds Very minimal diffuse tenderness to palpation. No mass or organomegaly. Extremities:  Without clubbing or edema.    Intake/Output from previous day: 11/18 0701 - 11/19 0700 In: 720 [P.O.:720] Out: 2000 [Urine:2000] Intake/Output this shift: Total I/O In: 120 [P.O.:120] Out: -   Lab Results:  Recent Labs  03/08/15 1331 03/09/15 0614 03/10/15 0650  WBC 7.8 9.9 12.2*  HGB 12.2 10.2* 9.5*  HCT 38.7 33.4* 31.3*  PLT 643* 539* 482*   BMET  Recent Labs  03/08/15 1331 03/09/15 0614 03/10/15 0650  NA 141 144 142  K 2.4* 4.3 4.5  CL 97* 111 111  CO2 29 28 25   GLUCOSE 125* 123* 114*  BUN 13 12 8   CREATININE 0.79 0.54 0.54  CALCIUM 9.9 9.2 9.3   LFT  Recent Labs  03/09/15 0614  PROT 6.9  ALBUMIN 3.3*  AST 31  ALT 27  ALKPHOS 95  BILITOT 0.5   PT/INR No results for input(s): LABPROT, INR in the last 72 hours. Hepatitis Panel No results for input(s): HEPBSAG, HCVAB, HEPAIGM, HEPBIGM in the last 72 hours. C-Diff No results for input(s): CDIFFTOX in the last 72 hours.  Studies/Results: No results found.  Assessment: Active Problems:   CAD (coronary artery disease)   HTN (hypertension)   Dehydration   Hypokalemia   Abdominal pain   Nausea without vomiting   Nausea with vomiting   Impression:    Pleasant lady with recent cholecystectomy, kidney biopsy tract and  bile leak. Bile leak now documented sealed as evidenced by a negative HIDA in CT. EGD demonstrated multiple gastric ulcers-likely NSAID related but biopsies pending.  Recommendations:  Advance diet as tolerated. Home as per hospitalist.  Continue twice a day PPI for 3 months. Would add Carafate 1 g 4 times a day 2 weeks. Avoidance of NSAIDs very important. We'll have her return in 3 months for both an EGD to verify ulcer healing and ERCP with stent removal in the same session.

## 2015-03-12 MED ORDER — PANTOPRAZOLE SODIUM 40 MG PO TBEC: 40.0000 mg | DELAYED_RELEASE_TABLET | Freq: Two times a day (BID) | ORAL | Status: DC

## 2015-03-12 MED ORDER — OXYCODONE-ACETAMINOPHEN 7.5-325 MG PO TABS: 1.0000 | ORAL_TABLET | ORAL | Status: DC | PRN

## 2015-03-12 MED ORDER — SUCRALFATE 1 GM/10ML PO SUSP: 1.0000 g | Freq: Three times a day (TID) | ORAL | Status: DC

## 2015-03-12 NOTE — Discharge Summary (Addendum)
Physician Discharge Summary  Sheila [name] ZOX:[id number] DOB: December 25, 1955 DOA: 03/08/2015  PCP: [name], MD  Admit date: 03/08/2015 Discharge date: 03/12/2015  Time spent: 25 minutes  Recommendations for Outpatient Follow-up:  1. Follow up with PCP within 1-2 weeks.  2. Follow up with GI in three months for repeat EGD/ERCP.  Discharge Diagnoses:  Active Problems:   CAD (coronary artery disease)   HTN (hypertension)   Dehydration   Hypokalemia   Abdominal pain due to gastric ulcers   Nausea without vomiting   Nausea with vomiting   Peptic ulcer disease Severe malnutrition  Discharge Condition: Improved   Diet recommendation: Heart healthy   Filed Weights   03/08/15 1308 03/10/15 0916  Weight: 54.976 kg (121 lb 3.2 oz) 54.885 kg (121 lb)    History of present illness:  59 y.o. female with a history of abdominal pain who had a cholecystectomy done in 01/2015. She was readmitted at [facility] recently for abdominal pain and found to have a bile leak. She was seen by GI and there was possibility of repeat ERCP with replacing biliary stent. Unfortunately she had ischemic changes on EKG and was seen by cardiology. Stress test showed small reversible defects, but she was cleared by cardiology for further workup of her abdominal pain. The time for discharge she reports she was improving but reports that her symptoms returned within 2-3 days after discharge. She describes constant aching epigastric pain which radiates to her right upper quadrant. She has intermittent nausea but no vomiting. Her by mouth intake has been poor and her appetite has been poor. She's not had any fever. She came back to the GI office in follow-up and was referred for direct admission for further workup of her abdominal pain.  Hospital Course:  Biliary leak was ruled out for origin of abdominal pain by HIDA scan during hospitalization. CT A/P did not show any acute findings. GI followed closely and performed EGD,  which revealed multiple large gastric ulcers in the gastric antrum, at the angularis, and in the cardia as well as the bile duct stent seen in the second portion of the duodenum. Biopsy is pending but likely due to poly-NSAID use. Upon discharge instructed to continue PPI BID for three months and Carafate QID for two weeks, per GI. Upon admission she was placed on NPO, yet was able to slowly advance diet during hospitalization without difficulty. Was advised to avoid NSAID use.  1. Hypertension, stable. Continued outpatient regimen. 2. Hypokalemia. Likely related to decreased by mouth intake. Repleted. Magnesium is normal. 3. Dehydration, resolved with IV fluids. 4. Coronary artery disease with elevated troponin. No complaint of chest pain. EKG was reassuring. Recently followed up with cardiology and was cleared for further workup of abdominal pain. They decided to not pursue cardiac cath until abdominal workup has been completed since she will likely need antiplatelet agents which would prevent any GI interventions. Troponins were cycled and noted to be minimally elevated. With no CP or EKG changes, this is likely insignificant. 5. Severe malnutrition in the context of acute illness/injury. Followed by nutrition.   Procedures:  EGD 11/18  Consultations:  GI  General surgery   Discharge Exam: Filed Vitals:   03/12/15 0652  BP: 114/61  Pulse: 68  Temp: 98.8 F (37.1 C)  Resp: 20    General: NAD.Marland Kitchen Lying in bed and looks calm and comfortable. Cardiovascular: RRR, S1, S2  Respiratory: clear bilaterally, No wheezing, rales or rhonchi. Normal respiratory efforts.  Abdomen: soft, non  tender, no distention, positive bowel sounds. Musculoskeletal: No edema b/l  Discharge Instructions    Current Discharge Medication List    CONTINUE these medications which have NOT CHANGED   Details  amLODipine (NORVASC) 10 MG tablet Take 1 tablet (10 mg total) by mouth daily. Qty: 30 tablet,  Refills: 6    cloNIDine (CATAPRES) 0.2 MG tablet Take 1 tablet (0.2 mg total) by mouth 2 (two) times daily. Qty: 60 tablet, Refills: 6    escitalopram (LEXAPRO) 10 MG tablet Take 10 mg by mouth daily.     isosorbide mononitrate (IMDUR) 30 MG 24 hr tablet Take 1 tablet (30 mg total) by mouth daily. Qty: 30 tablet, Refills: 0    metoprolol (LOPRESSOR) 50 MG tablet Take 50 mg by mouth 3 (three) times daily.    Multiple Vitamins-Calcium (ONE-A-DAY WOMENS FORMULA PO) Take 1 tablet by mouth daily.    naproxen sodium (ANAPROX) 220 MG tablet Take 220 mg by mouth 2 (two) times daily with a meal.    omeprazole (PRILOSEC) 40 MG capsule Take 40 mg by mouth daily.      STOP taking these medications     feeding supplement, ENSURE ENLIVE, (ENSURE ENLIVE) LIQD        Allergies  Allergen Reactions  . Hydrocodone-Acetaminophen Nausea And Vomiting      The results of significant diagnostics from this hospitalization (including imaging, microbiology, ancillary and laboratory) are listed below for reference.    Significant Diagnostic Studies: Ct Abdomen Pelvis Wo Contrast  02/17/2015  CLINICAL DATA:  Recent cholecystectomy for gangrenous cholecystitis. Right upper quadrant abdominal pain. EXAM: CT ABDOMEN AND PELVIS WITHOUT CONTRAST TECHNIQUE: Multidetector CT imaging of the abdomen and pelvis was performed following the standard protocol without IV contrast. COMPARISON:  01/25/2015 FINDINGS: Lower chest: Patchy parenchymal densities in the left lower lobe are concerning for a small focus of aspiration or pneumonia. Evidence for coronary artery calcifications. Hepatobiliary: There is a nonmetallic biliary stent with the distal aspect in the duodenum. Cholecystectomy clips are noted. Vague low-density in the gallbladder fossa is suggestive for fluid. This fluid tracks anterior towards the abdominal wall. Tiny focus of gas near the gallbladder fossa. Three small calcific foci between the liver and  right kidney upper pole are concerning for retained gallstones. These small calcifications are grouped together and roughly measure up to 1 cm in size. Inflammatory changes along the right upper anterior abdominal wall related to recent surgery. Limited evaluation of the liver and gallbladder fossa without intravenous contrast. Pancreas: Normal appearance of the pancreas without duct dilatation or inflammation. Spleen: No acute abnormality to the spleen. Adrenals/Urinary Tract: Normal appearance of the adrenal glands. Normal appearance of both kidneys without stones or hydronephrosis. Normal appearance of the urinary bladder. Stomach/Bowel: Moderate stool burden throughout the colon without acute colonic inflammation. Normal appearance of small bowel without obstruction. There may be mild inflammation of the proximal duodenum adjacent to the liver. Vascular/Lymphatic: Atherosclerotic calcifications in the aorta and iliac arteries without aneurysm. No significant abdominal or pelvic lymphadenopathy. Reproductive: Uterus is surgically absent. Other: No significant free fluid. Musculoskeletal: No suspicious bone lesions. IMPRESSION: Post cholecystectomy. There is a small amount of fluid and gas in the gallbladder fossa with expected inflammatory changes from recent surgery. Nonmetallic biliary stent. Small calcifications between the liver and right kidney are suggestive for retained gallstones. Patchy densities in the left lower lobe are concerning for a small focus of pneumonia or aspiration pneumonitis. Electronically Signed   By: Meriel Pica.D.  On: 02/17/2015 17:26   Dg Chest 2 View  02/17/2015  CLINICAL DATA:  Fever, chest pain, shortness of breath. EXAM: CHEST  2 VIEW COMPARISON:  January 25, 2015. FINDINGS: The heart size and mediastinal contours are within normal limits. Both lungs are clear. No pneumothorax or pleural effusion is noted. The visualized skeletal structures are unremarkable. IMPRESSION: No  active cardiopulmonary disease. Electronically Signed   By: Lupita Raider, M.D.   On: 02/17/2015 15:50   Nm Hepatobiliary Liver Func  03/08/2015  CLINICAL DATA:  Bile leak post cholecystectomy, underwent CBD stenting, continued pain, question continued bile leak EXAM: NUCLEAR MEDICINE HEPATOBILIARY IMAGING TECHNIQUE: Sequential images of the abdomen were obtained out to 90 minutes following intravenous administration of radiopharmaceutical. RADIOPHARMACEUTICALS:  5 mCi Tc-58m  Choletec IV COMPARISON:  02/18/2015 FINDINGS: Normal tracer extraction from bloodstream indicating normal hepatocellular function. Prompt excretion of tracer into biliary tree. Small bowel visualized by 18 minutes. Gallbladder surgically absent. No abnormal localization of tracer identified to suggest bile leak. Specifically, abnormal infrahepatic collection of tracer seen on the previous exam is no longer identified. Small amount of duodenogastric reflux of tracer is noted. IMPRESSION: Patent biliary tree. No evidence of bile leak. Small amount of duodenogastric reflux of tracer is incidentally noted. Electronically Signed   By: Ulyses Southward M.D.   On: 03/08/2015 16:27   Nm Hepatobiliary Including Gb  02/18/2015  CLINICAL DATA:  Status post cholecystectomy, evaluate for bile leak EXAM: NUCLEAR MEDICINE HEPATOBILIARY IMAGING TECHNIQUE: Sequential images of the abdomen were obtained out to 60 minutes following intravenous administration of radiopharmaceutical. RADIOPHARMACEUTICALS:  5.3 mCi Tc-62m  Choletec IV COMPARISON:  CT abdomen pelvis dated 02/17/2015 FINDINGS: Normal hepatic excretion. Within 15 minutes, radiotracer begins to pool along the inferior liver margin. This is clearly extraluminal and reflects a bile leak. Study was aborted after 45 minutes. IMPRESSION: Study is positive for bile leak. These results will be called to the ordering clinician or representative by the Radiologist Assistant, and communication documented in  the PACS or zVision Dashboard. Electronically Signed   By: Charline Bills M.D.   On: 02/18/2015 14:06   Ct Abdomen Pelvis W Contrast  03/08/2015  CLINICAL DATA:  Upper abdominal pain with nausea since cholecystectomy approximately 5 weeks ago. Weight loss. EXAM: CT ABDOMEN AND PELVIS WITH CONTRAST TECHNIQUE: Multidetector CT imaging of the abdomen and pelvis was performed using the standard protocol following bolus administration of intravenous contrast. CONTRAST:  OMNIPAQUE IOHEXOL 300 MG/ML  SOLN COMPARISON:  Abdominal pelvic CT 02/17/2015.  MRCP 02/18/2015. FINDINGS: Lower chest: Clear lung bases. No significant pleural or pericardial effusion. There is a small hiatal hernia. Hepatobiliary: Plastic biliary stent remains well positioned. The biliary system is decompressed. Peripherally enhancing fluid collection within the cholecystectomy bed measures 1.6 cm on image 18, improved from the MRCP. The subhepatic sinus tract extending into the right anterior abdominal wall appears less evident. No new or enlarging intra abdominal fluid collections are demonstrated. There are probable small spilled gallstones in the subhepatic space as noted previously (axial images 21 and 22). The remainder of the liver appears unremarkable. Pancreas: Unremarkable. No pancreatic ductal dilatation or surrounding inflammatory changes. Spleen: Stable in appearance without suspicious findings. Adrenals/Urinary Tract: Both adrenal glands appear normal. The kidneys appear normal without evidence of urinary tract calculus, suspicious lesion or hydronephrosis. No bladder abnormalities are seen. Stomach/Bowel: No evidence of bowel wall thickening, distention or surrounding inflammatory change. Vascular/Lymphatic: There are no enlarged abdominal or pelvic lymph nodes. Moderate atherosclerosis  of the aorta, its branches and the iliac arteries again noted. No evidence of large vessel occlusion. Reproductive: Hysterectomy.  No adnexal  mass. Other: Postsurgical changes within the anterior abdominal wall without definite residual fistula. Musculoskeletal: No acute or significant osseous findings. IMPRESSION: 1. Improving fluid collection in the cholecystectomy bed status post recent cholecystectomy complicated by bile leak. No new or enlarging fluid collections identified. Probable small gallstones in the subhepatic space again noted. 2. The biliary system remains decompressed status post biliary stenting. 3. No new findings identified. Electronically Signed   By: Carey Bullocks M.D.   On: 03/08/2015 19:16   Mr 3d Recon At Scanner  02/18/2015  CLINICAL DATA:  Status post laparoscopic cholecystectomy on 01/27/2015, bile leak via surgical wound, pain/tenderness EXAM: MRI ABDOMEN WITHOUT AND WITH CONTRAST (INCLUDING MRCP) TECHNIQUE: Multiplanar multisequence MR imaging of the abdomen was performed both before and after the administration of intravenous contrast. Heavily T2-weighted images of the biliary and pancreatic ducts were obtained, and three-dimensional MRCP images were rendered by post processing. CONTRAST:  12mL MULTIHANCE GADOBENATE DIMEGLUMINE 529 MG/ML IV SOLN COMPARISON:  Nuclear medicine hepatobiliary scan dated 02/18/2015. CT abdomen pelvis dated 02/09/2015. ERCP dated 02/08/2015. FINDINGS: Lower chest: Mild patchy opacity at the left lung base (series 7/image 6). Cardiomegaly. Hepatobiliary: Liver is notable for mild hepatic steatosis. No suspicious/enhancing hepatic lesions. Mild increased perfusion along the gallbladder fossa. Status post cholecystectomy. No intrahepatic or extrahepatic ductal dilatation. Common duct measures 5 mm (series 6/image 24). No choledocholithiasis is seen. 1.6 x 2.6 cm complex fluid collection with layering fluid-fluid level in the gallbladder fossa (series 8/image 21), likely reflecting a biloma. This collection is adjacent to susceptibility artifact from a surgical clips (series 8/image 20), likely  corresponding to the known bile leak at the cystic duct remnant/stump. Direct drainage of the collection to the skin surface via a catheter tract along the right anterior abdomen and exiting the right lateral abdominal wall (series 8/images 24, 27, and 35). Pancreas: Within normal limits. Spleen: Within normal limits. Adrenals/Urinary Tract: Adrenal glands are within normal limits. Kidneys within normal limits.  No hydronephrosis. Stomach/Bowel: Stomach is within normal limits. Visualized bowel is unremarkable. Vascular/Lymphatic: No evidence of abdominal aortic aneurysm. No suspicious abdominal lymphadenopathy. Other: No abdominal ascites. Musculoskeletal: No focal osseous lesions. IMPRESSION: Status post cholecystectomy. No intrahepatic or extrahepatic ductal dilatation. Common duct measures 5 mm. No choledocholithiasis is seen. 1.6 x 2.6 cm complex fluid collection/biloma in the gallbladder fossa, likely corresponding to the known bile leak at the cystic duct remnant/stump. Drug drainage of the collection to the skin surface deviated a catheter tract along the right anterior abdomen and exiting the right lateral abdominal wall. Additional ancillary findings as above. Electronically Signed   By: Charline Bills M.D.   On: 02/18/2015 19:43   Nm Myocar Multi W/spect W/wall Motion / Ef  02/20/2015  CLINICAL DATA:  Chest pain.  Coronary artery disease. EXAM: MYOCARDIAL IMAGING WITH SPECT (REST AND PHARMACOLOGIC-STRESS) GATED LEFT VENTRICULAR WALL MOTION STUDY LEFT VENTRICULAR EJECTION FRACTION TECHNIQUE: Standard myocardial SPECT imaging was performed after resting intravenous injection of 10 mCi Tc-88m sestamibi. Subsequently, intravenous infusion of Lexiscan was performed under the supervision of the Cardiology staff. At peak effect of the drug, 30 mCi Tc-16m sestamibi was injected intravenously and standard myocardial SPECT imaging was performed. Quantitative gated imaging was also performed to evaluate left  ventricular wall motion, and estimate left ventricular ejection fraction. COMPARISON:  03/21/2014 FINDINGS: Perfusion: 2 reversible defects involving the lateral wall. The  smaller defect is located along the anterior aspect of the lateral wall near the apex and the other slightly larger defect is located in the midportion of the lateral wall posteriorly. Wall Motion: Lateral wall motion abnormalities. The remaining walls of the left ventricle demonstrate normal wall motion and thickening with contraction. Left Ventricular Ejection Fraction: 70 % End diastolic volume 84 ml End systolic volume 25 ml IMPRESSION: 1. Two reversible defects involving the lateral wall consistent with areas of ischemia. 2. Lateral wall motion abnormality. 3. Left ventricular ejection fraction 70% 4. High-risk stress test findings*. *2012 Appropriate Use Criteria for Coronary Revascularization Focused Update: J Am Coll Cardiol. 2012;59(9):857-881. http://content.dementiazones.com.aspx?articleid=[email or web address] These results will be called to the ordering clinician or representative by the Radiologist Assistant, and communication documented in the PACS or zVision Dashboard. Electronically Signed   By: Rudie Meyer M.D.   On: 02/20/2015 15:36   Mr Abd W/wo Cm/mrcp  02/18/2015  CLINICAL DATA:  Status post laparoscopic cholecystectomy on 01/27/2015, bile leak via surgical wound, pain/tenderness EXAM: MRI ABDOMEN WITHOUT AND WITH CONTRAST (INCLUDING MRCP) TECHNIQUE: Multiplanar multisequence MR imaging of the abdomen was performed both before and after the administration of intravenous contrast. Heavily T2-weighted images of the biliary and pancreatic ducts were obtained, and three-dimensional MRCP images were rendered by post processing. CONTRAST:  12mL MULTIHANCE GADOBENATE DIMEGLUMINE 529 MG/ML IV SOLN COMPARISON:  Nuclear medicine hepatobiliary scan dated 02/18/2015. CT abdomen pelvis dated 02/09/2015. ERCP dated 02/08/2015. FINDINGS: Lower  chest: Mild patchy opacity at the left lung base (series 7/image 6). Cardiomegaly. Hepatobiliary: Liver is notable for mild hepatic steatosis. No suspicious/enhancing hepatic lesions. Mild increased perfusion along the gallbladder fossa. Status post cholecystectomy. No intrahepatic or extrahepatic ductal dilatation. Common duct measures 5 mm (series 6/image 24). No choledocholithiasis is seen. 1.6 x 2.6 cm complex fluid collection with layering fluid-fluid level in the gallbladder fossa (series 8/image 21), likely reflecting a biloma. This collection is adjacent to susceptibility artifact from a surgical clips (series 8/image 20), likely corresponding to the known bile leak at the cystic duct remnant/stump. Direct drainage of the collection to the skin surface via a catheter tract along the right anterior abdomen and exiting the right lateral abdominal wall (series 8/images 24, 27, and 35). Pancreas: Within normal limits. Spleen: Within normal limits. Adrenals/Urinary Tract: Adrenal glands are within normal limits. Kidneys within normal limits.  No hydronephrosis. Stomach/Bowel: Stomach is within normal limits. Visualized bowel is unremarkable. Vascular/Lymphatic: No evidence of abdominal aortic aneurysm. No suspicious abdominal lymphadenopathy. Other: No abdominal ascites. Musculoskeletal: No focal osseous lesions. IMPRESSION: Status post cholecystectomy. No intrahepatic or extrahepatic ductal dilatation. Common duct measures 5 mm. No choledocholithiasis is seen. 1.6 x 2.6 cm complex fluid collection/biloma in the gallbladder fossa, likely corresponding to the known bile leak at the cystic duct remnant/stump. Drug drainage of the collection to the skin surface deviated a catheter tract along the right anterior abdomen and exiting the right lateral abdominal wall. Additional ancillary findings as above. Electronically Signed   By: Charline Bills M.D.   On: 02/18/2015 19:43    Microbiology: No results found  for this or any previous visit (from the past 240 hour(s)).   Labs: Basic Metabolic Panel:  Recent Labs Lab 03/08/15 1331 03/09/15 0614 03/10/15 0650  NA 141 144 142  K 2.4* 4.3 4.5  CL 97* 111 111  CO2 GLUCOSE 125* 123* 114*  BUN CREATININE 0.79 0.54 0.54  CALCIUM 9.9 9.2 9.3  MG  2.1  --   --    Liver Function Tests:  Recent Labs Lab 03/08/15 1331 03/09/15 0614  AST 39 31  ALT 32 27  ALKPHOS 116 95  BILITOT 0.7 0.5  PROT 8.4* 6.9  ALBUMIN 4.1 3.3*    Recent Labs Lab 03/08/15 1331  LIPASE 55*  CBC:  Recent Labs Lab 03/08/15 1331 03/09/15 0614 03/10/15 0650  WBC 7.8 9.9 12.2*  HGB 12.2 10.2* 9.5*  HCT 38.7 33.4* 31.3*  MCV 93.0 96.0 97.2  PLT 643* 539* 482*   Cardiac Enzymes:  Recent Labs Lab 03/09/15 1049 03/09/15 1620 03/09/15 2243  TROPONINI 0.05* 0.04* 0.04*    Signed:  Erick BlinksMemon, Daden Mahany, MD  [facility] 03/12/2015, 8:13 AM    By signing my name below, I, Zadie CleverlyJessica Augustus, attest that this documentation has been prepared under the direction and in the presence of Erick BlinksJehanzeb Sha Amer, MD. Electronically signed: Zadie CleverlyJessica Augustus, Scribe. 03/12/2015 10:45am.    I, Dr. Erick BlinksJehanzeb Brentley Landfair, personally performed the services described in this documentaiton. All medical record entries made by the scribe were at my direction and in my presence. I have reviewed the chart and agree that the record reflects my personal performance and is accurate and complete  Erick BlinksJehanzeb Dellar Traber, MD, 03/12/2015 11:06 AM

## 2015-03-12 NOTE — Progress Notes (Signed)
Pt discharged home today per Dr. Memon. Pt's IV site D/C'd and WDL. Pt's VSS. Pt provided with home medication list, discharge instructions and prescriptions. Verbalized understanding. Pt left floor via WC in stable condition accompanied by NT.  

## 2015-03-13 ENCOUNTER — Telehealth: Payer: Self-pay | Admitting: Obstetrics & Gynecology

## 2015-03-13 ENCOUNTER — Telehealth: Payer: Self-pay | Admitting: Internal Medicine

## 2015-03-13 MED ORDER — SUCRALFATE 1 G PO TABS
1.0000 g | ORAL_TABLET | Freq: Four times a day (QID) | ORAL | Status: AC
Start: 2015-03-13 — End: ?

## 2015-03-13 NOTE — Telephone Encounter (Signed)
Pt states she was DX in the hospital for colon ulcers and was following up with Dr. Despina HiddenEure for medication. Pt informed Dr.Eure is a OB/GYN specialist. Pt states that this was the number she was given at the hospital at discharge. After reviewing pt chart, informed pt she should f/u with Dr. Barkley Brunsourk/GI MD office, he was the provider that did the EGD. Pt verbalized understanding.

## 2015-03-13 NOTE — Telephone Encounter (Signed)
New Rx sent to pharmacy for Carafate tablets. She can crush them and mix with water into a slurry. Call if any problems.

## 2015-03-13 NOTE — Progress Notes (Signed)
CC'D TO PCP °

## 2015-03-13 NOTE — Telephone Encounter (Signed)
[phone]  PLEASE CALL PT REGARDING GETTING SOME SAMPLES OF HER MEDICATION.  SHE HAS NOT INSURANCE AND HER PRESCRIPTIONS CAME TO 500$

## 2015-03-13 NOTE — Addendum Note (Signed)
Addended by: Delane GingerGILL, ERIC A on: 03/13/2015 11:19 AM   Modules accepted: Orders

## 2015-03-13 NOTE — Telephone Encounter (Signed)
I spoke with the pt, she cannot afford the carafate. Gave pt #2 boxes of carafate suspension. Asked her if she would be interested in the pill form that she would have to crush and put in a liquid to make a slurry, as these are usually cheaper. She said yes and to please send in an rx for the pill form [facility]. She will try to get someone to pick up the samples today.

## 2015-03-15 ENCOUNTER — Encounter (HOSPITAL_COMMUNITY): Payer: Self-pay | Admitting: Gastroenterology

## 2015-03-27 DIAGNOSIS — Z955 Presence of coronary angioplasty implant and graft: Secondary | ICD-10-CM | POA: Diagnosis not present

## 2015-03-27 DIAGNOSIS — E7800 Pure hypercholesterolemia, unspecified: Secondary | ICD-10-CM | POA: Diagnosis not present

## 2015-03-27 DIAGNOSIS — I2510 Atherosclerotic heart disease of native coronary artery without angina pectoris: Secondary | ICD-10-CM | POA: Diagnosis not present

## 2015-03-31 ENCOUNTER — Telehealth: Payer: Self-pay | Admitting: Gastroenterology

## 2015-03-31 NOTE — Telephone Encounter (Signed)
Please call pt. Sheila [name] stomach Bx shows gastritis. CONTINUE OMEPRAZOLE.  TAKE 30 MINUTES PRIOR TO YOUR FIRST MEAL. See dr. Ewing Schleinmagod or rourk for a REPEAT EGD IN 2-3 MOS TO CONFIRM Sheila [name] ULCERS ARE HEALED.

## 2015-04-04 NOTE — Telephone Encounter (Signed)
Letter mailed to the pt. Please schedule ov. 

## 2015-04-05 NOTE — Telephone Encounter (Signed)
PATIENT COMING IN 12/20  WANTED AN APPOINTMENT SOONER BECAUSE OF ABD PAIN

## 2015-04-07 ENCOUNTER — Encounter: Payer: Self-pay | Admitting: Adult Health

## 2015-04-07 ENCOUNTER — Encounter: Payer: Medicare Other | Admitting: Adult Health

## 2015-04-07 DIAGNOSIS — R0989 Other specified symptoms and signs involving the circulatory and respiratory systems: Secondary | ICD-10-CM

## 2015-04-07 NOTE — Progress Notes (Signed)
Cardiology Office Note   ERROR NO SHOW 

## 2015-04-11 ENCOUNTER — Encounter: Payer: Self-pay | Admitting: Nurse Practitioner

## 2015-04-11 ENCOUNTER — Telehealth: Payer: Self-pay | Admitting: Nurse Practitioner

## 2015-04-11 ENCOUNTER — Ambulatory Visit: Payer: Medicare Other | Admitting: Nurse Practitioner

## 2015-04-11 NOTE — Telephone Encounter (Signed)
Noted  

## 2015-04-11 NOTE — Telephone Encounter (Signed)
PATIENT WAS A NO SHOW AND LETTER SENT  °

## 2015-04-22 ENCOUNTER — Inpatient Hospital Stay (HOSPITAL_COMMUNITY)
Admission: EM | Admit: 2015-04-22 | Discharge: 2015-04-28 | DRG: 862 | Disposition: A | Discharge status: Home Health | Payer: Medicare Other | Attending: Internal Medicine | Admitting: Internal Medicine

## 2015-04-22 ENCOUNTER — Encounter (HOSPITAL_COMMUNITY): Payer: Self-pay | Admitting: Emergency Medicine

## 2015-04-22 ENCOUNTER — Emergency Department (HOSPITAL_COMMUNITY): Payer: Medicare Other

## 2015-04-22 DIAGNOSIS — Z9049 Acquired absence of other specified parts of digestive tract: Secondary | ICD-10-CM | POA: Diagnosis not present

## 2015-04-22 DIAGNOSIS — M1990 Unspecified osteoarthritis, unspecified site: Secondary | ICD-10-CM | POA: Diagnosis present

## 2015-04-22 DIAGNOSIS — Z87891 Personal history of nicotine dependence: Secondary | ICD-10-CM

## 2015-04-22 DIAGNOSIS — Z955 Presence of coronary angioplasty implant and graft: Secondary | ICD-10-CM

## 2015-04-22 DIAGNOSIS — T814XXA Infection following a procedure, initial encounter: Secondary | ICD-10-CM | POA: Diagnosis not present

## 2015-04-22 DIAGNOSIS — R1011 Right upper quadrant pain: Secondary | ICD-10-CM

## 2015-04-22 DIAGNOSIS — B961 Klebsiella pneumoniae [K. pneumoniae] as the cause of diseases classified elsewhere: Secondary | ICD-10-CM | POA: Diagnosis present

## 2015-04-22 DIAGNOSIS — N179 Acute kidney failure, unspecified: Secondary | ICD-10-CM | POA: Diagnosis present

## 2015-04-22 DIAGNOSIS — K279 Peptic ulcer, site unspecified, unspecified as acute or chronic, without hemorrhage or perforation: Secondary | ICD-10-CM | POA: Diagnosis not present

## 2015-04-22 DIAGNOSIS — E876 Hypokalemia: Secondary | ICD-10-CM | POA: Diagnosis not present

## 2015-04-22 DIAGNOSIS — M329 Systemic lupus erythematosus, unspecified: Secondary | ICD-10-CM | POA: Diagnosis present

## 2015-04-22 DIAGNOSIS — D649 Anemia, unspecified: Secondary | ICD-10-CM | POA: Diagnosis present

## 2015-04-22 DIAGNOSIS — Z90710 Acquired absence of both cervix and uterus: Secondary | ICD-10-CM | POA: Diagnosis not present

## 2015-04-22 DIAGNOSIS — K750 Abscess of liver: Secondary | ICD-10-CM | POA: Diagnosis not present

## 2015-04-22 DIAGNOSIS — Z8711 Personal history of peptic ulcer disease: Secondary | ICD-10-CM | POA: Diagnosis not present

## 2015-04-22 DIAGNOSIS — R1010 Upper abdominal pain, unspecified: Secondary | ICD-10-CM | POA: Diagnosis not present

## 2015-04-22 DIAGNOSIS — Z8249 Family history of ischemic heart disease and other diseases of the circulatory system: Secondary | ICD-10-CM

## 2015-04-22 DIAGNOSIS — E7800 Pure hypercholesterolemia, unspecified: Secondary | ICD-10-CM | POA: Diagnosis present

## 2015-04-22 DIAGNOSIS — I10 Essential (primary) hypertension: Secondary | ICD-10-CM | POA: Diagnosis not present

## 2015-04-22 DIAGNOSIS — I252 Old myocardial infarction: Secondary | ICD-10-CM | POA: Diagnosis not present

## 2015-04-22 DIAGNOSIS — I2510 Atherosclerotic heart disease of native coronary artery without angina pectoris: Secondary | ICD-10-CM | POA: Diagnosis present

## 2015-04-22 DIAGNOSIS — R509 Fever, unspecified: Secondary | ICD-10-CM | POA: Diagnosis not present

## 2015-04-22 DIAGNOSIS — R109 Unspecified abdominal pain: Secondary | ICD-10-CM | POA: Diagnosis present

## 2015-04-22 DIAGNOSIS — N170 Acute kidney failure with tubular necrosis: Secondary | ICD-10-CM | POA: Diagnosis present

## 2015-04-22 DIAGNOSIS — Y836 Removal of other organ (partial) (total) as the cause of abnormal reaction of the patient, or of later complication, without mention of misadventure at the time of the procedure: Secondary | ICD-10-CM | POA: Diagnosis present

## 2015-04-22 LAB — CBC WITH DIFFERENTIAL/PLATELET
Basophils Absolute: 0 10*3/uL (ref 0.0–0.1)
Basophils Relative: 0 %
EOS ABS: 0 10*3/uL (ref 0.0–0.7)
EOS PCT: 0 %
HCT: 33.9 % — ABNORMAL LOW (ref 36.0–46.0)
Hemoglobin: 11 g/dL — ABNORMAL LOW (ref 12.0–15.0)
Lymphocytes Relative: 8 %
Lymphs Abs: 1.3 10*3/uL (ref 0.7–4.0)
MCH: 28.9 pg (ref 26.0–34.0)
MCHC: 32.4 g/dL (ref 30.0–36.0)
MCV: 89.2 fL (ref 78.0–100.0)
MONO ABS: 0.9 10*3/uL (ref 0.1–1.0)
Monocytes Relative: 6 %
NEUTROS PCT: 86 %
Neutro Abs: 13.1 10*3/uL — ABNORMAL HIGH (ref 1.7–7.7)
PLATELETS: 517 10*3/uL — AB (ref 150–400)
RBC: 3.8 MIL/uL — ABNORMAL LOW (ref 3.87–5.11)
RDW: 15.3 % (ref 11.5–15.5)
WBC: 15.3 10*3/uL — AB (ref 4.0–10.5)

## 2015-04-22 LAB — COMPREHENSIVE METABOLIC PANEL
ALK PHOS: 137 U/L — AB (ref 38–126)
ALT: 48 U/L (ref 14–54)
ANION GAP: 13 (ref 5–15)
AST: 55 U/L — ABNORMAL HIGH (ref 15–41)
Albumin: 3.4 g/dL — ABNORMAL LOW (ref 3.5–5.0)
BILIRUBIN TOTAL: 0.8 mg/dL (ref 0.3–1.2)
BUN: 8 mg/dL (ref 6–20)
CALCIUM: 9.3 mg/dL (ref 8.9–10.3)
CO2: 26 mmol/L (ref 22–32)
Chloride: 98 mmol/L — ABNORMAL LOW (ref 101–111)
Creatinine, Ser: 0.58 mg/dL (ref 0.44–1.00)
GFR calc non Af Amer: >60 mL/min (ref >60)
Glucose, Bld: 118 mg/dL — ABNORMAL HIGH (ref 65–99)
Potassium: 2.8 mmol/L — ABNORMAL LOW (ref 3.5–5.1)
SODIUM: 137 mmol/L (ref 135–145)
TOTAL PROTEIN: 8.3 g/dL — AB (ref 6.5–8.1)

## 2015-04-22 LAB — LIPASE, BLOOD: Lipase: 16 U/L (ref 11–51)

## 2015-04-22 MED ORDER — SODIUM CHLORIDE 0.9 % IV BOLUS (SEPSIS)
500.0000 mL | Freq: Once | INTRAVENOUS | Status: AC
  Administered 2015-04-22: 500 mL via INTRAVENOUS

## 2015-04-22 MED ORDER — IOHEXOL 300 MG/ML  SOLN
100.0000 mL | Freq: Once | INTRAMUSCULAR | Status: AC | PRN
Start: 2015-04-22 — End: 2015-04-22
  Administered 2015-04-22: 100 mL via INTRAVENOUS

## 2015-04-22 MED ORDER — ACETAMINOPHEN 325 MG PO TABS
650.0000 mg | ORAL_TABLET | Freq: Three times a day (TID) | ORAL | Status: DC | PRN
  Filled 2015-04-22: qty 2

## 2015-04-22 MED ORDER — SODIUM CHLORIDE 0.9 % IV SOLN: 250.0000 mL | INTRAVENOUS | Status: DC | PRN

## 2015-04-22 MED ORDER — FENTANYL CITRATE (PF) 100 MCG/2ML IJ SOLN
50.0000 ug | Freq: Once | INTRAMUSCULAR | Status: AC
  Administered 2015-04-22: 50 ug via INTRAVENOUS
  Filled 2015-04-22: qty 2

## 2015-04-22 MED ORDER — PIPERACILLIN-TAZOBACTAM 3.375 G IVPB
3.3750 g | Freq: Three times a day (TID) | INTRAVENOUS | Status: DC
  Administered 2015-04-22 – 2015-04-25 (×9): 3.375 g via INTRAVENOUS
  Filled 2015-04-22 (×9): qty 50

## 2015-04-22 MED ORDER — MORPHINE SULFATE (PF) 2 MG/ML IV SOLN
2.0000 mg | INTRAVENOUS | Status: DC | PRN
  Administered 2015-04-22 – 2015-04-28 (×14): 2 mg via INTRAVENOUS
  Filled 2015-04-22 (×13): qty 1

## 2015-04-22 MED ORDER — PIPERACILLIN-TAZOBACTAM 3.375 G IVPB 30 MIN
3.3750 g | Freq: Once | INTRAVENOUS | Status: AC
  Administered 2015-04-22: 3.375 g via INTRAVENOUS
  Filled 2015-04-22: qty 50

## 2015-04-22 MED ORDER — ESCITALOPRAM OXALATE 10 MG PO TABS
10.0000 mg | ORAL_TABLET | Freq: Every day | ORAL | Status: DC
  Administered 2015-04-22 – 2015-04-28 (×7): 10 mg via ORAL
  Filled 2015-04-22 (×7): qty 1

## 2015-04-22 MED ORDER — CLONIDINE HCL 0.2 MG PO TABS
0.2000 mg | ORAL_TABLET | Freq: Two times a day (BID) | ORAL | Status: DC
  Administered 2015-04-22 – 2015-04-27 (×10): 0.2 mg via ORAL
  Filled 2015-04-22 (×11): qty 1

## 2015-04-22 MED ORDER — ONDANSETRON HCL 4 MG PO TABS: 4.0000 mg | ORAL_TABLET | Freq: Four times a day (QID) | ORAL | Status: DC | PRN

## 2015-04-22 MED ORDER — DEXTROSE-NACL 5-0.45 % IV SOLN
INTRAVENOUS | Status: AC
  Administered 2015-04-22: 16:00:00 via INTRAVENOUS

## 2015-04-22 MED ORDER — ONDANSETRON HCL 4 MG/2ML IJ SOLN
4.0000 mg | Freq: Four times a day (QID) | INTRAMUSCULAR | Status: DC | PRN
  Administered 2015-04-22 – 2015-04-24 (×4): 4 mg via INTRAVENOUS
  Filled 2015-04-22 (×4): qty 2

## 2015-04-22 MED ORDER — PANTOPRAZOLE SODIUM 40 MG PO TBEC
40.0000 mg | DELAYED_RELEASE_TABLET | Freq: Every day | ORAL | Status: DC
  Administered 2015-04-23 – 2015-04-28 (×6): 40 mg via ORAL
  Filled 2015-04-22 (×7): qty 1

## 2015-04-22 MED ORDER — ONDANSETRON HCL 4 MG/2ML IJ SOLN
4.0000 mg | Freq: Once | INTRAMUSCULAR | Status: AC
  Administered 2015-04-22: 4 mg via INTRAVENOUS
  Filled 2015-04-22: qty 2

## 2015-04-22 MED ORDER — ENSURE ENLIVE PO LIQD
237.0000 mL | Freq: Two times a day (BID) | ORAL | Status: DC
  Administered 2015-04-22 – 2015-04-26 (×5): 237 mL via ORAL

## 2015-04-22 MED ORDER — POLYETHYLENE GLYCOL 3350 17 G PO PACK: 17.0000 g | PACK | Freq: Every day | ORAL | Status: DC | PRN

## 2015-04-22 MED ORDER — METOPROLOL TARTRATE 50 MG PO TABS
50.0000 mg | ORAL_TABLET | Freq: Three times a day (TID) | ORAL | Status: DC
  Administered 2015-04-22 – 2015-04-28 (×16): 50 mg via ORAL
  Filled 2015-04-22 (×19): qty 1

## 2015-04-22 MED ORDER — POTASSIUM CHLORIDE CRYS ER 20 MEQ PO TBCR
40.0000 meq | EXTENDED_RELEASE_TABLET | Freq: Once | ORAL | Status: DC
  Filled 2015-04-22: qty 2

## 2015-04-22 MED ORDER — SODIUM CHLORIDE 0.9 % IJ SOLN: 3.0000 mL | INTRAMUSCULAR | Status: DC | PRN

## 2015-04-22 MED ORDER — SUCRALFATE 1 G PO TABS
1.0000 g | ORAL_TABLET | Freq: Four times a day (QID) | ORAL | Status: DC
  Administered 2015-04-22 – 2015-04-28 (×22): 1 g via ORAL
  Filled 2015-04-22 (×22): qty 1

## 2015-04-22 MED ORDER — ACETAMINOPHEN 325 MG PO TABS
650.0000 mg | ORAL_TABLET | Freq: Four times a day (QID) | ORAL | Status: DC | PRN
  Administered 2015-04-22 – 2015-04-24 (×3): 650 mg via ORAL
  Filled 2015-04-22 (×2): qty 2

## 2015-04-22 MED ORDER — SODIUM CHLORIDE 0.9 % IJ SOLN
3.0000 mL | Freq: Two times a day (BID) | INTRAMUSCULAR | Status: DC
  Administered 2015-04-23 – 2015-04-24 (×2): 3 mL via INTRAVENOUS

## 2015-04-22 MED ORDER — OXYCODONE HCL 5 MG PO TABS
5.0000 mg | ORAL_TABLET | ORAL | Status: DC | PRN
  Administered 2015-04-24 – 2015-04-27 (×4): 5 mg via ORAL
  Filled 2015-04-22 (×6): qty 1

## 2015-04-22 NOTE — ED Provider Notes (Signed)
CSN: [id number]     Arrival date & time 04/22/15  0845 History  By signing my name below, I, Placido Sou, attest that this documentation has been prepared under the direction and in the presence of Benjiman Core, MD. Electronically Signed: Placido Sou, ED Scribe. 04/22/2015. 9:19 AM.   Chief Complaint  Patient presents with  . Post-op Problem   The history is provided by the patient. No language interpreter was used.    HPI Comments: Sheila [name] is a 59 y.o. female with a hx of PUD, MI, CVD and systemic lupus who presents to the Emergency Department with multiple complaints onset 1 week ago. Pt notes having an ERCP performed with associated, worsening, moderate, nausea, vomiting initially but denies any recent occurences, constipation, SOB, dark urine, chills, epigastric pain and fevers (98.2 F in triage) beginning s/p the the procedure. Pt also notes a SHx including a cholecystectomy in 01/2015. She denies any sick contacts. Pt denies any diarrhea, dysuria or other associated symptoms at this time.    Past Medical History  Diagnosis Date  . Myocardial infarction (HCC) 2012   . CVD (cardiovascular disease)     Stent to mid RCA 2012 [name] 2.5 X 12 mm  . Systemic lupus (HCC)   . Anxiety   . Hypertension   . Arthritis   . Hypercholesterolemia   . PUD (peptic ulcer disease)     Dr. in [place] in early 2016   Past Surgical History  Procedure Laterality Date  . Coronary angioplasty with stent placement  2012, 2013.    [name] DES to mid RCA. Stents X 2 to unknown artery  . Abdominal hysterectomy    . Tubal ligation    . Breast lumpectomy    . Cholecystectomy N/A 01/27/2015    Procedure: CHOLECYSTECTOMY;  Surgeon: Franky Macho, MD;  Location: [facility];  Service: General;  Laterality: N/A;  converted to open at 0941  . Esophagogastroduodenoscopy      [place]:PUD per patient  . Colonoscopy  2008    [facility], normal.   . Ercp N/A 02/08/2015    [id number] biliary leak/s/p stent  placement  . Biliary stent placement N/A 02/08/2015    Procedure: BILIARY STENT PLACEMENT;  Surgeon: Corbin Ade, MD;  Location: [facility];  Service: Endoscopy;  Laterality: N/A;  . Sphincterotomy N/A 02/08/2015    Procedure: SPHINCTEROTOMY;  Surgeon: Corbin Ade, MD;  Location: [facility];  Service: Endoscopy;  Laterality: N/A;  . Esophagogastroduodenoscopy N/A 03/10/2015    HQI:[id number] large gastric ulcers in gastric antrum at the angularis and in the cardia/bile duct stent seen in the scond portion   Family History  Problem Relation Age of Onset  . CAD Father     CABG  . Heart attack Brother     PTCA  . Hypertension Sister   . Hypertension Father   . Colon cancer Neg Hx   . Liver disease Neg Hx    Social History  Substance Use Topics  . Smoking status: Former Smoker    Types: Cigarettes  . Smokeless tobacco: Never Used  . Alcohol Use: No   OB History    No data available     Review of Systems  Constitutional: Positive for fever and chills.  Respiratory: Positive for shortness of breath.   Gastrointestinal: Positive for nausea, vomiting, abdominal pain and constipation. Negative for diarrhea.  Genitourinary: Negative for dysuria.  All other systems reviewed and are negative.   Allergies  Hydrocodone-acetaminophen  Home Medications  Prior to Admission medications   Medication Sig Start Date End Date Taking? Authorizing Provider  acetaminophen (TYLENOL 8 HOUR) 650 MG CR tablet Take 650 mg by mouth every 8 (eight) hours as needed for pain.   Yes Historical Provider, MD  cloNIDine (CATAPRES) 0.2 MG tablet Take 1 tablet (0.2 mg total) by mouth 2 (two) times daily. 03/07/15  Yes Jodelle Gross, NP  escitalopram (LEXAPRO) 10 MG tablet Take 10 mg by mouth daily.    Yes Historical Provider, MD  metoprolol (LOPRESSOR) 50 MG tablet Take 50 mg by mouth 3 (three) times daily.   Yes Historical Provider, MD  omeprazole (PRILOSEC) 40 MG capsule Take 40 mg by mouth daily.   Yes  Historical Provider, MD  OVER THE COUNTER MEDICATION Take 1 tablet by mouth daily. eZ Melts: Zinc Carnosine 37.5 mg   Yes Historical Provider, MD  OVER THE COUNTER MEDICATION Take 2 tablets by mouth 2 (two) times daily. NOW FOODS:  Slippery Elm 400 mg   Yes Historical Provider, MD  sucralfate (CARAFATE) 1 G tablet Take 1 tablet (1 g total) by mouth 4 (four) times daily. 03/13/15  Yes Anice Paganini, NP  amLODipine (NORVASC) 10 MG tablet Take 1 tablet (10 mg total) by mouth daily. Patient not taking: Reported on 04/22/2015 03/07/15   Jodelle Gross, NP  isosorbide mononitrate (IMDUR) 30 MG 24 hr tablet Take 1 tablet (30 mg total) by mouth daily. Patient not taking: Reported on 04/22/2015 02/23/15   Nishant Dhungel, MD  oxyCODONE-acetaminophen (PERCOCET) 7.5-325 MG tablet Take 1-2 tablets by mouth every 4 (four) hours as needed for severe pain. Patient not taking: Reported on 04/22/2015 03/12/15   Erick Blinks, MD  pantoprazole (PROTONIX) 40 MG tablet Take 1 tablet (40 mg total) by mouth 2 (two) times daily before a meal. Patient not taking: Reported on 04/22/2015 03/12/15   Erick Blinks, MD   BP 185/102 mmHg  Pulse 61  Temp(Src) 98.2 F (36.8 C) (Oral)  Resp 18  Ht 5' (1.524 m)  Wt 125 lb (56.7 kg)  BMI 24.41 kg/m2  SpO2 96%    Physical Exam  Constitutional: She is oriented to person, place, and time. She appears well-developed and well-nourished.  HENT:  Head: Normocephalic and atraumatic.  Mouth/Throat: No oropharyngeal exudate.  Neck: Normal range of motion. No tracheal deviation present.  Cardiovascular: Normal rate.   Pulmonary/Chest: Effort normal and breath sounds normal. No respiratory distress. She has no wheezes. She has no rales.  Abdominal: Soft. She exhibits no mass. There is tenderness. There is guarding.  Moderate RUQ tenderness with some guarding; no hernias  Musculoskeletal: Normal range of motion.  Neurological: She is alert and oriented to person, place, and  time.  Skin: Skin is warm and dry. She is not diaphoretic.  Psychiatric: She has a normal mood and affect. Her behavior is normal.  Nursing note and vitals reviewed.   ED Course  Procedures  DIAGNOSTIC STUDIES: Oxygen Saturation is 96% on RA, normal by my interpretation.    COORDINATION OF CARE: 9:15 AM Pt presents today with multiple complaints. Discussed next steps with pt and she agreed to the plan.   Labs Review Labs Reviewed  COMPREHENSIVE METABOLIC PANEL - Abnormal; Notable for the following:    Potassium 2.8 (*)    Chloride 98 (*)    Glucose, Bld 118 (*)    Total Protein 8.3 (*)    Albumin 3.4 (*)    AST 55 (*)    Alkaline  Phosphatase 137 (*)    All other components within normal limits  CBC WITH DIFFERENTIAL/PLATELET - Abnormal; Notable for the following:    WBC 15.3 (*)    RBC 3.80 (*)    Hemoglobin 11.0 (*)    HCT 33.9 (*)    Platelets 517 (*)    Neutro Abs 13.1 (*)    All other components within normal limits  CULTURE, BLOOD (ROUTINE X 2)  CULTURE, BLOOD (ROUTINE X 2)  LIPASE, BLOOD    Imaging Review Ct Abdomen Pelvis W Contrast  04/22/2015  CLINICAL DATA:  Initial encounter for 1 week history of nausea and vomiting with fever and chills. Right upper quadrant pain and constipation. EXAM: CT ABDOMEN AND PELVIS WITH CONTRAST TECHNIQUE: Multidetector CT imaging of the abdomen and pelvis was performed using the standard protocol following bolus administration of intravenous contrast. CONTRAST:  OMNIPAQUE IOHEXOL 300 MG/ML  SOLN COMPARISON:  03/08/2015. FINDINGS: Lower chest: The lungs are clear wiithout focal pneumonia, edema, unremarkable Hepatobiliary: There is a new 3.2 x 4.7 x 3.2 cm cystic lesion in the posterior hepatic dome with rim enhancement and a collar of altered perfusion/parenchymal edema. A second smaller 13 mm satellite lesion with similar features is seen just medial to the dominant abnormality. Ill-defined low-attenuation in the subcapsular  aspect of the anterior medial segment left liver was present previously and may represent focal fatty deposition. Gallbladder is surgically absent. No intra or extrahepatic biliary duct dilatation. Common bile duct drain is visualized in situ. The small 16 mm rim enhancing fluid collection seen in the gallbladder fossa on the previous study has resolved. A cluster of small calcifications seen in the subhepatic space just anterior to the right kidney persist and may represent spill gallstones. Pancreas: No focal mass lesion. No dilatation of the main duct. No intraparenchymal cyst. No peripancreatic edema. Spleen: No splenomegaly. No focal mass lesion. Adrenals/Urinary Tract: No adrenal nodule or mass. Kidneys are unremarkable. No evidence for hydroureter. The urinary bladder appears normal for the degree of distention. Stomach/Bowel: Stomach is nondistended. No gastric wall thickening. No evidence of outlet obstruction. Contrast material in the distal esophagus suggests reflux. Duodenum is normally positioned as is the ligament of Treitz. No small bowel wall thickening. No small bowel dilatation. The terminal ileum is normal. The appendix is not visualized, but there is no edema or inflammation in the region of the cecum. No gross colonic mass. No colonic wall thickening. No substantial diverticular change. Vascular/Lymphatic: There is abdominal aortic atherosclerosis without aneurysm. There is no gastrohepatic or hepatoduodenal ligament lymphadenopathy. No intraperitoneal or retroperitoneal lymphadenopy. No pelvic sidewall lymphadenopathy. Reproductive: Uterus is surgically absent. There is no adnexal mass. Other: No intraperitoneal free fluid. Musculoskeletal: Bone windows reveal no worrisome lytic or sclerotic osseous lesions. IMPRESSION: 1. Interval development of a new 4.7 cm rim enhancing fluid collection in the dome of the liver. Imaging features highly suspicious for intrahepatic abscess. A second adjacent  smaller fluid collection is compatible with a satellite lesion. 2. 16 mm rim enhancing fluid collection seen in the cholecystectomy bed on the previous study has resolved in the interval. 3. Probable small gallstones again noted in the subhepatic space. 4. No evidence for biliary dilatation in this patient with common bile duct stent in situ. I discussed these findings by telephone with Dr. Rubin Payor at approximate lead 1238 hours on 04/22/2015. Electronically Signed   By: Kennith Center M.D.   On: 04/22/2015 12:39   I have personally reviewed and evaluated these lab  results as part of my medical decision-making.   EKG Interpretation None      MDM   Final diagnoses:  Hepatic abscess    Patient with right upper quadrant pain. Previous complicated cholecystectomy. Found to have likely hepatic abscess. Will [facility] to [facility].  I personally performed the services described in this documentation, which was scribed in my presence. The recorded information has been reviewed and is accurate.      Benjiman CoreNathan Javeion Cannedy, MD 04/22/15 [phone]

## 2015-04-22 NOTE — H&P (Signed)
History and Physical  Sheila [name] WJX:[id number] DOB: [birth year] DOA: 04/22/2015  Referring physician: Benjiman Core, MD  PCP: [name], MD   Chief Complaint: Post-op Problems  HPI:  87 yof with PMH of PUD, cholecystectomy 01/2015 with postop bile leak, presented with subjective fever, chills, abd pain. CT ab/pelvis revealed liver abscess.  12/24 she reports that she ate a small meal, after which the vomiting, nausea, chills onset. She has had daily fevers, chills since, nausea, some vomiting (none today). No recent international travel.  EDP d/w Dr. Bonnielee Haff, likely can have drain placed 1/1.   In the emergency department VSS, afebrile and not hypoxic on room air.  Pertinent labs: BMP unremarkable except K+ 2.8, AST 55, LFTs otherwise unremarkable, WBC 15.3 Imaging: CT noted.   Review of Systems:  Positive for nausea, vomiting, SOB, chills, epigastric pain, and subjective fevers.  Negative for  visual changes, sore throat, rash, new muscle aches, chest pain, dysuria, bleeding, abdominal pain.  Past Medical History  Diagnosis Date  . Myocardial infarction (HCC) 2012   . CVD (cardiovascular disease)     Stent to mid RCA 2012 [name] 2.5 X 12 mm  . Systemic lupus (HCC)   . Anxiety   . Hypertension   . Arthritis   . Hypercholesterolemia   . PUD (peptic ulcer disease)     Dr. in [place] in early 2016    Past Surgical History  Procedure Laterality Date  . Coronary angioplasty with stent placement  2012, 2013.    [name] DES to mid RCA. Stents X 2 to unknown artery  . Abdominal hysterectomy    . Tubal ligation    . Breast lumpectomy    . Cholecystectomy N/A 01/27/2015    Procedure: CHOLECYSTECTOMY;  Surgeon: Franky Macho, MD;  Location: [facility];  Service: General;  Laterality: N/A;  converted to open at 0941  . Esophagogastroduodenoscopy      [place]:PUD per patient  . Colonoscopy  2008    [facility], normal.   . Ercp N/A 02/08/2015    [id number] biliary leak/s/p stent  placement  . Biliary stent placement N/A 02/08/2015    Procedure: BILIARY STENT PLACEMENT;  Surgeon: Corbin Ade, MD;  Location: [facility];  Service: Endoscopy;  Laterality: N/A;  . Sphincterotomy N/A 02/08/2015    Procedure: SPHINCTEROTOMY;  Surgeon: Corbin Ade, MD;  Location: [facility];  Service: Endoscopy;  Laterality: N/A;  . Esophagogastroduodenoscopy N/A 03/10/2015    ION:[id number] large gastric ulcers in gastric antrum at the angularis and in the cardia/bile duct stent seen in the scond portion    Social History:  reports that she has quit smoking. Her smoking use included Cigarettes. She has never used smokeless tobacco. She reports that she does not drink alcohol or use illicit drugs.   Allergies  Allergen Reactions  . Hydrocodone-Acetaminophen Nausea And Vomiting    Family History  Problem Relation Age of Onset  . CAD Father     CABG  . Heart attack Brother     PTCA  . Hypertension Sister   . Hypertension Father   . Colon cancer Neg Hx   . Liver disease Neg Hx      Prior to Admission medications   Medication Sig Start Date End Date Taking? Authorizing Provider  acetaminophen (TYLENOL 8 HOUR) 650 MG CR tablet Take 650 mg by mouth every 8 (eight) hours as needed for pain.   Yes Historical Provider, MD  cloNIDine (CATAPRES) 0.2 MG tablet Take 1 tablet (0.2  mg total) by mouth 2 (two) times daily. 03/07/15  Yes Jodelle Gross, NP  escitalopram (LEXAPRO) 10 MG tablet Take 10 mg by mouth daily.    Yes Historical Provider, MD  metoprolol (LOPRESSOR) 50 MG tablet Take 50 mg by mouth 3 (three) times daily.   Yes Historical Provider, MD  omeprazole (PRILOSEC) 40 MG capsule Take 40 mg by mouth daily.   Yes Historical Provider, MD  OVER THE COUNTER MEDICATION Take 1 tablet by mouth daily. eZ Melts: Zinc Carnosine 37.5 mg   Yes Historical Provider, MD  OVER THE COUNTER MEDICATION Take 2 tablets by mouth 2 (two) times daily. NOW FOODS:  Slippery Elm 400 mg   Yes Historical  Provider, MD  sucralfate (CARAFATE) 1 G tablet Take 1 tablet (1 g total) by mouth 4 (four) times daily. 03/13/15  Yes Anice Paganini, NP  amLODipine (NORVASC) 10 MG tablet Take 1 tablet (10 mg total) by mouth daily. Patient not taking: Reported on 04/22/2015 03/07/15   Jodelle Gross, NP  isosorbide mononitrate (IMDUR) 30 MG 24 hr tablet Take 1 tablet (30 mg total) by mouth daily. Patient not taking: Reported on 04/22/2015 02/23/15   Nishant Dhungel, MD  oxyCODONE-acetaminophen (PERCOCET) 7.5-325 MG tablet Take 1-2 tablets by mouth every 4 (four) hours as needed for severe pain. Patient not taking: Reported on 04/22/2015 03/12/15   Erick Blinks, MD  pantoprazole (PROTONIX) 40 MG tablet Take 1 tablet (40 mg total) by mouth 2 (two) times daily before a meal. Patient not taking: Reported on 04/22/2015 03/12/15   Erick Blinks, MD   Physical Exam: Filed Vitals:   04/22/15 0855 04/22/15 1005 04/22/15 1122  BP: 185/102 160/75 163/75  Pulse: 61 91 89  Temp: 98.2 F (36.8 C)    TempSrc: Oral    Resp: Height: 5' (1.524 m)    Weight: 56.7 kg (125 lb)    SpO2: 96% 96% 99%   General:  Appears calm and comfortable Eyes: PERRL, normal lids, irises & conjunctiva ENT: grossly normal hearing, lips & tongue Neck: no LAD, masses or thyromegaly Cardiovascular: RRR, no m/r/g. No LE edema. Respiratory: CTA bilaterally, no w/r/r. Normal respiratory effort. Abdomen:Appears unremarkable, soft, ntnd Skin: no rash or induration noted Musculoskeletal: grossly normal tone BUE/BLE Psychiatric: grossly normal mood and affect, speech fluent and appropriate Neurologic: grossly non-focal.  Wt Readings from Last 3 Encounters:  04/22/15 56.7 kg (125 lb)  03/10/15 54.885 kg (121 lb)  03/08/15 55.883 kg (123 lb 3.2 oz)    Labs on Admission:  Basic Metabolic Panel:  Recent Labs Lab 04/22/15 0929  NA 137  K 2.8*  CL 98*  CO2 26  GLUCOSE 118*  BUN 8  CREATININE 0.58  CALCIUM 9.3     Liver Function Tests:  Recent Labs Lab 04/22/15 0929  AST 55*  ALT 48  ALKPHOS 137*  BILITOT 0.8  PROT 8.3*  ALBUMIN 3.4*    Recent Labs Lab 04/22/15 0929  LIPASE 16    CBC:  Recent Labs Lab 04/22/15 0929  WBC 15.3*  NEUTROABS 13.1*  HGB 11.0*  HCT 33.9*  MCV 89.2  PLT 517*    Radiological Exams on Admission: Ct Abdomen Pelvis W Contrast  04/22/2015  CLINICAL DATA:  Initial encounter for 1 week history of nausea and vomiting with fever and chills. Right upper quadrant pain and constipation. EXAM: CT ABDOMEN AND PELVIS WITH CONTRAST TECHNIQUE: Multidetector CT imaging of the abdomen and pelvis was performed using the  standard protocol following bolus administration of intravenous contrast. CONTRAST:  OMNIPAQUE IOHEXOL 300 MG/ML  SOLN COMPARISON:  03/08/2015. FINDINGS: Lower chest: The lungs are clear wiithout focal pneumonia, edema, unremarkable Hepatobiliary: There is a new 3.2 x 4.7 x 3.2 cm cystic lesion in the posterior hepatic dome with rim enhancement and a collar of altered perfusion/parenchymal edema. A second smaller 13 mm satellite lesion with similar features is seen just medial to the dominant abnormality. Ill-defined low-attenuation in the subcapsular aspect of the anterior medial segment left liver was present previously and may represent focal fatty deposition. Gallbladder is surgically absent. No intra or extrahepatic biliary duct dilatation. Common bile duct drain is visualized in situ. The small 16 mm rim enhancing fluid collection seen in the gallbladder fossa on the previous study has resolved. A cluster of small calcifications seen in the subhepatic space just anterior to the right kidney persist and may represent spill gallstones. Pancreas: No focal mass lesion. No dilatation of the main duct. No intraparenchymal cyst. No peripancreatic edema. Spleen: No splenomegaly. No focal mass lesion. Adrenals/Urinary Tract: No adrenal nodule or mass. Kidneys  are unremarkable. No evidence for hydroureter. The urinary bladder appears normal for the degree of distention. Stomach/Bowel: Stomach is nondistended. No gastric wall thickening. No evidence of outlet obstruction. Contrast material in the distal esophagus suggests reflux. Duodenum is normally positioned as is the ligament of Treitz. No small bowel wall thickening. No small bowel dilatation. The terminal ileum is normal. The appendix is not visualized, but there is no edema or inflammation in the region of the cecum. No gross colonic mass. No colonic wall thickening. No substantial diverticular change. Vascular/Lymphatic: There is abdominal aortic atherosclerosis without aneurysm. There is no gastrohepatic or hepatoduodenal ligament lymphadenopathy. No intraperitoneal or retroperitoneal lymphadenopy. No pelvic sidewall lymphadenopathy. Reproductive: Uterus is surgically absent. There is no adnexal mass. Other: No intraperitoneal free fluid. Musculoskeletal: Bone windows reveal no worrisome lytic or sclerotic osseous lesions. IMPRESSION: 1. Interval development of a new 4.7 cm rim enhancing fluid collection in the dome of the liver. Imaging features highly suspicious for intrahepatic abscess. A second adjacent smaller fluid collection is compatible with a satellite lesion. 2. 16 mm rim enhancing fluid collection seen in the cholecystectomy bed on the previous study has resolved in the interval. 3. Probable small gallstones again noted in the subhepatic space. 4. No evidence for biliary dilatation in this patient with common bile duct stent in situ. I discussed these findings by telephone with Dr. Rubin Payor at approximate lead 1238 hours on 04/22/2015. Electronically Signed   By: Kennith Center M.D.   On: 04/22/2015 12:39      Principal Problem:   Liver abscess Active Problems:   Abdominal pain   A/P 1. Intrahepatic abscess, likely delayed complication of cholecystectomy, bile leak and drain placement.  Subjective fever, chills at home. 2. Hypokalemia  3. PMH Multiple large gastric ulcers 02/2015, thought secondary to NSAID use. 4. PMH CAD, s/p stent placement 5. SLE 6. S/p cholecystectomy 01/2015. Study negative for bile leak 02/2015. 7. CBD in situ     VSS, afebrile. Will need IR consult for drain placement. Discussed with patient, she prefers admission here at [facility]. Per EDP discussion with IR, should be able to have drain placed 1/1 in [place], then will return. IV abx started given systemic symptoms, uncertain time of procedure.  Replace potassium  Continue PPI and sucralfate  CMP and CBC in AM  Code Status: Full  DVT prophylaxis:SCDs Family Communication: Discussed  with patient who understands and has no concerns at this time. Disposition Plan/Anticipated LOS: Admit  Time spent: 55 minutes  Brendia Sacksaniel Goodrich, MD  [facility] Pager [phone] 04/22/2015, 12:43 PM    By signing my name below, I, Zadie CleverlyJessica Augustus attest that this documentation has been prepared under the direction and in the presence of Brendia Sacksaniel Goodrich, MD Electronically signed: Zadie CleverlyJessica Augustus  04/22/2015 1:44pm   I personally performed the services described in this documentation. All medical record entries made by the scribe were at my direction. I have reviewed the chart and agree that the record reflects my personal performance and is accurate and complete. Brendia Sacksaniel Goodrich, MD

## 2015-04-22 NOTE — Progress Notes (Signed)
ANTIBIOTIC CONSULT NOTE - INITIAL  Pharmacy Consult for zosyn Indication: intra-abdominal  Allergies  Allergen Reactions  . Hydrocodone-Acetaminophen Nausea And Vomiting    Patient Measurements: Height: 5' (152.4 cm) Weight: 119 lb 14.9 oz (54.4 kg) IBW/kg (Calculated) : 45.5  Vital Signs: Temp: 101.6 F (38.7 C) (12/31 1622) Temp Source: Oral (12/31 1622) BP: 202/73 mmHg (12/31 1622) Pulse Rate: 103 (12/31 1622) Intake/Output from previous day:   Intake/Output from this shift:    Labs:  Recent Labs  04/22/15 0929  WBC 15.3*  HGB 11.0*  PLT 517*  CREATININE 0.58   Estimated Creatinine Clearance: 53 mL/min (by C-G formula based on Cr of 0.58). No results for input(s): VANCOTROUGH, VANCOPEAK, VANCORANDOM, GENTTROUGH, GENTPEAK, GENTRANDOM, TOBRATROUGH, TOBRAPEAK, TOBRARND, AMIKACINPEAK, AMIKACINTROU, AMIKACIN in the last 72 hours.   Microbiology: Recent Results (from the past 720 hour(s))  Blood culture (routine x 2)     Status: None (Preliminary result)   Collection Time: 04/22/15  1:13 PM  Result Value Ref Range Status   Specimen Description BLOOD RIGHT WRIST  Final   Special Requests BOTTLES DRAWN AEROBIC AND ANAEROBIC 6CC  Final   Culture PENDING  Incomplete   Report Status PENDING  Incomplete  Blood culture (routine x 2)     Status: None (Preliminary result)   Collection Time: 04/22/15  1:19 PM  Result Value Ref Range Status   Specimen Description BLOOD LEFT ARM  Final   Special Requests BOTTLES DRAWN AEROBIC AND ANAEROBIC 6CC  Final   Culture PENDING  Incomplete   Report Status PENDING  Incomplete    Medical History: Past Medical History  Diagnosis Date  . Myocardial infarction (HCC) 2012   . CVD (cardiovascular disease)     Stent to mid RCA 2012 [name] 2.5 X 12 mm  . Systemic lupus (HCC)   . Anxiety   . Hypertension   . Arthritis   . Hypercholesterolemia   . PUD (peptic ulcer disease)     Dr. in [place] in early 2016    Medications:  See med  rec  Assessment: 59 yo female presents with fever, chills, abdominal pain. CT abdomen revealed liver abscess. Empiric antibiotic coverage with zosyn. Blood cultures collected. Elevated WBC.   Goal of Therapy:  eradication of infection  Plan:  Zosyn 3.375g iv q8h extended dosing interval Follow up culture results Monitor labs and V/S  Elder CyphersLorie Chelli Yerkes, BS Loura BackPharm D, BCPS Clinical Pharmacist Pager [phone] 04/22/2015,5:49 PM

## 2015-04-22 NOTE — ED Notes (Signed)
Attempted report 

## 2015-04-22 NOTE — ED Notes (Signed)
Pt states that she had an ERCP done and since then has been having chills, vomiting, abdominal pain, and generally not feeling well for about a week.  Pt does not remember exactly what was done with her procedure.

## 2015-04-22 NOTE — ED Notes (Signed)
Spoke to Dr. Irene LimboGoodrich re: increased pain. New order to be placed, per Dr. Irene LimboGoodrich.

## 2015-04-22 NOTE — ED Notes (Signed)
Pt rates pain of 7 and states she needs something stronger than Tylenol. Dr. Irene LimboGoodrich paged.

## 2015-04-23 ENCOUNTER — Ambulatory Visit (HOSPITAL_COMMUNITY)
Admission: EM | Admit: 2015-04-23 | Discharge: 2015-04-23 | Disposition: A | Discharge status: Home / Self Care | Payer: Medicare Other | Source: Other Acute Inpatient Hospital | Attending: Family Medicine | Admitting: Family Medicine

## 2015-04-23 ENCOUNTER — Ambulatory Visit (HOSPITAL_COMMUNITY): Admission: RE | Admit: 2015-04-23 | Payer: Medicare Other | Admitting: Family Medicine

## 2015-04-23 ENCOUNTER — Ambulatory Visit (HOSPITAL_COMMUNITY): Payer: Medicare Other

## 2015-04-23 DIAGNOSIS — M329 Systemic lupus erythematosus, unspecified: Secondary | ICD-10-CM

## 2015-04-23 DIAGNOSIS — K750 Abscess of liver: Secondary | ICD-10-CM | POA: Diagnosis not present

## 2015-04-23 DIAGNOSIS — K279 Peptic ulcer, site unspecified, unspecified as acute or chronic, without hemorrhage or perforation: Secondary | ICD-10-CM

## 2015-04-23 LAB — MAGNESIUM: MAGNESIUM: 1.8 mg/dL (ref 1.7–2.4)

## 2015-04-23 LAB — COMPREHENSIVE METABOLIC PANEL
ALT: 35 U/L (ref 14–54)
AST: 31 U/L (ref 15–41)
Albumin: 2.5 g/dL — ABNORMAL LOW (ref 3.5–5.0)
Alkaline Phosphatase: 106 U/L (ref 38–126)
Anion gap: 10 (ref 5–15)
BILIRUBIN TOTAL: 0.7 mg/dL (ref 0.3–1.2)
CO2: 28 mmol/L (ref 22–32)
Calcium: 8.5 mg/dL — ABNORMAL LOW (ref 8.9–10.3)
Chloride: 100 mmol/L — ABNORMAL LOW (ref 101–111)
Creatinine, Ser: 0.53 mg/dL (ref 0.44–1.00)
GFR calc Af Amer: >60 mL/min (ref >60)
Glucose, Bld: 134 mg/dL — ABNORMAL HIGH (ref 65–99)
POTASSIUM: 2.4 mmol/L — AB (ref 3.5–5.1)
Sodium: 138 mmol/L (ref 135–145)
TOTAL PROTEIN: 6.4 g/dL — AB (ref 6.5–8.1)

## 2015-04-23 LAB — APTT: aPTT: 37 seconds (ref 24–37)

## 2015-04-23 LAB — CBC
HEMATOCRIT: 28.2 % — AB (ref 36.0–46.0)
Hemoglobin: 9 g/dL — ABNORMAL LOW (ref 12.0–15.0)
MCH: 28.3 pg (ref 26.0–34.0)
MCHC: 31.9 g/dL (ref 30.0–36.0)
MCV: 88.7 fL (ref 78.0–100.0)
PLATELETS: 495 10*3/uL — AB (ref 150–400)
RBC: 3.18 MIL/uL — ABNORMAL LOW (ref 3.87–5.11)
RDW: 15.4 % (ref 11.5–15.5)
WBC: 11.4 10*3/uL — AB (ref 4.0–10.5)

## 2015-04-23 LAB — PROTIME-INR
INR: 1.21 (ref 0.00–1.49)
Prothrombin Time: 15.4 seconds — ABNORMAL HIGH (ref 11.6–15.2)

## 2015-04-23 LAB — POTASSIUM: POTASSIUM: 2.8 mmol/L — AB (ref 3.5–5.1)

## 2015-04-23 MED ORDER — POTASSIUM CHLORIDE 10 MEQ/100ML IV SOLN
10.0000 meq | INTRAVENOUS | Status: AC
  Administered 2015-04-23 (×6): 10 meq via INTRAVENOUS
  Filled 2015-04-23 (×2): qty 100

## 2015-04-23 MED ORDER — MIDAZOLAM HCL 2 MG/2ML IJ SOLN
INTRAMUSCULAR | Status: AC | PRN
  Administered 2015-04-23: 0.5 mg via INTRAVENOUS
  Administered 2015-04-23: 1 mg via INTRAVENOUS
  Administered 2015-04-23: 0.5 mg via INTRAVENOUS

## 2015-04-23 MED ORDER — MORPHINE SULFATE (PF) 2 MG/ML IV SOLN
2.0000 mg | Freq: Once | INTRAVENOUS | Status: AC
  Administered 2015-04-23: 2 mg via INTRAVENOUS
  Filled 2015-04-23: qty 1

## 2015-04-23 MED ORDER — ACETAMINOPHEN 325 MG PO TABS
650.0000 mg | ORAL_TABLET | Freq: Once | ORAL | Status: AC
  Administered 2015-04-23: 650 mg via ORAL
  Filled 2015-04-23: qty 2

## 2015-04-23 MED ORDER — FENTANYL CITRATE (PF) 100 MCG/2ML IJ SOLN
INTRAMUSCULAR | Status: AC | PRN
  Administered 2015-04-23: 25 ug via INTRAVENOUS
  Administered 2015-04-23: 50 ug via INTRAVENOUS
  Administered 2015-04-23: 25 ug via INTRAVENOUS

## 2015-04-23 MED ORDER — KETOROLAC TROMETHAMINE 30 MG/ML IJ SOLN
30.0000 mg | Freq: Once | INTRAMUSCULAR | Status: AC
  Administered 2015-04-23: 30 mg via INTRAVENOUS
  Filled 2015-04-23: qty 1

## 2015-04-23 NOTE — Progress Notes (Signed)
report given to [facility] RN. RRT at bedside. Pt safe for transport. Pt states pain is better and she is feeling a lot better.

## 2015-04-23 NOTE — Consult Note (Signed)
Chief Complaint: Patient was seen in consultation today for hepatic abscess drain placement at the request of Goodrich,Daniel P  Referring Physician(s): Goodrich,Daniel P  History of Present Illness: Sheila [name] is a 60 y.o. female   Pt with cholecystectomy 01/2015 Developed bile leak Now with hepatic abscess IMPRESSION: 1. Interval development of a new 4.7 cm rim enhancing fluid collection in the dome of the liver. Imaging features highly suspicious for intrahepatic abscess. A second adjacent smaller fluid collection is compatible with a satellite lesion. 2. 16 mm rim enhancing fluid collection seen in the cholecystectomy bed on the previous study has resolved in the interval. 3. Probable small gallstones again noted in the subhepatic space. 4. No evidence for biliary dilatation in this patient with common bile duct stent in situ. I discussed these findings by telephone with Dr. Rubin PayorPickering at approximate lead 1238 hours on 04/22/2015.  Request made for abscess drain placement Dr Fredia SorrowYamagata has reviewed imaging and approves procedure I have seen and examined pt  Past Medical History  Diagnosis Date  . Myocardial infarction (HCC) 2012   . CVD (cardiovascular disease)     Stent to mid RCA 2012 [name] 2.5 X 12 mm  . Systemic lupus (HCC)   . Anxiety   . Hypertension   . Arthritis   . Hypercholesterolemia   . PUD (peptic ulcer disease)     Dr. in [place] in early 2016    Past Surgical History  Procedure Laterality Date  . Coronary angioplasty with stent placement  2012, 2013.    [name] DES to mid RCA. Stents X 2 to unknown artery  . Abdominal hysterectomy    . Tubal ligation    . Breast lumpectomy    . Cholecystectomy N/A 01/27/2015    Procedure: CHOLECYSTECTOMY;  Surgeon: Franky MachoMark Jenkins, MD;  Location: [facility];  Service: General;  Laterality: N/A;  converted to open at 0941  . Esophagogastroduodenoscopy      [place]:PUD per patient  . [facility]  [facility]  [facility].   . Ercp N/A 02/08/2015    XBJ:YNWG-NFRMR:post-op biliary leak/s/p stent placement  . Biliary stent placement N/A 02/08/2015    Procedure: BILIARY STENT PLACEMENT;  Surgeon: Corbin Adeobert M Rourk, MD;  Location: [facility];  Service: Endoscopy;  Laterality: N/A;  . Sphincterotomy N/A 02/08/2015    Procedure: SPHINCTEROTOMY;  Surgeon: Corbin Adeobert M Rourk, MD;  Location: [facility];  Service: Endoscopy;  Laterality: N/A;  . Esophagogastroduodenoscopy N/A 03/10/2015    AOZ:HYQMVHQISLF:multiple large gastric ulcers in gastric antrum at the angularis and in the cardia/bile duct stent seen in the scond portion    Allergies: Hydrocodone-acetaminophen  Medications: Prior to Admission medications   Medication Sig Start Date End Date Taking? Authorizing Provider  acetaminophen (TYLENOL 8 HOUR) 650 MG CR tablet Take 650 mg by mouth every 8 (eight) hours as needed for pain.    Historical Provider, MD  amLODipine (NORVASC) 10 MG tablet Take 1 tablet (10 mg total) by mouth daily. Patient not taking: Reported on 04/22/2015 03/07/15   Jodelle GrossKathryn M Lawrence, NP  cloNIDine (CATAPRES) 0.2 MG tablet Take 1 tablet (0.2 mg total) by mouth 2 (two) times daily. 03/07/15   Jodelle GrossKathryn M Lawrence, NP  escitalopram (LEXAPRO) 10 MG tablet Take 10 mg by mouth daily.     Historical Provider, MD  isosorbide mononitrate (IMDUR) 30 MG 24 hr tablet Take 1 tablet (30 mg total) by mouth daily. Patient not taking: Reported on 04/22/2015 02/23/15   Nishant Dhungel,  MD  metoprolol (LOPRESSOR) 50 MG tablet Take 50 mg by mouth 3 (three) times daily.    Historical Provider, MD  omeprazole (PRILOSEC) 40 MG capsule Take 40 mg by mouth daily.    Historical Provider, MD  OVER THE COUNTER MEDICATION Take 1 tablet by mouth daily. eZ Melts: Zinc Carnosine 37.5 mg    Historical Provider, MD  OVER THE COUNTER MEDICATION Take 2 tablets by mouth 2 (two) times daily. NOW FOODS:  S[place]m 400 mg    Historical Provider, MD  oxyCODONE-acetaminophen (PERCOCET) 7.5-325 MG tablet Take  1-2 tablets by mouth every 4 (four) hours as needed for severe pain. Patient not taking: Reported on 04/22/2015 03/12/15   Erick Blinks, MD  pantoprazole (PROTONIX) 40 MG tablet Take 1 tablet (40 mg total) by mouth 2 (two) times daily before a meal. Patient not taking: Reported on 04/22/2015 03/12/15   Erick Blinks, MD  sucralfate (CARAFATE) 1 G tablet Take 1 tablet (1 g total) by mouth 4 (four) times daily. 03/13/15   Anice Paganini, NP     Family History  Problem Relation Age of Onset  . CAD Father     CABG  . Heart attack Brother     PTCA  . Hypertension Sister   . Hypertension Father   . Colon cancer Neg Hx   . Liver disease Neg Hx     Social History   Social History  . Marital Status: Legally Separated    Spouse Name: N/A  . Number of Children: 2  . Years of Education: N/A   Occupational History  . Nutritionist    Social History Main Topics  . Smoking status: Former Smoker    Types: Cigarettes  . Smokeless tobacco: Never Used  . Alcohol Use: No  . Drug Use: No  . Sexual Activity: No   Other Topics Concern  . Not on file   Social History Narrative   Lives alone. Moved from [place] 2 years ago.     Review of Systems: A 12 point ROS discussed and pertinent positives are indicated in the HPI above.  All other systems are negative.  Review of Systems  Constitutional: Positive for activity change. Negative for fever, appetite change and fatigue.  Respiratory: Negative for shortness of breath.   Cardiovascular: Negative for chest pain.  Gastrointestinal: Positive for nausea and abdominal pain.  Neurological: Positive for weakness.  Psychiatric/Behavioral: Negative for behavioral problems and confusion.    Vital Signs: There were no vitals taken for this visit.  Physical Exam  Constitutional: She is oriented to person, place, and time.  Cardiovascular: Normal rate, regular rhythm and normal heart sounds.   Pulmonary/Chest: Effort normal.  Abdominal: Soft.  There is tenderness.  Musculoskeletal: Normal range of motion.  Neurological: She is alert and oriented to person, place, and time.  Skin: Skin is warm.  Psychiatric: She has a normal mood and affect. Her behavior is normal. Judgment and thought content normal.  Nursing note and vitals reviewed.   Mallampati Score:  MD Evaluation Airway: WNL Heart: WNL Abdomen: WNL Chest/ Lungs: WNL ASA  Classification: 2 Mallampati/Airway Score: Two  Imaging: Ct Abdomen Pelvis W Contrast  04/22/2015  CLINICAL DATA:  Initial encounter for 1 week history of nausea and vomiting with fever and chills. Right upper quadrant pain and constipation. EXAM: CT ABDOMEN AND PELVIS WITH CONTRAST TECHNIQUE: Multidetector CT imaging of the abdomen and pelvis was performed using the standard protocol following bolus administration of intravenous contrast. CONTRAST:  OMNIPAQUE IOHEXOL 300 MG/ML  SOLN COMPARISON:  03/08/2015. FINDINGS: Lower chest: The lungs are clear wiithout focal pneumonia, edema, unremarkable Hepatobiliary: There is a new 3.2 x 4.7 x 3.2 cm cystic lesion in the posterior hepatic dome with rim enhancement and a collar of altered perfusion/parenchymal edema. A second smaller 13 mm satellite lesion with similar features is seen just medial to the dominant abnormality. Ill-defined low-attenuation in the subcapsular aspect of the anterior medial segment left liver was present previously and may represent focal fatty deposition. Gallbladder is surgically absent. No intra or extrahepatic biliary duct dilatation. Common bile duct drain is visualized in situ. The small 16 mm rim enhancing fluid collection seen in the gallbladder fossa on the previous study has resolved. A cluster of small calcifications seen in the subhepatic space just anterior to the right kidney persist and may represent spill gallstones. Pancreas: No focal mass lesion. No dilatation of the main duct. No intraparenchymal cyst. No  peripancreatic edema. Spleen: No splenomegaly. No focal mass lesion. Adrenals/Urinary Tract: No adrenal nodule or mass. Kidneys are unremarkable. No evidence for hydroureter. The urinary bladder appears normal for the degree of distention. Stomach/Bowel: Stomach is nondistended. No gastric wall thickening. No evidence of outlet obstruction. Contrast material in the distal esophagus suggests reflux. Duodenum is normally positioned as is the ligament of Treitz. No small bowel wall thickening. No small bowel dilatation. The terminal ileum is normal. The appendix is not visualized, but there is no edema or inflammation in the region of the cecum. No gross colonic mass. No colonic wall thickening. No substantial diverticular change. Vascular/Lymphatic: There is abdominal aortic atherosclerosis without aneurysm. There is no gastrohepatic or hepatoduodenal ligament lymphadenopathy. No intraperitoneal or retroperitoneal lymphadenopy. No pelvic sidewall lymphadenopathy. Reproductive: Uterus is surgically absent. There is no adnexal mass. Other: No intraperitoneal free fluid. Musculoskeletal: Bone windows reveal no worrisome lytic or sclerotic osseous lesions. IMPRESSION: 1. Interval development of a new 4.7 cm rim enhancing fluid collection in the dome of the liver. Imaging features highly suspicious for intrahepatic abscess. A second adjacent smaller fluid collection is compatible with a satellite lesion. 2. 16 mm rim enhancing fluid collection seen in the cholecystectomy bed on the previous study has resolved in the interval. 3. Probable small gallstones again noted in the subhepatic space. 4. No evidence for biliary dilatation in this patient with common bile duct stent in situ. I discussed these findings by telephone with Dr. Rubin Payor at approximate lead 1238 hours on 04/22/2015. Electronically Signed   By: Kennith Center M.D.   On: 04/22/2015 12:39    Labs:  CBC:  Recent Labs  03/09/15 0614 03/10/15 0650  04/22/15 0929 04/23/15 0638  WBC 9.9 12.2* 15.3* 11.4*  HGB 10.2* 9.5* 11.0* 9.0*  HCT 33.4* 31.3* 33.9* 28.2*  PLT 539* 482* 517* 495*    COAGS:  Recent Labs  04/23/15 0836  INR 1.21  APTT 37    BMP:  Recent Labs  03/09/15 0614 03/10/15 0650 04/22/15 0929 04/23/15 0638  NA 144 142 137 138  K 4.3 4.5 2.8* 2.4*  CL 111 111 98* 100*  CO2 28 25 26 28   GLUCOSE 123* 114* 118* 134*  BUN 12 8 8  <5*  CALCIUM 9.2 9.3 9.3 8.5*  CREATININE 0.54 0.54 0.58 0.53  GFRNONAA >60 >60 >60 >60  GFRAA >60 >60 >60 >60    LIVER FUNCTION TESTS:  Recent Labs  03/08/15 1331 03/09/15 0614 04/22/15 0929 04/23/15 0638  BILITOT 0.7 0.5 0.8 0.7  AST 39 31 55* 31  ALT 32 27 48 35  ALKPHOS 116 95 137* 106  PROT 8.4* 6.9 8.3* 6.4*  ALBUMIN 4.1 3.3* 3.4* 2.5*    TUMOR MARKERS: No results for input(s): AFPTM, CEA, CA199, CHROMGRNA in the last 8760 hours.  Assessment and Plan:  Hepatic abscess post cholecystectomy 01/2015 Now scheduled for hepatic abscess drain placement Risks and Benefits discussed with the patient including bleeding, infection, damage to adjacent structures, bowel perforation/fistula connection, and sepsis. All of the patient's questions were answered, patient is agreeable to proceed. Consent signed and in chart.   Thank you for this interesting consult.  I greatly enjoyed meeting Sheila [name] and look forward to participating in their care.  A copy of this report was sent to the requesting provider on this date.  Signed: Walida Cajas A 04/23/2015, 11:11 AM   I spent a total of 40 Minutes    in face to face in clinical consultation, greater than 50% of which was counseling/coordinating care for hepatic abscess drain placement

## 2015-04-23 NOTE — Progress Notes (Signed)
[facility] PROGRESS NOTE  Wrenna Saks ZOX:[id number] DOB: 1955/06/14 DOA: 04/22/2015 PCP: [name], MD  Assessment/Plan: 1. Intrahepatic abscess, likely delayed complication of cholecystectomy, bile leak and drain placement. Noted to have high fevers today. Patient was transported to [facility] today for drain placement then returned to [facility]. Started on empiric abx with zosyn. Will follow up cultures and likely need ID input.  2. Hypokalemia, Continue to replete. Magnesium in normal range  3. PMH Multiple large gastric ulcers 02/2015, thought secondary to NSAID use. Continue PPI and sucralfate.  4. PMH CAD, s/p stent placement. No complaints of chest pain at present 5. SLE. No complaints of joint pain or rash 6. S/p cholecystectomy 01/2015. Study negative for bile leak 02/2015.   Code Status: Full DVT prophylaxis: SCDs Family Communication: discussed with patient  Disposition Plan: discharge home once improved   Consultants:  Interventional radiology  Procedures:  CT guided intrahepatic drain placement 04/23/15  Antibiotics:  Zosyn 12/31>>  HPI/Subjective: Has some pain in RUQ where drain was placed, but overall feeling better  Objective: Filed Vitals:   04/22/15 2135 04/23/15 0613  BP: 177/78 141/49  Pulse: 79 87  Temp: 98.7 F (37.1 C) 98 F (36.7 C)  Resp: 20 20    Intake/Output Summary (Last 24 hours) at 04/23/15 0835 Last data filed at 04/23/15 0615  Gross per 24 hour  Intake      0 ml  Output    350 ml  Net   -350 ml   Filed Weights   04/22/15 0855 04/22/15 1556  Weight: 56.7 kg (125 lb) 54.4 kg (119 lb 14.9 oz)    Exam:  General: NAD, looks comfortable Cardiovascular: RRR, S1, S2  Respiratory: clear bilaterally, No wheezing, rales or rhonchi Abdomen: soft,diffusely tender, no distention , bowel sounds normal, drain present in RUQ Musculoskeletal: No edema b/l  Data Reviewed: Basic Metabolic Panel:  Recent Labs Lab 04/22/15 0929  04/23/15 0638  NA 137 138  K 2.8* 2.4*  CL 98* 100*  CO2 26 28  GLUCOSE 118* 134*  BUN 8 <5*  CREATININE 0.58 0.53  CALCIUM 9.3 8.5*   Liver Function Tests:  Recent Labs Lab 04/22/15 0929 04/23/15 0638  AST 55* 31  ALT 48 35  ALKPHOS 137* 106  BILITOT 0.8 0.7  PROT 8.3* 6.4*  ALBUMIN 3.4* 2.5*    Recent Labs Lab 04/22/15 0929  LIPASE 16   No results for input(s): AMMONIA in the last 168 hours. CBC:  Recent Labs Lab 04/22/15 0929 04/23/15 0638  WBC 15.3* 11.4*  NEUTROABS 13.1*  --   HGB 11.0* 9.0*  HCT 33.9* 28.2*  MCV 89.2 88.7  PLT 517* 495*     Recent Results (from the past 240 hour(s))  Blood culture (routine x 2)     Status: None (Preliminary result)   Collection Time: 04/22/15  1:13 PM  Result Value Ref Range Status   Specimen Description BLOOD RIGHT WRIST  Final   Special Requests BOTTLES DRAWN AEROBIC AND ANAEROBIC 6CC  Final   Culture PENDING  Incomplete   Report Status PENDING  Incomplete  Blood culture (routine x 2)     Status: None (Preliminary result)   Collection Time: 04/22/15  1:19 PM  Result Value Ref Range Status   Specimen Description BLOOD LEFT ARM  Final   Special Requests BOTTLES DRAWN AEROBIC AND ANAEROBIC 6CC  Final   Culture PENDING  Incomplete   Report Status PENDING  Incomplete     Studies: Ct  Abdomen Pelvis W Contrast  04/22/2015  CLINICAL DATA:  Initial encounter for 1 week history of nausea and vomiting with fever and chills. Right upper quadrant pain and constipation. EXAM: CT ABDOMEN AND PELVIS WITH CONTRAST TECHNIQUE: Multidetector CT imaging of the abdomen and pelvis was performed using the standard protocol following bolus administration of intravenous contrast. CONTRAST:  OMNIPAQUE IOHEXOL 300 MG/ML  SOLN COMPARISON:  03/08/2015. FINDINGS: Lower chest: The lungs are clear wiithout focal pneumonia, edema, unremarkable Hepatobiliary: There is a new 3.2 x 4.7 x 3.2 cm cystic lesion in the posterior hepatic dome  with rim enhancement and a collar of altered perfusion/parenchymal edema. A second smaller 13 mm satellite lesion with similar features is seen just medial to the dominant abnormality. Ill-defined low-attenuation in the subcapsular aspect of the anterior medial segment left liver was present previously and may represent focal fatty deposition. Gallbladder is surgically absent. No intra or extrahepatic biliary duct dilatation. Common bile duct drain is visualized in situ. The small 16 mm rim enhancing fluid collection seen in the gallbladder fossa on the previous study has resolved. A cluster of small calcifications seen in the subhepatic space just anterior to the right kidney persist and may represent spill gallstones. Pancreas: No focal mass lesion. No dilatation of the main duct. No intraparenchymal cyst. No peripancreatic edema. Spleen: No splenomegaly. No focal mass lesion. Adrenals/Urinary Tract: No adrenal nodule or mass. Kidneys are unremarkable. No evidence for hydroureter. The urinary bladder appears normal for the degree of distention. Stomach/Bowel: Stomach is nondistended. No gastric wall thickening. No evidence of outlet obstruction. Contrast material in the distal esophagus suggests reflux. Duodenum is normally positioned as is the ligament of Treitz. No small bowel wall thickening. No small bowel dilatation. The terminal ileum is normal. The appendix is not visualized, but there is no edema or inflammation in the region of the cecum. No gross colonic mass. No colonic wall thickening. No substantial diverticular change. Vascular/Lymphatic: There is abdominal aortic atherosclerosis without aneurysm. There is no gastrohepatic or hepatoduodenal ligament lymphadenopathy. No intraperitoneal or retroperitoneal lymphadenopy. No pelvic sidewall lymphadenopathy. Reproductive: Uterus is surgically absent. There is no adnexal mass. Other: No intraperitoneal free fluid. Musculoskeletal: Bone windows reveal no  worrisome lytic or sclerotic osseous lesions. IMPRESSION: 1. Interval development of a new 4.7 cm rim enhancing fluid collection in the dome of the liver. Imaging features highly suspicious for intrahepatic abscess. A second adjacent smaller fluid collection is compatible with a satellite lesion. 2. 16 mm rim enhancing fluid collection seen in the cholecystectomy bed on the previous study has resolved in the interval. 3. Probable small gallstones again noted in the subhepatic space. 4. No evidence for biliary dilatation in this patient with common bile duct stent in situ. I discussed these findings by telephone with Dr. Rubin Payor at approximate lead 1238 hours on 04/22/2015. Electronically Signed   By: Kennith Center M.D.   On: 04/22/2015 12:39    Scheduled Meds: . cloNIDine  0.2 mg Oral BID  . escitalopram  10 mg Oral Daily  . feeding supplement (ENSURE ENLIVE)  237 mL Oral BID BM  . metoprolol  50 mg Oral TID  . pantoprazole  40 mg Oral Daily  . piperacillin-tazobactam (ZOSYN)  IV  3.375 g Intravenous 3 times per day  . potassium chloride  10 mEq Intravenous Q1 Hr x 6  . potassium chloride  40 mEq Oral Once  . sodium chloride  3 mL Intravenous Q12H  . sucralfate  1 g Oral  QID   Continuous Infusions:   Principal Problem:   Liver abscess Active Problems:   Abdominal pain    Time spent: 25minutes     Erick BlinksMemon, Mionna Advincula, MD  [facility] Pager [phone]. If 7PM-7AM, please contact night-coverage at [email or web address], password [email or web address] 04/23/2015, 8:35 AM  LOS: 1 day

## 2015-04-23 NOTE — Sedation Documentation (Signed)
Vital signs stable. 

## 2015-04-23 NOTE — Progress Notes (Signed)
Pt complains of excrutiating pain. MD notified, RRT called.

## 2015-04-23 NOTE — Progress Notes (Signed)
Patient ID: Sheila [name], female   DOB: 21-Jan-1956, 60 y.o.   MRN: [id number]   Pt with liver abscess Post cholecystectomy 01/2015  Now for abscess drain placement Dr Fredia SorrowYamagata has approved procedure  Discussed with RN [facility] coags to be drawn stat before transport  [facility]

## 2015-04-23 NOTE — Sedation Documentation (Signed)
Vital signs stable. Pt states she has abdominal pain

## 2015-04-23 NOTE — Progress Notes (Signed)
Pt condition continues to worsen. Rigors present. Tylenol ordered, pt febrile at 102

## 2015-04-23 NOTE — Sedation Documentation (Signed)
Patient is resting comfortably. 

## 2015-04-23 NOTE — Progress Notes (Signed)
Patient c/o pain and her pain medicine is not due until 0643. Paged on call MD, will follow any new orders received and continue to monitor the patient.

## 2015-04-23 NOTE — Procedures (Signed)
Interventional Radiology Procedure Note  Procedure:  CT guided percutaneous drainage of hepatic abscess  Complications:  None  Estimated Blood Loss: < 10 mL  Right lobe hepatic abscess yielded purulent fluid.  Sample sent for culture analysis. 10 Fr percutaneous drain placed and attached to suction bulb.  Plan:  Patient will likely require drain for at least 2-3 weeks. Follow up as outpatient for drain management can be made with IR Clinic in [place] at [phone]. Consider follow up with ID for antibiotic management.  Jodi MarbleGlenn T. Fredia SorrowYamagata, M.D Pager:  [phone]

## 2015-04-23 NOTE — Sedation Documentation (Signed)
Patient is resting. Vitals stable. 

## 2015-04-24 DIAGNOSIS — R1010 Upper abdominal pain, unspecified: Secondary | ICD-10-CM

## 2015-04-24 LAB — CBC
HCT: 29.4 % — ABNORMAL LOW (ref 36.0–46.0)
HEMOGLOBIN: 9.4 g/dL — AB (ref 12.0–15.0)
MCH: 28.7 pg (ref 26.0–34.0)
MCHC: 32 g/dL (ref 30.0–36.0)
MCV: 89.6 fL (ref 78.0–100.0)
PLATELETS: 242 10*3/uL (ref 150–400)
RBC: 3.28 MIL/uL — ABNORMAL LOW (ref 3.87–5.11)
RDW: 15.6 % — AB (ref 11.5–15.5)
WBC: 17.6 10*3/uL — ABNORMAL HIGH (ref 4.0–10.5)

## 2015-04-24 LAB — BASIC METABOLIC PANEL
Anion gap: 12 (ref 5–15)
BUN: 12 mg/dL (ref 6–20)
CALCIUM: 8.2 mg/dL — AB (ref 8.9–10.3)
CHLORIDE: 101 mmol/L (ref 101–111)
CO2: 24 mmol/L (ref 22–32)
Creatinine, Ser: 1.93 mg/dL — ABNORMAL HIGH (ref 0.44–1.00)
GFR calc non Af Amer: 27 mL/min — ABNORMAL LOW (ref >60)
GFR, EST AFRICAN AMERICAN: 31 mL/min — AB (ref >60)
Glucose, Bld: 149 mg/dL — ABNORMAL HIGH (ref 65–99)
Potassium: 2.9 mmol/L — ABNORMAL LOW (ref 3.5–5.1)
SODIUM: 137 mmol/L (ref 135–145)

## 2015-04-24 MED ORDER — METOPROLOL TARTRATE 25 MG PO TABS
25.0000 mg | ORAL_TABLET | Freq: Once | ORAL | Status: AC
  Administered 2015-04-24: 25 mg via ORAL
  Filled 2015-04-24: qty 1

## 2015-04-24 MED ORDER — SODIUM CHLORIDE 0.9 % IV SOLN
INTRAVENOUS | Status: DC
  Administered 2015-04-24: 11:00:00 via INTRAVENOUS
  Administered 2015-04-24: 1 mL via INTRAVENOUS
  Administered 2015-04-25: 12:00:00 via INTRAVENOUS
  Administered 2015-04-25 – 2015-04-26 (×2): 1 mL via INTRAVENOUS

## 2015-04-24 MED ORDER — POTASSIUM CHLORIDE CRYS ER 20 MEQ PO TBCR
40.0000 meq | EXTENDED_RELEASE_TABLET | Freq: Once | ORAL | Status: AC
  Administered 2015-04-24: 40 meq via ORAL
  Filled 2015-04-24: qty 2

## 2015-04-24 NOTE — Care Management Note (Signed)
Case Management Note  Patient Details  Name: Sheila [name] MRN: [id number] Date of Birth: Feb 26, 1956  Subjective/Objective:                  Pt admitted from home with hepatic abscess s/p drain placement. Pt lives alone and will return home at discharge. Pt is independent with ADl's. Pt will discharge home with drain in place according to attending MD.  Action/Plan: Will arrange [facility] with agency of choice at discharge.  Expected Discharge Date:  04/26/15               Expected Discharge Plan:  Home w Home Health Services  In-House Referral:  NA  Discharge planning Services  CM Consult  Post Acute Care Choice:  Home Health Choice offered to:  Patient  DME Arranged:    DME Agency:     HH Arranged:  RN HH Agency:     Status of Service:  In process, will continue to follow  Medicare Important Message Given:    Date Medicare IM Given:    Medicare IM give by:    Date Additional Medicare IM Given:    Additional Medicare Important Message give by:     If discussed at Long Length of Stay Meetings, dates discussed:    Additional Comments:  [name], Mattie Nordell Crowder, RN 04/24/2015, 11:07 AM

## 2015-04-24 NOTE — Progress Notes (Signed)
[facility] PROGRESS NOTE  Sheila [name] Record [id number] DOB: 1955-04-26 DOA: 04/22/2015 PCP: Geraldo PitterBLAND,VEITA J, MD  Assessment/Plan: 1. Intrahepatic abscess, likely delayed complication of cholecystectomy, bile leak and drain placement. Fever resolved, yet WBC are elevated today. Clinically she does not appear any worst today. Started on empiric abx with zosyn. Cultures are in process. Will likely need to consult ID.  2. Hypokalemia, slowly improving. Will continue to replete. Magnesium noted to be in normal range.  3. Acute kidney injury, multifactorial, possibly related to fluctuation of blood pressure, IV contrast received 12/31 as well as one dose of Toradol received 1/1. Will continue IV hydration and follow urine output. If creatinine does not improve may need nephrology input.  4. PMH Multiple large gastric ulcers 02/2015, thought secondary to NSAID use. Continue PPI and sucralfate.  5. PMH CAD, s/p stent placement. No complaints of chest pain at present 6. SLE. No complaints of joint pain or rash 7. S/p cholecystectomy 01/2015. Study negative for bile leak 02/2015.   Code Status: Full DVT prophylaxis: SCDs Family Communication: Discussed with patient who understands and has no concerns at this time.  Disposition Plan: discharge home once improved   Consultants:  Interventional radiology  Procedures:  CT guided intrahepatic drain placement 04/23/15  Antibiotics:  Zosyn 12/31>>  HPI/Subjective:  Feeling better. The pain is improving but still present. Denies nausea or diarrhea.  Objective: Filed Vitals:   04/23/15 2123 04/24/15 0648  BP: 98/72 150/64  Pulse: 102 105  Temp: 102 F (38.9 C) 98.5 F (36.9 C)  Resp: 20 20    Intake/Output Summary (Last 24 hours) at 04/24/15 1019 Last data filed at 04/24/15 0835  Gross per 24 hour  Intake     13 ml  Output    390 ml  Net   -377 ml   Filed Weights   04/22/15 0855 04/22/15 1556  Weight: 56.7 kg (125 lb) 54.4 kg  (119 lb 14.9 oz)    Exam: General: NAD. Appears calm Cardiovascular: Regular rate and rhythm   Respiratory: clear bilaterally, No wheezing, rales or rhonchi Abdomen:  drain present in RUQ. Mild diffused tenderness in abdomen. Soft  Musculoskeletal: No edema b/l  Data Reviewed: Basic Metabolic Panel:  Recent Labs Lab 04/22/15 0929 04/23/15 0638 04/23/15 0836 04/23/15 1922 04/24/15 0546  NA 137 138  --   --  137  K 2.8* 2.4*  --  2.8* 2.9*  CL 98* 100*  --   --  101  CO2 26 28  --   --  24  GLUCOSE 118* 134*  --   --  149*  BUN 8 <5*  --   --  12  CREATININE 0.58 0.53  --   --  1.93*  CALCIUM 9.3 8.5*  --   --  8.2*  MG  --   --  1.8  --   --    Liver Function Tests:  Recent Labs Lab 04/22/15 0929 04/23/15 0638  AST 55* 31  ALT 48 35  ALKPHOS 137* 106  BILITOT 0.8 0.7  PROT 8.3* 6.4*  ALBUMIN 3.4* 2.5*    Recent Labs Lab 04/22/15 0929  LIPASE 16   CBC:  Recent Labs Lab 04/22/15 0929 04/23/15 0638 04/24/15 0546  WBC 15.3* 11.4* 17.6*  NEUTROABS 13.1*  --   --   HGB 11.0* 9.0* 9.4*  HCT 33.9* 28.2* 29.4*  MCV 89.2 88.7 89.6  PLT 517* 495* 242     Recent Results (from the past 240  hour(s))  Blood culture (routine x 2)     Status: None (Preliminary result)   Collection Time: 04/22/15  1:13 PM  Result Value Ref Range Status   Specimen Description BLOOD RIGHT WRIST  Final   Special Requests BOTTLES DRAWN AEROBIC AND ANAEROBIC 6CC  Final   Culture PENDING  Incomplete   Report Status PENDING  Incomplete  Blood culture (routine x 2)     Status: None (Preliminary result)   Collection Time: 04/22/15  1:19 PM  Result Value Ref Range Status   Specimen Description BLOOD LEFT ARM  Final   Special Requests BOTTLES DRAWN AEROBIC AND ANAEROBIC 6CC  Final   Culture PENDING  Incomplete   Report Status PENDING  Incomplete     Studies: Ct Abdomen Pelvis W Contrast  04/22/2015  CLINICAL DATA:  Initial encounter for 1 week history of nausea and vomiting  with fever and chills. Right upper quadrant pain and constipation. EXAM: CT ABDOMEN AND PELVIS WITH CONTRAST TECHNIQUE: Multidetector CT imaging of the abdomen and pelvis was performed using the standard protocol following bolus administration of intravenous contrast. CONTRAST:  OMNIPAQUE IOHEXOL 300 MG/ML  SOLN COMPARISON:  03/08/2015. FINDINGS: Lower chest: The lungs are clear wiithout focal pneumonia, edema, unremarkable Hepatobiliary: There is a new 3.2 x 4.7 x 3.2 cm cystic lesion in the posterior hepatic dome with rim enhancement and a collar of altered perfusion/parenchymal edema. A second smaller 13 mm satellite lesion with similar features is seen just medial to the dominant abnormality. Ill-defined low-attenuation in the subcapsular aspect of the anterior medial segment left liver was present previously and may represent focal fatty deposition. Gallbladder is surgically absent. No intra or extrahepatic biliary duct dilatation. Common bile duct drain is visualized in situ. The small 16 mm rim enhancing fluid collection seen in the gallbladder fossa on the previous study has resolved. A cluster of small calcifications seen in the subhepatic space just anterior to the right kidney persist and may represent spill gallstones. Pancreas: No focal mass lesion. No dilatation of the main duct. No intraparenchymal cyst. No peripancreatic edema. Spleen: No splenomegaly. No focal mass lesion. Adrenals/Urinary Tract: No adrenal nodule or mass. Kidneys are unremarkable. No evidence for hydroureter. The urinary bladder appears normal for the degree of distention. Stomach/Bowel: Stomach is nondistended. No gastric wall thickening. No evidence of outlet obstruction. Contrast material in the distal esophagus suggests reflux. Duodenum is normally positioned as is the ligament of Treitz. No small bowel wall thickening. No small bowel dilatation. The terminal ileum is normal. The appendix is not visualized, but there is  no edema or inflammation in the region of the cecum. No gross colonic mass. No colonic wall thickening. No substantial diverticular change. Vascular/Lymphatic: There is abdominal aortic atherosclerosis without aneurysm. There is no gastrohepatic or hepatoduodenal ligament lymphadenopathy. No intraperitoneal or retroperitoneal lymphadenopy. No pelvic sidewall lymphadenopathy. Reproductive: Uterus is surgically absent. There is no adnexal mass. Other: No intraperitoneal free fluid. Musculoskeletal: Bone windows reveal no worrisome lytic or sclerotic osseous lesions. IMPRESSION: 1. Interval development of a new 4.7 cm rim enhancing fluid collection in the dome of the liver. Imaging features highly suspicious for intrahepatic abscess. A second adjacent smaller fluid collection is compatible with a satellite lesion. 2. 16 mm rim enhancing fluid collection seen in the cholecystectomy bed on the previous study has resolved in the interval. 3. Probable small gallstones again noted in the subhepatic space. 4. No evidence for biliary dilatation in this patient with common bile duct stent  in situ. I discussed these findings by telephone with Dr. Rubin Payor at approximate lead 1238 hours on 04/22/2015. Electronically Signed   By: Kennith Center M.D.   On: 04/22/2015 12:39   Ct Image Guided Drainage By Percutaneous Catheter  04/23/2015  CLINICAL DATA:  Development of new right lobe hepatic abscess in the superior right lobe. The patient presents for percutaneous drainage. EXAM: CT GUIDED DRAINAGE OF BODY PART ABSCESS ANESTHESIA/SEDATION: 2.0 Mg IV Versed 100 mcg IV Fentanyl Total Moderate Sedation Time:  40 minutes PROCEDURE: The procedure, risks, benefits, and alternatives were explained to the patient. Questions regarding the procedure were encouraged and answered. The patient understands and consents to the procedure. The right abdominal wall was prepped with Betadine in a sterile fashion, and a sterile drape was applied  covering the operative field. A sterile gown and sterile gloves were used for the procedure. Local anesthesia was provided with 1% Lidocaine. CT was performed in a supine position. Under CT guidance, an 18 gauge trocar needle was advanced into the right lobe of the liver. Aspiration of fluid was performed at the level of abscess. Fluid sample was sent for culture analysis. The percutaneous tract was dilated over a wire and a 10 French percutaneous drainage catheter placed. Catheter position was confirmed by CT. The catheter was flushed with saline and connected to a suction bulb. The catheter was secured at the skin with a Prolene retention suture and StatLock device. COMPLICATIONS: None FINDINGS: The dominant unilocular component of hepatic abscess near the dome was targeted. There is a separate smaller adjacent abscess. Aspiration at the level of the dominant abscess yielded brown, purulent fluid. After drain placement, the drain is well positioned in the dominant abscess cavity and there is good return of fluid. IMPRESSION: CT-guided percutaneous drainage performed of a right lobe hepatic abscess. A 10 French drain was placed and attached to suction bulb drainage. Electronically Signed   By: Irish Lack M.D.   On: 04/23/2015 14:16    Scheduled Meds: . cloNIDine  0.2 mg Oral BID  . escitalopram  10 mg Oral Daily  . feeding supplement (ENSURE ENLIVE)  237 mL Oral BID BM  . metoprolol  50 mg Oral TID  . pantoprazole  40 mg Oral Daily  . piperacillin-tazobactam (ZOSYN)  IV  3.375 g Intravenous 3 times per day  . potassium chloride  40 mEq Oral Once  . sodium chloride  3 mL Intravenous Q12H  . sucralfate  1 g Oral QID   Continuous Infusions:   Principal Problem:   Liver abscess Active Problems:   Abdominal pain    Time spent: 25 minutes     Erick Blinks, MD  [facility] Pager [phone]. If 7PM-7AM, please contact night-coverage at [email or web address], password [facility] 04/24/2015, 10:19  AM  LOS: 2 days     By signing my name below, I, Sheila [name], attest that this documentation has been prepared under the direction and in the presence of Erick Blinks, MD. Electronically signed: Zadie [name], Scribe. 04/24/2015 10:35am  I, Dr. Erick Blinks, personally performed the services described in this documentaiton. All medical record entries made by the scribe were at my direction and in my presence. I have reviewed the chart and agree that the record reflects my personal performance and is accurate and complete  Erick Blinks, MD, 04/24/2015 10:52 AM

## 2015-04-24 NOTE — Progress Notes (Signed)
B/P 100/50 manual and pulse 64, epaged hospitalist regarding clonidine and metoprolol ordered.  Waiing reply.

## 2015-04-24 NOTE — Progress Notes (Signed)
Initial Nutrition Assessment  DOCUMENTATION CODES:  Severe malnutrition in context of chronic illness   Pt meets criteria for SEVERE MALNUTRITION in the context of Chronic illness as evidenced by a loss of 18% bw in 3 months and an  oral intake that met < or equal to 75% of estiamted needs for > or equal to 1 month.  INTERVENTION:  Ensure Enlive po BID, each supplement provides 350 kcal and 20 grams of protein  NUTRITION DIAGNOSIS:  Inadequate oral intake related to poor appetite as evidenced by loss of 18% bw in 3 months.  GOAL:  Patient will meet greater than or equal to 90% of their needs  MONITOR:  PO intake, Supplement acceptance, Labs, Skin  REASON FOR ASSESSMENT:  Malnutrition Screening Tool    ASSESSMENT:  60 y/o female PMHx MI, Lupus, PUD, CVD, anxiety, HTN presents with subjective fever, chills, abd pain. CT ab/pelvis revealed liver abscess thought to be a complication of a cholecystectomy does in October 2016, now s/p JP drain placement.   Pt reports that since her surgery 3 months ago she has slowly lost her appetite. She used to eat 3 meals a day of a bland diet. However as her appetite decreased she started to drink Boost/Ensure (~2-3 a day). She also was taking a MVI. Her appetite continued to worsen and now she states she has not had anything to eat since last Saturday. She does reports drinking water during this past week though due to having a fever.   Pt reports her normal weight is 145-150 lbs. Going by her current weight, she has lost  26 lbs (~18% of bw)  Since her surgery.   As of now she does report that she is feeling better, though still does not a good appetite. She was agreeable to Ensure Enlive BID.   NFPE: Limited, but appears WDL  Diet Order:  Diet Heart Room service appropriate?: Yes; Fluid consistency:: Thin  Skin:  Dry, JP drain R side  Last BM:  12/30  Height:  Ht Readings from Last 1 Encounters:  04/22/15 5' (1.524 m)   Weight:  Wt  Readings from Last 1 Encounters:  04/22/15 119 lb 14.9 oz (54.4 kg)   Wt Readings from Last 10 Encounters:  04/22/15 119 lb 14.9 oz (54.4 kg)  03/10/15 121 lb (54.885 kg)  03/08/15 123 lb 3.2 oz (55.883 kg)  03/07/15 122 lb 12.8 oz (55.702 kg)  02/22/15 137 lb 5.6 oz (62.3 kg)  02/08/15 141 lb (63.957 kg)  02/07/15 148 lb (67.132 kg)  02/07/15 141 lb 3.2 oz (64.048 kg)  01/25/15 146 lb 2.6 oz (66.3 kg)  01/22/15 145 lb (65.772 kg)   Ideal Body Weight:  45.45 kg  BMI:  Body mass index is 23.42 kg/(m^2).  Estimated Nutritional Needs:  Kcal:  1450-1650 (27-30 kcal/kg bw) Protein:  54-65 g (1-1.2 g/kg bw) Fluid:  1.5-1.7 liters fluid  EDUCATION NEEDS:  No education needs identified at this time  Burtis Junes RD, LDN Nutrition Pager: [phone] 04/24/2015 3:21 PM

## 2015-04-25 DIAGNOSIS — I10 Essential (primary) hypertension: Secondary | ICD-10-CM

## 2015-04-25 DIAGNOSIS — E876 Hypokalemia: Secondary | ICD-10-CM

## 2015-04-25 DIAGNOSIS — N179 Acute kidney failure, unspecified: Secondary | ICD-10-CM

## 2015-04-25 LAB — BASIC METABOLIC PANEL
Anion gap: 9 (ref 5–15)
BUN: 18 mg/dL (ref 6–20)
CALCIUM: 8 mg/dL — AB (ref 8.9–10.3)
CO2: 23 mmol/L (ref 22–32)
Chloride: 107 mmol/L (ref 101–111)
Creatinine, Ser: 1.42 mg/dL — ABNORMAL HIGH (ref 0.44–1.00)
GFR calc Af Amer: 45 mL/min — ABNORMAL LOW (ref >60)
GFR, EST NON AFRICAN AMERICAN: 39 mL/min — AB (ref >60)
GLUCOSE: 104 mg/dL — AB (ref 65–99)
POTASSIUM: 3 mmol/L — AB (ref 3.5–5.1)
Sodium: 139 mmol/L (ref 135–145)

## 2015-04-25 LAB — CBC
HEMATOCRIT: 26.1 % — AB (ref 36.0–46.0)
Hemoglobin: 8.6 g/dL — ABNORMAL LOW (ref 12.0–15.0)
MCH: 29 pg (ref 26.0–34.0)
MCHC: 33 g/dL (ref 30.0–36.0)
MCV: 87.9 fL (ref 78.0–100.0)
PLATELETS: 301 10*3/uL (ref 150–400)
RBC: 2.97 MIL/uL — ABNORMAL LOW (ref 3.87–5.11)
RDW: 15.7 % — AB (ref 11.5–15.5)
WBC: 19.7 10*3/uL — ABNORMAL HIGH (ref 4.0–10.5)

## 2015-04-25 LAB — CULTURE, ROUTINE-ABSCESS

## 2015-04-25 MED ORDER — DEXTROSE 5 % IV SOLN
2.0000 g | INTRAVENOUS | Status: DC
  Administered 2015-04-25 – 2015-04-28 (×4): 2 g via INTRAVENOUS
  Filled 2015-04-25 (×6): qty 2

## 2015-04-25 NOTE — Progress Notes (Signed)
[facility] PROGRESS NOTE  Sheila [name] MWU:[id number] DOB: 1956-01-27 DOA: 04/22/2015 PCP: [name], MD Summary  38 yof with PMH of cholecystectomy 01/2015 with postop bile leak present with complaints of subjective fever and chills and abdominal pain. CT A/P revealed liver abscess which was felt to be late complication of cholecystectomy and subsequent bile leak. She was transported to [facility] on 1/1 for drain placement. Case discussed with Dr. Drue Second who recommended changing antibiotics to rocephin 2gms daily until 1/24. Since WBC is trending up, will continue to observe Sheila [name] for now. She will need PICC line placement once creatinine has further recovered.   Assessment/Plan: 1. Intrahepatic abscess, likely delayed complication of cholecystectomy, bile leak and drain placement. WBC remains elevated. Clinically she does not appear any worse today. Started on empiric abx with zosyn. Cultures show Klebsiella oxytoca. Discussed case with ID, Dr. Drue Second who recommends another 3 weeks of IV rocephin and follow up with infectious disease clinic. She will also need follow up with IR clinic in 3 weeks for re imaging and drain removal. Since her leukocytosis is trending up, we will continue to observe her for now. Will check differential on CBC.  2. Hypokalemia, slowly improving. Will continue to replete. Magnesium noted to be in normal range.  3. Acute kidney injury, multifactorial, possibly related to fluctuation of blood pressure, IV contrast received 12/31 as well as one dose of Toradol received 1/1. Will continue IV hydration and follow urine output. 4. PMH Multiple large gastric ulcers 02/2015, thought secondary to NSAID use. Continue PPI and sucralfate.  5. PMH CAD, s/p stent placement. No complaints of chest pain at present 6. SLE. No complaints of joint pain or rash 7. S/p cholecystectomy 01/2015. Study negative for bile leak 02/2015.   Code Status: Full DVT prophylaxis: SCDs Family  Communication: Discussed with Sheila [name] who understands and has no concerns at this time.  Disposition Plan: discharge home once improved   Consultants:  Interventional radiology  Procedures:  CT guided intrahepatic drain placement 04/23/15  Antibiotics:  Zosyn 12/31>>1/3  Rocephin 1/3>>  HPI/Subjective:  Still has abdominal pain. Urinating without difficulty. Has not eaten much, or had any bowel movements.   Objective: Filed Vitals:   04/24/15 2225 04/25/15 0642  BP: 100/50 136/67  Pulse: 64 88  Temp: 98.7 F (37.1 C) 98.8 F (37.1 C)  Resp: 15 17    Intake/Output Summary (Last 24 hours) at 04/25/15 1012 Last data filed at 04/25/15 0938  Gross per 24 hour  Intake    150 ml  Output    690 ml  Net   -540 ml   Filed Weights   04/22/15 0855 04/22/15 1556  Weight: 56.7 kg (125 lb) 54.4 kg (119 lb 14.9 oz)    Exam: General: NAD.  Cardiovascular: RRR. No m/r/g   Respiratory: clear bilaterally, No wheezing, rales or rhonchi Abdomen:  Drain present in RUQ. Mild diffused tenderness in abdomen. Soft  Musculoskeletal: No edema b/l  Data Reviewed: Basic Metabolic Panel:  Recent Labs Lab 04/22/15 0929 04/23/15 7253 04/23/15 0836 04/23/15 1922 04/24/15 0546 04/25/15 0555  NA 137 138  --   --  137 139  K 2.8* 2.4*  --  2.8* 2.9* 3.0*  CL 98* 100*  --   --  101 107  CO2 26 28  --   --  24 23  GLUCOSE 118* 134*  --   --  149* 104*  BUN 8 <5*  --   --  12 18  CREATININE 0.58 0.53  --   --  1.93* 1.42*  CALCIUM 9.3 8.5*  --   --  8.2* 8.0*  MG  --   --  1.8  --   --   --    Liver Function Tests:  Recent Labs Lab 04/22/15 0929 04/23/15 0638  AST 55* 31  ALT 48 35  ALKPHOS 137* 106  BILITOT 0.8 0.7  PROT 8.3* 6.4*  ALBUMIN 3.4* 2.5*    Recent Labs Lab 04/22/15 0929  LIPASE 16   CBC:  Recent Labs Lab 04/22/15 0929 04/23/15 0638 04/24/15 0546 04/25/15 0555  WBC 15.3* 11.4* 17.6* 19.7*  NEUTROABS 13.1*  --   --   --   HGB 11.0* 9.0* 9.4* 8.6*   HCT 33.9* 28.2* 29.4* 26.1*  MCV 89.2 88.7 89.6 87.9  PLT 517* 495* 242 301     Recent Results (from the past 240 hour(s))  Blood culture (routine x 2)     Status: None (Preliminary result)   Collection Time: 04/22/15  1:13 PM  Result Value Ref Range Status   Specimen Description BLOOD RIGHT WRIST  Final   Special Requests BOTTLES DRAWN AEROBIC AND ANAEROBIC 6CC  Final   Culture NO GROWTH 2 DAYS  Final   Report Status PENDING  Incomplete  Blood culture (routine x 2)     Status: None (Preliminary result)   Collection Time: 04/22/15  1:19 PM  Result Value Ref Range Status   Specimen Description BLOOD LEFT ARM  Final   Special Requests BOTTLES DRAWN AEROBIC AND ANAEROBIC 6CC  Final   Culture NO GROWTH 2 DAYS  Final   Report Status PENDING  Incomplete  Culture, routine-abscess     Status: None   Collection Time: 04/23/15  4:17 PM  Result Value Ref Range Status   Specimen Description ASPIRATE HEPATIC ABSCESS  Final   Special Requests NONE  Final   Gram Stain   Final    RARE WBC PRESENT, PREDOMINANTLY PMN NO SQUAMOUS EPITHELIAL CELLS SEEN MODERATE GRAM VARIABLE ROD Performed at [facility]    Culture   Final    ABUNDANT KLEBSIELLA OXYTOCA Performed at [facility]    Report Status 04/25/2015 FINAL  Final   Organism ID, Bacteria KLEBSIELLA OXYTOCA  Final      Susceptibility   Klebsiella oxytoca - MIC*    AMPICILLIN >=32 RESISTANT Resistant     AMPICILLIN/SULBACTAM 16 INTERMEDIATE Intermediate     CEFAZOLIN 8 RESISTANT Resistant     CEFEPIME <=1 SENSITIVE Sensitive     CEFTAZIDIME <=1 SENSITIVE Sensitive     CEFTRIAXONE <=1 SENSITIVE Sensitive     CIPROFLOXACIN <=0.25 SENSITIVE Sensitive     GENTAMICIN <=1 SENSITIVE Sensitive     IMIPENEM <=0.25 SENSITIVE Sensitive     PIP/TAZO <=4 SENSITIVE Sensitive     TOBRAMYCIN <=1 SENSITIVE Sensitive     TRIMETH/SULFA Value in next row Sensitive      <=20 SENSITIVE(NOTE)    * ABUNDANT KLEBSIELLA OXYTOCA   Anaerobic culture     Status: None (Preliminary result)   Collection Time: 04/23/15  4:18 PM  Result Value Ref Range Status   Specimen Description ASPIRATE HEPATIC ABSCESS  Final   Special Requests NONE  Final   Gram Stain   Final    RARE WBC PRESENT, PREDOMINANTLY PMN NO SQUAMOUS EPITHELIAL CELLS SEEN MODERATE GRAM VARIABLE ROD Performed at [facility]    Culture PENDING  Incomplete   Report Status PENDING  Incomplete  Studies: Ct Image Guided Drainage By Percutaneous Catheter  04/23/2015  CLINICAL DATA:  Development of new right lobe hepatic abscess in the superior right lobe. The Sheila [name] presents for percutaneous drainage. EXAM: CT GUIDED DRAINAGE OF BODY PART ABSCESS ANESTHESIA/SEDATION: 2.0 Mg IV Versed 100 mcg IV Fentanyl Total Moderate Sedation Time:  40 minutes PROCEDURE: The procedure, risks, benefits, and alternatives were explained to the Sheila [name]. Questions regarding the procedure were encouraged and answered. The Sheila [name] understands and consents to the procedure. The right abdominal wall was prepped with Betadine in a sterile fashion, and a sterile drape was applied covering the operative field. A sterile gown and sterile gloves were used for the procedure. Local anesthesia was provided with 1% Lidocaine. CT was performed in a supine position. Under CT guidance, an 18 gauge trocar needle was advanced into the right lobe of the liver. Aspiration of fluid was performed at the level of abscess. Fluid sample was sent for culture analysis. The percutaneous tract was dilated over a wire and a 10 French percutaneous drainage catheter placed. Catheter position was confirmed by CT. The catheter was flushed with saline and connected to a suction bulb. The catheter was secured at the skin with a Prolene retention suture and StatLock device. COMPLICATIONS: None FINDINGS: The dominant unilocular component of hepatic abscess near the dome was targeted. There is a separate smaller adjacent  abscess. Aspiration at the level of the dominant abscess yielded brown, purulent fluid. After drain placement, the drain is well positioned in the dominant abscess cavity and there is good return of fluid. IMPRESSION: CT-guided percutaneous drainage performed of a right lobe hepatic abscess. A 10 French drain was placed and attached to suction bulb drainage. Electronically Signed   By: Irish Lack M.D.   On: 04/23/2015 14:16    Scheduled Meds: . cloNIDine  0.2 mg Oral BID  . escitalopram  10 mg Oral Daily  . feeding supplement (ENSURE ENLIVE)  237 mL Oral BID BM  . metoprolol  50 mg Oral TID  . pantoprazole  40 mg Oral Daily  . piperacillin-tazobactam (ZOSYN)  IV  3.375 g Intravenous 3 times per day  . potassium chloride  40 mEq Oral Once  . sodium chloride  3 mL Intravenous Q12H  . sucralfate  1 g Oral QID   Continuous Infusions: . sodium chloride 1 mL (04/24/15 2051)    Principal Problem:   Liver abscess Active Problems:   HTN (hypertension)   Acute kidney injury (HCC)   Hypokalemia   Abdominal pain    Time spent: 25 minutes     Erick Blinks, MD  [facility] Pager [phone]. If 7PM-7AM, please contact night-coverage at [email or web address], password [facility] 04/25/2015, 10:12 AM  LOS: 3 days     By signing my name below, I, Zadie Cleverly, attest that this documentation has been prepared under the direction and in the presence of Erick Blinks, MD. Electronically signed: Zadie Cleverly, Scribe. 04/25/2015 10:12am   I, Dr. Erick Blinks, personally performed the services described in this documentaiton. All medical record entries made by the scribe were at my direction and in my presence. I have reviewed the chart and agree that the record reflects my personal performance and is accurate and complete  Erick Blinks, MD, 04/25/2015 3:21 PM

## 2015-04-25 NOTE — Progress Notes (Signed)
JP drain irrigated with 10ml saline, 10 ml returned in saringe, bulb to suction, will continue to monitor.

## 2015-04-26 DIAGNOSIS — I10 Essential (primary) hypertension: Secondary | ICD-10-CM | POA: Diagnosis present

## 2015-04-26 LAB — BASIC METABOLIC PANEL
ANION GAP: 9 (ref 5–15)
BUN: 13 mg/dL (ref 6–20)
CALCIUM: 8.2 mg/dL — AB (ref 8.9–10.3)
CO2: 22 mmol/L (ref 22–32)
Chloride: 108 mmol/L (ref 101–111)
Creatinine, Ser: 1.01 mg/dL — ABNORMAL HIGH (ref 0.44–1.00)
GFR, EST NON AFRICAN AMERICAN: 59 mL/min — AB (ref >60)
GLUCOSE: 113 mg/dL — AB (ref 65–99)
Potassium: 3 mmol/L — ABNORMAL LOW (ref 3.5–5.1)
Sodium: 139 mmol/L (ref 135–145)

## 2015-04-26 LAB — CBC WITH DIFFERENTIAL/PLATELET
BASOS PCT: 0 %
Basophils Absolute: 0 10*3/uL (ref 0.0–0.1)
Eosinophils Absolute: 0.1 10*3/uL (ref 0.0–0.7)
Eosinophils Relative: 1 %
HEMATOCRIT: 25.6 % — AB (ref 36.0–46.0)
HEMOGLOBIN: 8.3 g/dL — AB (ref 12.0–15.0)
Lymphocytes Relative: 10 %
Lymphs Abs: 2.1 10*3/uL (ref 0.7–4.0)
MCH: 28.3 pg (ref 26.0–34.0)
MCHC: 32.4 g/dL (ref 30.0–36.0)
MCV: 87.4 fL (ref 78.0–100.0)
MONO ABS: 1.1 10*3/uL — AB (ref 0.1–1.0)
Monocytes Relative: 5 %
NEUTROS ABS: 17.9 10*3/uL — AB (ref 1.7–7.7)
NEUTROS PCT: 84 %
Platelets: 376 10*3/uL (ref 150–400)
RBC: 2.93 MIL/uL — ABNORMAL LOW (ref 3.87–5.11)
RDW: 15.8 % — AB (ref 11.5–15.5)
WBC: 21.2 10*3/uL — ABNORMAL HIGH (ref 4.0–10.5)

## 2015-04-26 MED ORDER — POTASSIUM CHLORIDE CRYS ER 20 MEQ PO TBCR
20.0000 meq | EXTENDED_RELEASE_TABLET | Freq: Two times a day (BID) | ORAL | Status: AC
  Administered 2015-04-26 (×2): 20 meq via ORAL
  Filled 2015-04-26 (×2): qty 1

## 2015-04-26 MED ORDER — POTASSIUM CHLORIDE IN NACL 40-0.9 MEQ/L-% IV SOLN
INTRAVENOUS | Status: DC
  Administered 2015-04-26: 100 mL/h via INTRAVENOUS
  Administered 2015-04-27: 75 mL/h via INTRAVENOUS

## 2015-04-26 NOTE — Care Management Note (Signed)
Case Management Note  Patient Details  Name: Sheila [name] MRN: [id number] Date of Birth: 21-Oct-1955  Subjective/Objective:                    Action/Plan:   Expected Discharge Date:  04/26/15               Expected Discharge Plan:  Home w Home Health Services  In-House Referral:  NA  Discharge planning Services  CM Consult  Post Acute Care Choice:  Home Health Choice offered to:  Patient  DME Arranged:    DME Agency:     HH Arranged:  RN, IV Antibiotics HH Agency:  [facility]  Status of Service:  In process, will continue to follow  Medicare Important Message Given:    Date Medicare IM Given:    Medicare IM give by:    Date Additional Medicare IM Given:    Additional Medicare Important Message give by:     If discussed at Long Length of Stay Meetings, dates discussed:    Additional Comments: CM spoke with pt about need for IV AB at discharge. [facility]. [facility] Pt stated she could learn how to do her IV AB. Alroy BailiffLinda Lothian of [facility] is aware and will collect the pts information from the chart. Pt will need PICC line prior to discharge. Will continue to follow for discharge planning needs. Arlyss QueenBlackwell, Analyn Matusek Mesicrowder, RN 04/26/2015, 1:49 PM

## 2015-04-26 NOTE — Progress Notes (Addendum)
[facility] PROGRESS NOTE  Sheila [name] JYN:[id number] DOB: 08-20-1955 DOA: 04/22/2015 PCP: Geraldo PitterBLAND,VEITA J, MD Summary  [age] yof with PMH of cholecystectomy 01/2015 with postop bile leak presented with complaints of subjective fever and chills and abdominal pain. CT A/P revealed liver abscess which was felt to be a late complication of cholecystectomy and subsequent bile leak. [facility]. Dr. Kerry HoughMemon discussed the case with Dr. Drue SecondSnider who recommended changing antibiotics to rocephin 2gms daily until 1/24. She will need PICC line placement once creatinine has further recovered.   Assessment/Plan: Intrahepatic abscess, likely delayed complication of cholecystectomy, bile leak and drain placement. WBC remains elevated. Clinically she does not appear any worse today. Started on empiric abx with zosyn. Cultures show Klebsiella oxytoca. Discussed case with ID, Dr. Drue SecondSnider who recommends another 3 weeks of IV rocephin and follow up with infectious disease clinic. She will also need follow up with IR clinic in 3 weeks for re imaging and drain removal.  -Her WBC continues to trend up, but she does not appear to have worsening abdominal pain. The drain appears to be draining properly per nursing.  -We'll continue to monitor further in the hospital and consider reimaging if her white blood cell count continues to trend up significantly.   Hypokalemia. Patient's serum potassium is still low. Magnesium noted to be in normal range.  -We'll add potassium to the maintenance IV fluids and continue oral potassium chloride as tolerated.  Essential hypertension. We'll continue metoprolol twice a day. Her blood pressure is trending up, possibly secondary to IV fluids. Will decrease IV fluids to 75 cc an hour.  Acute kidney injury, multifactorial, possibly related to fluctuation of blood pressure, IV contrast received 12/31 as well as one dose of Toradol received 1/1. -Her  creatinine is trending toward normal and is 1.01 today. -We will decrease IV fluids a little and continue to follow.  History of multiple large gastric ulcers 02/2015, thought secondary to NSAID use. Continue PPI and sucralfate.   History of CAD, s/p stent placement. No complaints of chest pain at present  SLE. No complaints of joint pain or rash  S/p cholecystectomy 01/2015. Study negative for bile leak 02/2015.   Code Status: Full DVT prophylaxis: SCDs Family Communication: Discussed with patient, family not available.  Disposition Plan: discharge home once improved   Consultants:  Interventional radiology  Procedures:  CT guided intrahepatic drain placement 04/23/15  Antibiotics:  Zosyn 12/31>>1/3  Rocephin 1/3>>  HPI/Subjective:  Patient complains of left-sided abdominal pain, but less than yesterday. She had a small bowel movement overnight. She denies chest pain or chest congestion.   Objective: Filed Vitals:   04/26/15 0546 04/26/15 1500  BP: 160/88 154/80  Pulse: 78 82  Temp: 97.8 F (36.6 C) 97.9 F (36.6 C)  Resp: 17 18   oxygen saturation 95% on room air.  Intake/Output Summary (Last 24 hours) at 04/26/15 1841 Last data filed at 04/26/15 1800  Gross per 24 hour  Intake 6662.92 ml  Output    700 ml  Net 5962.92 ml   Filed Weights   04/22/15 0855 04/22/15 1556  Weight: 56.7 kg (125 lb) 54.4 kg (119 lb 14.9 oz)    Exam: General: 60 year old African-American woman in no acute distress.  Cardiovascular: S1, S2, with soft systolic murmur.  Respiratory: Clear to auscultation bilaterally with decreased breath sounds in the bases. Abdomen:  Drain present in RUQ with minimal serous drainage in the bulb; positive bowel  sounds; mild right upper quadrant tenderness without distention. Musculoskeletal/extremities: No pedal edema.  Data Reviewed: Basic Metabolic Panel:  Recent Labs Lab 04/22/15 0929 04/23/15 1610 04/23/15 0836 04/23/15 1922  04/24/15 0546 04/25/15 0555 04/26/15 0557  NA 137 138  --   --  137 139 139  K 2.8* 2.4*  --  2.8* 2.9* 3.0* 3.0*  CL 98* 100*  --   --  101 107 108  CO2 26 28  --   --  24 23 22   GLUCOSE 118* 134*  --   --  149* 104* 113*  BUN 8 <5*  --   --  12 18 13   CREATININE 0.58 0.53  --   --  1.93* 1.42* 1.01*  CALCIUM 9.3 8.5*  --   --  8.2* 8.0* 8.2*  MG  --   --  1.8  --   --   --   --    Liver Function Tests:  Recent Labs Lab 04/22/15 0929 04/23/15 0638  AST 55* 31  ALT 48 35  ALKPHOS 137* 106  BILITOT 0.8 0.7  PROT 8.3* 6.4*  ALBUMIN 3.4* 2.5*    Recent Labs Lab 04/22/15 0929  LIPASE 16   CBC:  Recent Labs Lab 04/22/15 0929 04/23/15 0638 04/24/15 0546 04/25/15 0555 04/26/15 0557  WBC 15.3* 11.4* 17.6* 19.7* 21.2*  NEUTROABS 13.1*  --   --   --  17.9*  HGB 11.0* 9.0* 9.4* 8.6* 8.3*  HCT 33.9* 28.2* 29.4* 26.1* 25.6*  MCV 89.2 88.7 89.6 87.9 87.4  PLT 517* 495* 242 301 376     Recent Results (from the past 240 hour(s))  Blood culture (routine x 2)     Status: None (Preliminary result)   Collection Time: 04/22/15  1:13 PM  Result Value Ref Range Status   Specimen Description BLOOD RIGHT WRIST  Final   Special Requests BOTTLES DRAWN AEROBIC AND ANAEROBIC 6CC  Final   Culture NO GROWTH 4 DAYS  Final   Report Status PENDING  Incomplete  Blood culture (routine x 2)     Status: None (Preliminary result)   Collection Time: 04/22/15  1:19 PM  Result Value Ref Range Status   Specimen Description BLOOD LEFT ARM  Final   Special Requests BOTTLES DRAWN AEROBIC AND ANAEROBIC 6CC  Final   Culture NO GROWTH 4 DAYS  Final   Report Status PENDING  Incomplete  Culture, routine-abscess     Status: None   Collection Time: 04/23/15  4:17 PM  Result Value Ref Range Status   Specimen Description ASPIRATE HEPATIC ABSCESS  Final   Special Requests NONE  Final   Gram Stain   Final    RARE WBC PRESENT, PREDOMINANTLY PMN NO SQUAMOUS EPITHELIAL CELLS SEEN MODERATE GRAM VARIABLE  ROD Performed at [facility]    Culture   Final    ABUNDANT KLEBSIELLA OXYTOCA Performed at [facility]    Report Status 04/25/2015 FINAL  Final   Organism ID, Bacteria KLEBSIELLA OXYTOCA  Final      Susceptibility   Klebsiella oxytoca - MIC*    AMPICILLIN >=32 RESISTANT Resistant     AMPICILLIN/SULBACTAM 16 INTERMEDIATE Intermediate     CEFAZOLIN 8 RESISTANT Resistant     CEFEPIME <=1 SENSITIVE Sensitive     CEFTAZIDIME <=1 SENSITIVE Sensitive     CEFTRIAXONE <=1 SENSITIVE Sensitive     CIPROFLOXACIN <=0.25 SENSITIVE Sensitive     GENTAMICIN <=1 SENSITIVE Sensitive     IMIPENEM <=0.25  SENSITIVE Sensitive     PIP/TAZO <=4 SENSITIVE Sensitive     TOBRAMYCIN <=1 SENSITIVE Sensitive     TRIMETH/SULFA Value in next row Sensitive      <=20 SENSITIVE(NOTE)    * ABUNDANT KLEBSIELLA OXYTOCA  Anaerobic culture     Status: None (Preliminary result)   Collection Time: 04/23/15  4:18 PM  Result Value Ref Range Status   Specimen Description ASPIRATE HEPATIC ABSCESS  Final   Special Requests NONE  Final   Gram Stain   Final    RARE WBC PRESENT, PREDOMINANTLY PMN NO SQUAMOUS EPITHELIAL CELLS SEEN MODERATE GRAM VARIABLE ROD Performed at [facility]    Culture PENDING  Incomplete   Report Status PENDING  Incomplete     Studies: No results found.  Scheduled Meds: . cefTRIAXone (ROCEPHIN)  IV  2 g Intravenous Q24H  . cloNIDine  0.2 mg Oral BID  . escitalopram  10 mg Oral Daily  . feeding supplement (ENSURE ENLIVE)  237 mL Oral BID BM  . metoprolol  50 mg Oral TID  . pantoprazole  40 mg Oral Daily  . potassium chloride  20 mEq Oral BID  . potassium chloride  40 mEq Oral Once  . sodium chloride  3 mL Intravenous Q12H  . sucralfate  1 g Oral QID   Continuous Infusions: . 0.9 % NaCl with KCl 40 mEq / L 100 mL/hr (04/26/15 1625)    Principal Problem:   Liver abscess Active Problems:   HTN (hypertension)   Acute kidney injury (HCC)   Hypokalemia    Abdominal pain    Time spent: 25 minutes     Elliot Cousin, MD  [facility] Pager [phone]. If 7PM-7AM, please contact night-coverage at [email or web address], password [facility] 04/26/2015, 6:41 PM  LOS: 4 days

## 2015-04-27 LAB — BASIC METABOLIC PANEL
Anion gap: 8 (ref 5–15)
BUN: 8 mg/dL (ref 6–20)
CALCIUM: 8.5 mg/dL — AB (ref 8.9–10.3)
CO2: 20 mmol/L — AB (ref 22–32)
CREATININE: 0.78 mg/dL (ref 0.44–1.00)
Chloride: 112 mmol/L — ABNORMAL HIGH (ref 101–111)
GFR calc non Af Amer: >60 mL/min (ref >60)
Glucose, Bld: 101 mg/dL — ABNORMAL HIGH (ref 65–99)
Potassium: 3.5 mmol/L (ref 3.5–5.1)
SODIUM: 140 mmol/L (ref 135–145)

## 2015-04-27 LAB — CULTURE, BLOOD (ROUTINE X 2)
Culture: NO GROWTH
Culture: NO GROWTH

## 2015-04-27 LAB — CBC
HEMATOCRIT: 24.6 % — AB (ref 36.0–46.0)
Hemoglobin: 8.3 g/dL — ABNORMAL LOW (ref 12.0–15.0)
MCH: 29.3 pg (ref 26.0–34.0)
MCHC: 33.7 g/dL (ref 30.0–36.0)
MCV: 86.9 fL (ref 78.0–100.0)
Platelets: 414 10*3/uL — ABNORMAL HIGH (ref 150–400)
RBC: 2.83 MIL/uL — ABNORMAL LOW (ref 3.87–5.11)
RDW: 16 % — AB (ref 11.5–15.5)
WBC: 10.6 10*3/uL — ABNORMAL HIGH (ref 4.0–10.5)

## 2015-04-27 MED ORDER — HYDRALAZINE HCL 20 MG/ML IJ SOLN
5.0000 mg | INTRAMUSCULAR | Status: DC | PRN
  Administered 2015-04-27 – 2015-04-28 (×2): 5 mg via INTRAVENOUS
  Filled 2015-04-27 (×2): qty 1

## 2015-04-27 MED ORDER — AMLODIPINE BESYLATE 5 MG PO TABS
10.0000 mg | ORAL_TABLET | Freq: Every day | ORAL | Status: DC
  Administered 2015-04-27 – 2015-04-28 (×2): 10 mg via ORAL
  Filled 2015-04-27 (×2): qty 2

## 2015-04-27 MED ORDER — POTASSIUM CHLORIDE CRYS ER 20 MEQ PO TBCR
20.0000 meq | EXTENDED_RELEASE_TABLET | Freq: Two times a day (BID) | ORAL | Status: DC
  Administered 2015-04-27 – 2015-04-28 (×2): 20 meq via ORAL
  Filled 2015-04-27 (×2): qty 1

## 2015-04-27 NOTE — Progress Notes (Signed)
Patient's B/p 193/86,Dr Fisher notified..Will continue to monitor patient.

## 2015-04-27 NOTE — Progress Notes (Signed)
[facility] PROGRESS NOTE  Sheila [name] ZOX:[id number] DOB: 07-Jan-1956 DOA: 04/22/2015 PCP: [name], MD Summary  60 yof with PMH of cholecystectomy 01/2015 with postop bile leak presented with complaints of subjective fever and chills and abdominal pain. CT A/P revealed liver abscess which was felt to be a late complication of cholecystectomy and subsequent bile leak. She was transported to [facility] on 1/1 for drain placement. Dr. Kerry Hough discussed the case with Dr. Drue Second who recommended changing antibiotics to rocephin 2gms daily until 1/24.    Assessment/Plan: Intrahepatic abscess, likely delayed complication of cholecystectomy, bile leak and drain placement. WBC remains elevated. Clinically she does not appear any worse today. Started on empiric abx with zosyn. Cultures show Klebsiella oxytoca. Discussed case with ID, Dr. Drue Second who recommends another 3 weeks of IV rocephin and follow up with infectious disease clinic. She will also need follow up with IR clinic in 3 weeks for re imaging and drain removal.  -Her WBC had been trending up, but now it is trending down. The drain appears to be draining properly per nursing.  -We'll order a PICC line and anticipate discharge on 04/27/14.  Hypokalemia. Patient's serum potassium is still low. Magnesium noted to be in normal range.  -Potassium was added to the maintenance IV fluids. Her serum potassium has improved.  Essential hypertension. Patient was restarted on her chronic antihypertensive medications, metoprolol and clonidine. However, amlodipine was not restarted. Her blood pressure has been trending up, so IV fluids were decreased. Amlodipine was restarted. We'll continue to monitor.   Acute kidney injury, multifactorial, possibly related to fluctuation of blood pressure, IV contrast received 12/31 as well as one dose of Toradol received 1/1. -Her creatinine has improved progressively and it is now within normal limits. -We'll discontinue  IV fluids.  History of multiple large gastric ulcers 02/2015, thought secondary to NSAID use. Continue PPI and sucralfate.   History of CAD, s/p stent placement. No complaints of chest pain at present.  SLE. No complaints of joint pain or rash  S/p cholecystectomy 01/2015. Study negative for bile leak 02/2015.   Code Status: Full DVT prophylaxis: SCDs Family Communication: Discussed with patient, family not available.  Disposition Plan: discharge home once improved   Consultants:  Interventional radiology  Procedures:  PICC line, pending.  CT guided intrahepatic drain placement 04/23/15  Antibiotics:  Zosyn 12/31>>1/3  Rocephin 1/3>>  HPI/Subjective:  Patient has mild right-sided abdominal pain, no worst in usual and is tolerable. She denies nausea vomiting.   Objective: Filed Vitals:   04/27/15 1047 04/27/15 1424  BP: 193/85 176/78  Pulse: 86 74  Temp:  97.7 F (36.5 C)  Resp:  16   oxygen saturation 98% on room air.  Intake/Output Summary (Last 24 hours) at 04/27/15 1602 Last data filed at 04/27/15 1300  Gross per 24 hour  Intake    250 ml  Output     10 ml  Net    240 ml   Filed Weights   04/22/15 0855 04/22/15 1556  Weight: 56.7 kg (125 lb) 54.4 kg (119 lb 14.9 oz)    Exam: General: 60 year old African-American woman in no acute distress.  Cardiovascular: S1, S2, with soft systolic murmur.  Respiratory: Clear to auscultation bilaterally with decreased breath sounds in the bases. Abdomen:  Drain present in RUQ with minimal serous drainage in the bulb; positive bowel sounds; mild right upper quadrant tenderness without distention. Musculoskeletal/extremities: No pedal edema.  Data Reviewed: Basic Metabolic Panel:  Recent Labs Lab 04/23/15  [id number] 04/23/15 [id number] 04/23/15 1922 04/24/15 0546 04/25/15 0555 04/26/15 0557 04/27/15 0600  NA 138  --   --  137 139 139 140  K 2.4*  --  2.8* 2.9* 3.0* 3.0* 3.5  CL 100*  --   --  101 107 108 112*  CO2  28  --   --  24 23 22  20*  GLUCOSE 134*  --   --  149* 104* 113* 101*  BUN <5*  --   --  12 18 13 8   CREATININE 0.53  --   --  1.93* 1.42* 1.01* 0.78  CALCIUM 8.5*  --   --  8.2* 8.0* 8.2* 8.5*  MG  --  1.8  --   --   --   --   --    Liver Function Tests:  Recent Labs Lab 04/22/15 0929 04/23/15 0638  AST 55* 31  ALT 48 35  ALKPHOS 137* 106  BILITOT 0.8 0.7  PROT 8.3* 6.4*  ALBUMIN 3.4* 2.5*    Recent Labs Lab 04/22/15 0929  LIPASE 16   CBC:  Recent Labs Lab 04/22/15 0929 04/23/15 0638 04/24/15 0546 04/25/15 0555 04/26/15 0557 04/27/15 0600  WBC 15.3* 11.4* 17.6* 19.7* 21.2* 10.6*  NEUTROABS 13.1*  --   --   --  17.9*  --   HGB 11.0* 9.0* 9.4* 8.6* 8.3* 8.3*  HCT 33.9* 28.2* 29.4* 26.1* 25.6* 24.6*  MCV 89.2 88.7 89.6 87.9 87.4 86.9  PLT 517* 495* 242 301 376 414*     Recent Results (from the past 240 hour(s))  Blood culture (routine x 2)     Status: None   Collection Time: 04/22/15  1:13 PM  Result Value Ref Range Status   Specimen Description BLOOD RIGHT WRIST  Final   Special Requests BOTTLES DRAWN AEROBIC AND ANAEROBIC 6CC  Final   Culture NO GROWTH 5 DAYS  Final   Report Status 04/27/2015 FINAL  Final  Blood culture (routine x 2)     Status: None   Collection Time: 04/22/15  1:19 PM  Result Value Ref Range Status   Specimen Description BLOOD LEFT ARM  Final   Special Requests BOTTLES DRAWN AEROBIC AND ANAEROBIC 6CC  Final   Culture NO GROWTH 5 DAYS  Final   Report Status 04/27/2015 FINAL  Final  Culture, routine-abscess     Status: None   Collection Time: 04/23/15  4:17 PM  Result Value Ref Range Status   Specimen Description ASPIRATE HEPATIC ABSCESS  Final   Special Requests NONE  Final   Gram Stain   Final    RARE WBC PRESENT, PREDOMINANTLY PMN NO SQUAMOUS EPITHELIAL CELLS SEEN MODERATE GRAM VARIABLE ROD [facility]formed [facility]    Culture   Final    ABUNDANT KLEBSIELLA OXYTOCA [facility]formed at [facility]    Report Status  04/25/2015 FINAL  Final   Organism ID, Bacteria KLEBSIELLA OXYTOCA  Final      Susceptibility   Klebsiella oxytoca - MIC*    AMPICILLIN >=32 RESISTANT Resistant     AMPICILLIN/SULBACTAM 16 INTERMEDIATE Intermediate     CEFAZOLIN 8 RESISTANT Resistant     CEFEPIME <=1 SENSITIVE Sensitive     CEFTAZIDIME <=1 SENSITIVE Sensitive     CEFTRIAXONE <=1 SENSITIVE Sensitive     CIPROFLOXACIN <=0.25 SENSITIVE Sensitive     GENTAMICIN <=1 SENSITIVE Sensitive     IMIPENEM <=0.25 SENSITIVE Sensitive     PIP/TAZO <=4 SENSITIVE Sensitive     TOBRAMYCIN <=  1 SENSITIVE Sensitive     TRIMETH/SULFA Value in next row Sensitive      <=20 SENSITIVE(NOTE)    * ABUNDANT KLEBSIELLA OXYTOCA  Anaerobic culture     Status: None (Preliminary result)   Collection Time: 04/23/15  4:18 PM  Result Value Ref Range Status   Specimen Description ASPIRATE HEPATIC ABSCESS  Final   Special Requests NONE  Final   Gram Stain   Final    RARE WBC PRESENT, PREDOMINANTLY PMN NO SQUAMOUS EPITHELIAL CELLS SEEN MODERATE GRAM VARIABLE ROD Performed at [facility]    Culture PENDING  Incomplete   Report Status PENDING  Incomplete     Studies: No results found.  Scheduled Meds: . amLODipine  10 mg Oral Daily  . cefTRIAXone (ROCEPHIN)  IV  2 g Intravenous Q24H  . cloNIDine  0.2 mg Oral BID  . escitalopram  10 mg Oral Daily  . feeding supplement (ENSURE ENLIVE)  237 mL Oral BID BM  . metoprolol  50 mg Oral TID  . pantoprazole  40 mg Oral Daily  . potassium chloride  40 mEq Oral Once  . sodium chloride  3 mL Intravenous Q12H  . sucralfate  1 g Oral QID   Continuous Infusions: . 0.9 % NaCl with KCl 40 mEq / L 50 mL/hr (04/27/15 1136)    Principal Problem:   Liver abscess Active Problems:   HTN (hypertension)   Acute kidney injury (HCC)   Hypokalemia   Abdominal pain   Essential hypertension    Time spent: 25 minutes     Elliot Cousin, MD  [facility] Pager [phone]. If 7PM-7AM, please  contact night-coverage at [email or web address], password [facility] 04/27/2015, 4:02 PM  LOS: 5 days

## 2015-04-28 ENCOUNTER — Inpatient Hospital Stay (HOSPITAL_COMMUNITY): Payer: Medicare Other

## 2015-04-28 DIAGNOSIS — D649 Anemia, unspecified: Secondary | ICD-10-CM | POA: Diagnosis present

## 2015-04-28 LAB — ANAEROBIC CULTURE

## 2015-04-28 LAB — CBC
HCT: 29.2 % — ABNORMAL LOW (ref 36.0–46.0)
Hemoglobin: 9.4 g/dL — ABNORMAL LOW (ref 12.0–15.0)
MCH: 28 pg (ref 26.0–34.0)
MCHC: 32.2 g/dL (ref 30.0–36.0)
MCV: 86.9 fL (ref 78.0–100.0)
PLATELETS: 494 10*3/uL — AB (ref 150–400)
RBC: 3.36 MIL/uL — AB (ref 3.87–5.11)
RDW: 16 % — ABNORMAL HIGH (ref 11.5–15.5)
WBC: 9.9 10*3/uL (ref 4.0–10.5)

## 2015-04-28 LAB — BASIC METABOLIC PANEL
Anion gap: 8 (ref 5–15)
BUN: 6 mg/dL (ref 6–20)
CHLORIDE: 108 mmol/L (ref 101–111)
CO2: 25 mmol/L (ref 22–32)
CREATININE: 0.63 mg/dL (ref 0.44–1.00)
Calcium: 9.2 mg/dL (ref 8.9–10.3)
GFR calc Af Amer: >60 mL/min (ref >60)
GLUCOSE: 104 mg/dL — AB (ref 65–99)
POTASSIUM: 3.4 mmol/L — AB (ref 3.5–5.1)
SODIUM: 141 mmol/L (ref 135–145)

## 2015-04-28 MED ORDER — PROMETHAZINE HCL 12.5 MG PO TABS: 12.5000 mg | ORAL_TABLET | Freq: Four times a day (QID) | ORAL | Status: DC | PRN

## 2015-04-28 MED ORDER — ENSURE ENLIVE PO LIQD: 237.0000 mL | Freq: Two times a day (BID) | ORAL | Status: DC

## 2015-04-28 MED ORDER — CLONIDINE HCL 0.2 MG PO TABS
0.3000 mg | ORAL_TABLET | Freq: Two times a day (BID) | ORAL | Status: DC
  Administered 2015-04-28: 0.3 mg via ORAL
  Filled 2015-04-28: qty 1

## 2015-04-28 MED ORDER — AMLODIPINE BESYLATE 10 MG PO TABS: 10.0000 mg | ORAL_TABLET | Freq: Every day | ORAL | Status: AC

## 2015-04-28 MED ORDER — SALINE SPRAY 0.65 % NA SOLN: 1.0000 | NASAL | Status: DC | PRN

## 2015-04-28 MED ORDER — CLONIDINE HCL 0.2 MG PO TABS: 0.3000 mg | ORAL_TABLET | Freq: Two times a day (BID) | ORAL | Status: AC

## 2015-04-28 MED ORDER — POTASSIUM CHLORIDE CRYS ER 20 MEQ PO TBCR: 20.0000 meq | EXTENDED_RELEASE_TABLET | Freq: Two times a day (BID) | ORAL | Status: AC

## 2015-04-28 MED ORDER — OXYCODONE-ACETAMINOPHEN 7.5-325 MG PO TABS: 1.0000 | ORAL_TABLET | ORAL | Status: DC | PRN

## 2015-04-28 MED ORDER — IOHEXOL 300 MG/ML  SOLN
100.0000 mL | Freq: Once | INTRAMUSCULAR | Status: AC | PRN
  Administered 2015-04-28: 100 mL via INTRAVENOUS

## 2015-04-28 MED ORDER — DEXTROSE 5 % IV SOLN: 2.0000 g | INTRAVENOUS | Status: DC

## 2015-04-28 NOTE — Care Management Note (Signed)
Case Management Note  Patient Details  Name: Sheila [name] Hammersmith MRN: [id number] Date of Birth: December 28, 1955  Subjective/Objective:                    Action/Plan:   Expected Discharge Date:  04/26/15               Expected Discharge Plan:  Home w Home Health Services  In-House Referral:  NA  Discharge planning Services  CM Consult  Post Acute Care Choice:  Home Health Choice offered to:  Patient  DME Arranged:    DME Agency:     HH Arranged:  RN, IV Antibiotics HH Agency:  [facility]  Status of Service:  In process, will continue to follow  Medicare Important Message Given:  Yes Date Medicare IM Given:    Medicare IM give by:    Date Additional Medicare IM Given:    Additional Medicare Important Message give by:     If discussed at Long Length of Stay Meetings, dates discussed:    Additional Comments: Pt having PICC line placed today and then [facility]. Alroy BailiffLinda Lothian of [facility] is aware and will collect the pts information from the chart. HH services to start within 24 hours to start IV AB at home. No DME needs noted. Pt and pts nurse aware of discharge arrangements. Arlyss QueenBlackwell, Ernest Popowski Waldorfrowder, RN 04/28/2015, 1:16 PM

## 2015-04-28 NOTE — Care Management Important Message (Signed)
Important Message  Patient Details  Name: [name] Luddy MRN: [id number] Date of Birth: [birth year]   Medicare Important Message Given:  Yes    [name], Avory Rahimi Crowder, RN 04/28/2015, 1:16 PM

## 2015-04-28 NOTE — Discharge Summary (Signed)
Physician Discharge Summary  Sheila [name] ZOX:[id number] DOB: [birth year] DOA: 04/22/2015  PCP: [name], MD  Admit date: 04/22/2015 Discharge date: 04/28/2015  Time spent: Greater than 30 minutes  Recommendations for Outpatient Follow-up:  1. The patient was scheduled to follow-up with interventional radiology for further imaging and assessment of liver abscess drainage on 05/16/15. 2. Per the ID office, they will call the patient for follow-up with Dr. Drue Second.  3. Home health RN was ordered for daily IV antibiotic through 05/16/15. 4. Recommend follow-up of the patient's serum potassium. 5. Recommend follow-up of the patient's compliance with her antihypertensive medications.   Discharge Diagnoses:  1. Intrahepatic abscess. --Etiology thought to be secondary to delayed complication of cholecystectomy with a bile leak. 2. Right upper quadrant pain/right chest wall pain, thought to be secondary to intercostal pain from the percutaneous drain. 3. Acute kidney injury secondary to prerenal azotemia. 4. Essential hypertension, malignant. 5. Hypokalemia. 6. Normocytic anemia, likely to acute disease from infection. 7. CAD with a history of stenting.  Discharge Condition: Improved.  Diet recommendation: Heart healthy as tolerated.  Filed Weights   04/22/15 0855 04/22/15 1556  Weight: 56.7 kg (125 lb) 54.4 kg (119 lb 14.9 oz)    History of present illness:  The patient is a 60 year old woman with a history of hypertension, CAD with stents, peptic ulcer disease and cholecystectomy in October 2016, complicated by a bile leak. She presented to the ED on 04/22/2015 with a chief complaint of subjective fever, chills, and abdominal pain. CT of the abdomen and pelvis in the ED revealed interval development of a new 4 x 7 cm rim-enhancing fluid collection in the dome of the liver, suspicious for intrahepatic abscess. Her white blood cell count was elevated at 15.3. She was afebrile. She was  admitted for further evaluation and management.  Hospital Course:   Intrahepatic abscess Patient was started on broad-spectrum antibiotic therapy with Zosyn. Interventional radiologist was consulted for percutaneous drain placement for drainage. It was performed successfully on 04/23/15. Cultures were ordered. The cultures Cultures grew Klebsiella oxytoca. Patient had developed some fevers, but eventually defervesced. Her white blood cell count also continued to increase. Dr. Kerry Hough discussed the case with ID, Dr. Drue Second. She recommended another 3 weeks of IV rocephin-the end date for therapy would be 05/16/2015. Therefore, Zosyn was discontinued after 4 days. Dr. Drue Second recommended that the patient follow-up with her in the ID clinic in 3-4 weeks; their office will call. Arrangements were also made for the patient follow-up with interventional radiology for assessment of the drain and reimaging. -Prior to hospital discharge, the patient developed some worsening right-sided pain, mostly between the right upper quadrant and right chest wall. CT scan of her abdomen and pelvis was ordered for evaluation. The drainage catheter was in place and there was evidence of decreasing size of the main hepatic abscess, but there was a smaller second hepatic abscess measuring 2.1 cm. This was discussed with radiologist, Dr. Grace Isaac. He did not believe this area was large enough to drain. He stated that there was some fluid collection in the same location from the previous imaging, but it had increased slightly. He also felt that the patient's pain was likely from intercostal pain from where the percutaneous drain was entering -Patient was treated with analgesics which helped her relieve her pain. -Her white blood cell count eventually normalized prior to discharge.  Hypokalemia. Patient's serum potassium was low. She was started on potassium chloride orally. Eventually potassium chloride was added to  the IV fluids. Her  magnesium level was assessed and was within normal limits. Her serum potassium did improve. However, she was discharged on several more weeks of oral potassium supplementation.  Essential hypertension. Patient was restarted on her chronic antihypertensive medications, metoprolol and clonidine. However, amlodipine was not restarted, as she stated that she has stopped it. However it was restarted as her blood pressure was trending in the malignant range. She was also given as needed IV hydralazine. At the time of discharge, it was decided that the dose of clonidine would be increased from 0.2 twice a day to 0.3 twice a day. The patient was instructed to follow low salt diet. Further management will be deferred to her PCP.   Acute kidney injury, possibly secondary to prerenal azotemia and ATN.  The patient's creatinine was within normal limits on admission. However, her creatinine again to progressively increase to a high of 1.93. She did receive IV contrast on 04/21/2016. She also received Toradol. Her blood pressure had been fluctuating as well. Nevertheless, she was started on IV fluids, Toradol was discontinued, and her renal function was followed closely.  -Her renal function improved and her creatinine was within normal limits at the time of discharge.  History of multiple large gastric ulcers 02/2015 Thought to be secondary to NSAID use. Patient was continued on PPI and Carafate.   History of CAD, s/p stent placement. It remained stable. She had no complaints of angina. She was continued on her chronic medications.  Normocytic anemia. Patient's hemoglobin was 11.0 on admission. It waxed and waned during the hospitalization, but generally ranged from 8.7-9.5. There was no evidence of an acute GI or GU bleed. The anemia was likely secondary to acute disease from the abscess. -Would recommend following her hemoglobin in the outpatient setting.    Procedures:  CT-guided intrahepatic drain  placement 04/23/2015 by IR, Dr. Fredia Sorrow  Consultations:  Curbside consult with ID physician, Dr. Drue Second  IR, Dr. Fredia Sorrow  Discharge Exam: Filed Vitals:   04/28/15 0458 04/28/15 1509  BP: 185/82 164/78  Pulse: 73 78  Temp: 97.4 F (36.3 C) 97.8 F (36.6 C)  Resp: 20 20  General: 60 year old African-American woman in no acute distress. Oxygen saturation 99% on room air. Cardiovascular: S1, S2, with soft systolic murmur.  Respiratory: Clear to auscultation bilaterally with decreased breath sounds in the bases. Chest wall. Drain entering the right intercostal space with mild- tenderness which is slightly positional, but no fluctuance or significant erythema. Abdomen: Drain present in RUQ with minimal serous drainage in the bulb; positive bowel sounds; mild right upper quadrant tenderness without distention. Musculoskeletal/extremities: No pedal edema.    Discharge Instructions   Discharge Instructions    Diet - low sodium heart healthy    Complete by:  As directed      Discharge instructions    Complete by:  As directed   Avoid dehydration. Increase your activity. Sit out bed to the chair most of the day.     Increase activity slowly    Complete by:  As directed           Current Discharge Medication List    START taking these medications   Details  cefTRIAXone 2 g in dextrose 5 % 50 mL Inject 2 g into the vein daily. LAST DOSE TO BE GIVEN ON 05/16/2015    feeding supplement, ENSURE ENLIVE, (ENSURE ENLIVE) LIQD Take 237 mLs by mouth 2 (two) times daily between meals.    potassium chloride SA (  K-DUR,KLOR-CON) 20 MEQ tablet Take 1 tablet (20 mEq total) by mouth 2 (two) times daily. Qty: 30 tablet, Refills: 0    promethazine (PHENERGAN) 12.5 MG tablet Take 1 tablet (12.5 mg total) by mouth every 6 (six) hours as needed for nausea or vomiting. Qty: 30 tablet, Refills: 0      CONTINUE these medications which have CHANGED   Details  amLODipine (NORVASC) 10 MG tablet  Take 1 tablet (10 mg total) by mouth daily. Qty: 30 tablet, Refills: 6    cloNIDine (CATAPRES) 0.2 MG tablet Take 1.5 tablets (0.3 mg total) by mouth 2 (two) times daily. Qty: 90 tablet, Refills: 6    oxyCODONE-acetaminophen (PERCOCET) 7.5-325 MG tablet Take 1 tablet by mouth every 4 (four) hours as needed for severe pain. Qty: 30 tablet, Refills: 0      CONTINUE these medications which have NOT CHANGED   Details  acetaminophen (TYLENOL 8 HOUR) 650 MG CR tablet Take 650 mg by mouth every 8 (eight) hours as needed for pain.    escitalopram (LEXAPRO) 10 MG tablet Take 10 mg by mouth daily.     metoprolol (LOPRESSOR) 50 MG tablet Take 50 mg by mouth 3 (three) times daily.    omeprazole (PRILOSEC) 40 MG capsule Take 40 mg by mouth daily.    sucralfate (CARAFATE) 1 G tablet Take 1 tablet (1 g total) by mouth 4 (four) times daily. Qty: 42 tablet, Refills: 0      STOP taking these medications     OVER THE COUNTER MEDICATION      OVER THE COUNTER MEDICATION      isosorbide mononitrate (IMDUR) 30 MG 24 hr tablet      pantoprazole (PROTONIX) 40 MG tablet        Allergies  Allergen Reactions  . Hydrocodone-Acetaminophen Nausea And Vomiting   Follow-up Information    Follow up with [facility].   Contact information:   [address] [place] [id number] [phone]       Follow up with Judyann MunsonSNIDER, CYNTHIA, MD In 3 weeks.   Specialty:  Infectious Diseases   Why:  Infection physician.The office will call with appointment.   Contact informationSandi Mealy:   [address] [place] [address] [phone]       [facility] On 05/16/2015.   Why:  at 9:45 am. Do not eat 4 hours before appointment. Drink liquids only.   Contact information:   [address] [facility] [phone] [address]      Schedule an appointment as soon as possible for a visit with Geraldo PitterBLAND,VEITA J, MD.   Specialty:  Family Medicine    Why:  To be seen in 4 weeks   Contact information:   [address] [place] [address] [phone]        The results of significant diagnostics from this hospitalization (including imaging, microbiology, ancillary and laboratory) are listed below for reference.    Significant Diagnostic Studies: Ct Abdomen Pelvis W Contrast  04/28/2015  CLINICAL DATA:  Liver abscess. Drain in place. Pain at drainage site. Right upper quadrant pain. EXAM: CT ABDOMEN AND PELVIS WITH CONTRAST TECHNIQUE: Multidetector CT imaging of the abdomen and pelvis was performed using the standard protocol following bolus administration of intravenous contrast. CONTRAST:  100mL OMNIPAQUE IOHEXOL 300 MG/ML  SOLN COMPARISON:  CT 04/23/2015 FINDINGS: Small bilateral pleural effusions, new since prior study. Bibasilar atelectasis. Heart is upper limits  normal in size with trace pericardial effusion. Pigtail drainage catheter is in place within the right hepatic abscess with decreasing size of the main abscess. There is a separate low-density area within the liver which likely does not communicate with the area drained by the drainage catheter. This measures 2.1 x 1.7 cm on image 13. Biliary stent is noted in place, stable. Prior cholecystectomy. Spleen, pancreas, adrenals and kidneys are unremarkable. Aorta and iliac vessels are calcified, non aneurysmal. Stomach, large and small bowel are unremarkable. No free fluid, free air or adenopathy. Prior hysterectomy. No adnexal masses. Urinary bladder is unremarkable. No acute bony abnormality or focal bone lesion. IMPRESSION: Drainage catheter in place within the right hepatic abscess with decreasing size of the main hepatic abscess. There is a separate low-density area within the liver anterior to the drainage catheter which likely does not communicate and likely reflects a second small hepatic abscess measuring up to 2.1 cm. Electronically Signed   By: Charlett Nose M.D.   On:  04/28/2015 15:26   Ct Abdomen Pelvis W Contrast  04/22/2015  CLINICAL DATA:  Initial encounter for 1 week history of nausea and vomiting with fever and chills. Right upper quadrant pain and constipation. EXAM: CT ABDOMEN AND PELVIS WITH CONTRAST TECHNIQUE: Multidetector CT imaging of the abdomen and pelvis was performed using the standard protocol following bolus administration of intravenous contrast. CONTRAST:  OMNIPAQUE IOHEXOL 300 MG/ML  SOLN COMPARISON:  03/08/2015. FINDINGS: Lower chest: The lungs are clear wiithout focal pneumonia, edema, unremarkable Hepatobiliary: There is a new 3.2 x 4.7 x 3.2 cm cystic lesion in the posterior hepatic dome with rim enhancement and a collar of altered perfusion/parenchymal edema. A second smaller 13 mm satellite lesion with similar features is seen just medial to the dominant abnormality. Ill-defined low-attenuation in the subcapsular aspect of the anterior medial segment left liver was present previously and may represent focal fatty deposition. Gallbladder is surgically absent. No intra or extrahepatic biliary duct dilatation. Common bile duct drain is visualized in situ. The small 16 mm rim enhancing fluid collection seen in the gallbladder fossa on the previous study has resolved. A cluster of small calcifications seen in the subhepatic space just anterior to the right kidney persist and may represent spill gallstones. Pancreas: No focal mass lesion. No dilatation of the main duct. No intraparenchymal cyst. No peripancreatic edema. Spleen: No splenomegaly. No focal mass lesion. Adrenals/Urinary Tract: No adrenal nodule or mass. Kidneys are unremarkable. No evidence for hydroureter. The urinary bladder appears normal for the degree of distention. Stomach/Bowel: Stomach is nondistended. No gastric wall thickening. No evidence of outlet obstruction. Contrast material in the distal esophagus suggests reflux. Duodenum is normally positioned as is the ligament of  Treitz. No small bowel wall thickening. No small bowel dilatation. The terminal ileum is normal. The appendix is not visualized, but there is no edema or inflammation in the region of the cecum. No gross colonic mass. No colonic wall thickening. No substantial diverticular change. Vascular/Lymphatic: There is abdominal aortic atherosclerosis without aneurysm. There is no gastrohepatic or hepatoduodenal ligament lymphadenopathy. No intraperitoneal or retroperitoneal lymphadenopy. No pelvic sidewall lymphadenopathy. Reproductive: Uterus is surgically absent. There is no adnexal mass. Other: No intraperitoneal free fluid. Musculoskeletal: Bone windows reveal no worrisome lytic or sclerotic osseous lesions. IMPRESSION: 1. Interval development of a new 4.7 cm rim enhancing fluid collection in the dome of the liver. Imaging features highly suspicious for intrahepatic abscess. A second adjacent smaller fluid collection is compatible with a  satellite lesion. 2. 16 mm rim enhancing fluid collection seen in the cholecystectomy bed on the previous study has resolved in the interval. 3. Probable small gallstones again noted in the subhepatic space. 4. No evidence for biliary dilatation in this patient with common bile duct stent in situ. I discussed these findings by telephone with Dr. Rubin Payor at approximate lead 1238 hours on 04/22/2015. Electronically Signed   By: Kennith Center M.D.   On: 04/22/2015 12:39   Ct Image Guided Drainage By Percutaneous Catheter  04/23/2015  CLINICAL DATA:  Development of new right lobe hepatic abscess in the superior right lobe. The patient presents for percutaneous drainage. EXAM: CT GUIDED DRAINAGE OF BODY PART ABSCESS ANESTHESIA/SEDATION: 2.0 Mg IV Versed 100 mcg IV Fentanyl Total Moderate Sedation Time:  40 minutes PROCEDURE: The procedure, risks, benefits, and alternatives were explained to the patient. Questions regarding the procedure were encouraged and answered. The patient  understands and consents to the procedure. The right abdominal wall was prepped with Betadine in a sterile fashion, and a sterile drape was applied covering the operative field. A sterile gown and sterile gloves were used for the procedure. Local anesthesia was provided with 1% Lidocaine. CT was performed in a supine position. Under CT guidance, an 18 gauge trocar needle was advanced into the right lobe of the liver. Aspiration of fluid was performed at the level of abscess. Fluid sample was sent for culture analysis. The percutaneous tract was dilated over a wire and a 10 French percutaneous drainage catheter placed. Catheter position was confirmed by CT. The catheter was flushed with saline and connected to a suction bulb. The catheter was secured at the skin with a Prolene retention suture and StatLock device. COMPLICATIONS: None FINDINGS: The dominant unilocular component of hepatic abscess near the dome was targeted. There is a separate smaller adjacent abscess. Aspiration at the level of the dominant abscess yielded brown, purulent fluid. After drain placement, the drain is well positioned in the dominant abscess cavity and there is good return of fluid. IMPRESSION: CT-guided percutaneous drainage performed of a right lobe hepatic abscess. A 10 French drain was placed and attached to suction bulb drainage. Electronically Signed   By: Irish Lack M.D.   On: 04/23/2015 14:16    Microbiology: Recent Results (from the past 240 hour(s))  Blood culture (routine x 2)     Status: None   Collection Time: 04/22/15  1:13 PM  Result Value Ref Range Status   Specimen Description BLOOD RIGHT WRIST  Final   Special Requests BOTTLES DRAWN AEROBIC AND ANAEROBIC 6CC  Final   Culture NO GROWTH 5 DAYS  Final   Report Status 04/27/2015 FINAL  Final  Blood culture (routine x 2)     Status: None   Collection Time: 04/22/15  1:19 PM  Result Value Ref Range Status   Specimen Description BLOOD LEFT ARM  Final    Special Requests BOTTLES DRAWN AEROBIC AND ANAEROBIC 6CC  Final   Culture NO GROWTH 5 DAYS  Final   Report Status 04/27/2015 FINAL  Final  Culture, routine-abscess     Status: None   Collection Time: 04/23/15  4:17 PM  Result Value Ref Range Status   Specimen Description ASPIRATE HEPATIC ABSCESS  Final   Special Requests NONE  Final   Gram Stain   Final    RARE WBC PRESENT, PREDOMINANTLY PMN NO SQUAMOUS EPITHELIAL CELLS SEEN MODERATE GRAM VARIABLE ROD Performed at [facility]  Final    ABUNDANT KLEBSIELLA OXYTOCA Performed at [facility]    Report Status 04/25/2015 FINAL  Final   Organism ID, Bacteria KLEBSIELLA OXYTOCA  Final      Susceptibility   Klebsiella oxytoca - MIC*    AMPICILLIN >=32 RESISTANT Resistant     AMPICILLIN/SULBACTAM 16 INTERMEDIATE Intermediate     CEFAZOLIN 8 RESISTANT Resistant     CEFEPIME <=1 SENSITIVE Sensitive     CEFTAZIDIME <=1 SENSITIVE Sensitive     CEFTRIAXONE <=1 SENSITIVE Sensitive     CIPROFLOXACIN <=0.25 SENSITIVE Sensitive     GENTAMICIN <=1 SENSITIVE Sensitive     IMIPENEM <=0.25 SENSITIVE Sensitive     PIP/TAZO <=4 SENSITIVE Sensitive     TOBRAMYCIN <=1 SENSITIVE Sensitive     TRIMETH/SULFA Value in next row Sensitive      <=20 SENSITIVE(NOTE)    * ABUNDANT KLEBSIELLA OXYTOCA  Anaerobic culture     Status: None   Collection Time: 04/23/15  4:18 PM  Result Value Ref Range Status   Specimen Description ASPIRATE HEPATIC ABSCESS  Final   Special Requests NONE  Final   Gram Stain   Final    RARE WBC PRESENT, PREDOMINANTLY PMN NO SQUAMOUS EPITHELIAL CELLS SEEN MODERATE GRAM VARIABLE ROD Performed at [facility]    Culture   Final    NO ANAEROBES ISOLATED Performed at [facility]    Report Status 04/28/2015 FINAL  Final     Labs: Basic Metabolic Panel:  Recent Labs Lab 04/23/15 0836  04/24/15 0546 04/25/15 0555 04/26/15 0557 04/27/15 0600 04/28/15 0622  NA  --   --  137 139  139 140 141  K  --   < > 2.9* 3.0* 3.0* 3.5 3.4*  CL  --   --  101 107 108 112* 108  CO2  --   --  24 23 22  20* 25  GLUCOSE  --   --  149* 104* 113* 101* 104*  BUN  --   --  12 18 13 8 6   CREATININE  --   --  1.93* 1.42* 1.01* 0.78 0.63  CALCIUM  --   --  8.2* 8.0* 8.2* 8.5* 9.2  MG 1.8  --   --   --   --   --   --   < > = values in this interval not displayed. Liver Function Tests:  Recent Labs Lab 04/22/15 0929 04/23/15 0638  AST 55* 31  ALT 48 35  ALKPHOS 137* 106  BILITOT 0.8 0.7  PROT 8.3* 6.4*  ALBUMIN 3.4* 2.5*    Recent Labs Lab 04/22/15 0929  LIPASE 16   No results for input(s): AMMONIA in the last 168 hours. CBC:  Recent Labs Lab 04/22/15 0929  04/24/15 0546 04/25/15 0555 04/26/15 0557 04/27/15 0600 04/28/15 0622  WBC 15.3*  < > 17.6* 19.7* 21.2* 10.6* 9.9  NEUTROABS 13.1*  --   --   --  17.9*  --   --   HGB 11.0*  < > 9.4* 8.6* 8.3* 8.3* 9.4*  HCT 33.9*  < > 29.4* 26.1* 25.6* 24.6* 29.2*  MCV 89.2  < > 89.6 87.9 87.4 86.9 86.9  PLT 517*  < > 242 301 376 414* 494*  < > = values in this interval not displayed. Cardiac Enzymes: No results for input(s): CKTOTAL, CKMB, CKMBINDEX, TROPONINI in the last 168 hours. BNP: BNP (last 3 results) No results for input(s): BNP in the last 8760 hours.  ProBNP (last 3 results) No results for input(s): PROBNP in the last 8760 hours.  CBG: No results for input(s): GLUCAP in the last 168 hours.     Signed:  Elliot Cousin MD  FACP  [facility] 04/28/2015, 4:38 PM

## 2015-04-28 NOTE — Progress Notes (Signed)
JP tube flushed with 10ml saline, emptied and recorded, new canister to measure given patient.  PICC line flushed and clamped.  Discharge instructions reviewed with patient, questions answered, understanding verbalized.  Left via wheelchair for discharge home in care of family, [facility] to come tomorrow for IV antibiotics.  Stable at discharge

## 2015-04-29 ENCOUNTER — Telehealth: Payer: Self-pay | Admitting: Gastroenterology

## 2015-04-29 NOTE — Telephone Encounter (Signed)
Patient no showed for appt with Minerva AreolaEric 04/11/15. She has biliary stent in place. Recently discharged from hospital s/p drain placement for hepatic abscess.  She needs f/u ov for EGD (PUD surveillance)and ERCP with stent removal.

## 2015-04-30 DIAGNOSIS — K750 Abscess of liver: Secondary | ICD-10-CM | POA: Diagnosis not present

## 2015-04-30 NOTE — Telephone Encounter (Signed)
She needs an office visit with extender to wrap up loose ends

## 2015-05-01 NOTE — Telephone Encounter (Signed)
Pt has an appointment on 1/123/17 at 10:00 with [facility]. Letter in mail with appointment date and time

## 2015-05-02 ENCOUNTER — Other Ambulatory Visit (HOSPITAL_COMMUNITY)
Admission: RE | Admit: 2015-05-02 | Discharge: 2015-05-02 | Disposition: A | Discharge status: Home / Self Care | Payer: Medicare Other | Source: Other Acute Inpatient Hospital | Attending: Family Medicine | Admitting: Family Medicine

## 2015-05-02 DIAGNOSIS — K750 Abscess of liver: Secondary | ICD-10-CM | POA: Insufficient documentation

## 2015-05-02 LAB — CBC WITH DIFFERENTIAL/PLATELET
Basophils Absolute: 0 10*3/uL (ref 0.0–0.1)
Basophils Relative: 0 %
EOS PCT: 2 %
Eosinophils Absolute: 0.1 10*3/uL (ref 0.0–0.7)
HCT: 32.1 % — ABNORMAL LOW (ref 36.0–46.0)
Hemoglobin: 10.1 g/dL — ABNORMAL LOW (ref 12.0–15.0)
LYMPHS ABS: 2 10*3/uL (ref 0.7–4.0)
LYMPHS PCT: 26 %
MCH: 27.9 pg (ref 26.0–34.0)
MCHC: 31.5 g/dL (ref 30.0–36.0)
MCV: 88.7 fL (ref 78.0–100.0)
MONO ABS: 0.7 10*3/uL (ref 0.1–1.0)
MONOS PCT: 10 %
Neutro Abs: 4.8 10*3/uL (ref 1.7–7.7)
Neutrophils Relative %: 62 %
PLATELETS: 650 10*3/uL — AB (ref 150–400)
RBC: 3.62 MIL/uL — AB (ref 3.87–5.11)
RDW: 16.6 % — ABNORMAL HIGH (ref 11.5–15.5)
WBC: 7.7 10*3/uL (ref 4.0–10.5)

## 2015-05-02 LAB — CREATININE, SERUM
Creatinine, Ser: 0.55 mg/dL (ref 0.44–1.00)
GFR calc non Af Amer: >60 mL/min (ref >60)

## 2015-05-02 LAB — BUN: BUN: 6 mg/dL (ref 6–20)

## 2015-05-04 ENCOUNTER — Other Ambulatory Visit: Payer: Self-pay | Admitting: Family Medicine

## 2015-05-04 ENCOUNTER — Other Ambulatory Visit (HOSPITAL_COMMUNITY): Payer: Self-pay | Admitting: Interventional Radiology

## 2015-05-04 DIAGNOSIS — K750 Abscess of liver: Secondary | ICD-10-CM

## 2015-05-09 DIAGNOSIS — K750 Abscess of liver: Secondary | ICD-10-CM | POA: Diagnosis not present

## 2015-05-14 DIAGNOSIS — K750 Abscess of liver: Secondary | ICD-10-CM | POA: Diagnosis not present

## 2015-05-15 ENCOUNTER — Ambulatory Visit: Payer: Medicare Other | Admitting: Gastroenterology

## 2015-05-15 ENCOUNTER — Other Ambulatory Visit: Payer: Medicare Other

## 2015-05-16 ENCOUNTER — Other Ambulatory Visit (HOSPITAL_COMMUNITY)
Admission: AD | Admit: 2015-05-16 | Discharge: 2015-05-16 | Disposition: A | Discharge status: Home / Self Care | Payer: Medicare Other | Source: Other Acute Inpatient Hospital | Attending: Internal Medicine | Admitting: Internal Medicine

## 2015-05-16 ENCOUNTER — Other Ambulatory Visit: Payer: Medicare Other

## 2015-05-16 ENCOUNTER — Telehealth: Payer: Self-pay

## 2015-05-16 ENCOUNTER — Telehealth: Payer: Self-pay | Admitting: *Deleted

## 2015-05-16 ENCOUNTER — Ambulatory Visit
Admission: RE | Admit: 2015-05-16 | Discharge: 2015-05-16 | Disposition: A | Discharge status: Home / Self Care | Payer: Medicare Other | Source: Ambulatory Visit | Attending: Family Medicine | Admitting: Family Medicine

## 2015-05-16 ENCOUNTER — Ambulatory Visit
Admission: RE | Admit: 2015-05-16 | Discharge: 2015-05-16 | Disposition: A | Discharge status: Home / Self Care | Payer: Medicare Other | Source: Ambulatory Visit | Attending: Interventional Radiology | Admitting: Interventional Radiology

## 2015-05-16 ENCOUNTER — Other Ambulatory Visit: Payer: Self-pay | Admitting: Family Medicine

## 2015-05-16 DIAGNOSIS — K750 Abscess of liver: Secondary | ICD-10-CM | POA: Insufficient documentation

## 2015-05-16 DIAGNOSIS — K651 Peritoneal abscess: Secondary | ICD-10-CM | POA: Diagnosis not present

## 2015-05-16 LAB — CBC WITH DIFFERENTIAL/PLATELET
BASOS PCT: 1 %
Basophils Absolute: 0.1 10*3/uL (ref 0.0–0.1)
EOS ABS: 0.5 10*3/uL (ref 0.0–0.7)
EOS PCT: 7 %
HCT: 36.3 % (ref 36.0–46.0)
HEMOGLOBIN: 11.3 g/dL — AB (ref 12.0–15.0)
LYMPHS ABS: 2.5 10*3/uL (ref 0.7–4.0)
Lymphocytes Relative: 36 %
MCH: 27.8 pg (ref 26.0–34.0)
MCHC: 31.1 g/dL (ref 30.0–36.0)
MCV: 89.4 fL (ref 78.0–100.0)
MONO ABS: 0.4 10*3/uL (ref 0.1–1.0)
MONOS PCT: 6 %
Neutro Abs: 3.5 10*3/uL (ref 1.7–7.7)
Neutrophils Relative %: 50 %
PLATELETS: 467 10*3/uL — AB (ref 150–400)
RBC: 4.06 MIL/uL (ref 3.87–5.11)
RDW: 15.5 % (ref 11.5–15.5)
WBC: 6.9 10*3/uL (ref 4.0–10.5)

## 2015-05-16 LAB — BUN: BUN: 10 mg/dL (ref 6–20)

## 2015-05-16 LAB — CREATININE, SERUM
CREATININE: 0.67 mg/dL (ref 0.44–1.00)
GFR calc Af Amer: >60 mL/min (ref >60)

## 2015-05-16 MED ORDER — IOPAMIDOL (ISOVUE-300) INJECTION 61%
100.0000 mL | Freq: Once | INTRAVENOUS | Status: AC | PRN
  Administered 2015-05-16: 100 mL via INTRAVENOUS

## 2015-05-16 NOTE — Telephone Encounter (Signed)
Gave verbal order per Dr. Drue Second to Efraim Kaufmann at [facility] to extend IV antibiotics for three more weeks. Wendall Mola

## 2015-05-16 NOTE — Progress Notes (Signed)
Referring Physician(s): Dr. Irene Limbo, Dr. Drue Second  Chief Complaint: Follow up hepatic abscess drainage  Subjective: 60 yo female who developed hepatic abscess and had subsequent perc drain placement on 04/23/15. She has been on IV Rocephin 2g daily via PICC since her discharge from [facility] on 1/6. She is here today for follow and had a CT scan of the abdomen earlier today. She feels well. Minimal pain at drain site. She reports scant output and she has continued to flush her drain daily as directed. She denies fevers, chill, N/V. Appetite is good. She tells me she is scheduled to see Dr. Drue Second later this week.  Allergies: Hydrocodone-acetaminophen  Medications: Prior to Admission medications   Medication Sig Start Date End Date Taking? Authorizing Provider  acetaminophen (TYLENOL 8 HOUR) 650 MG CR tablet Take 650 mg by mouth every 8 (eight) hours as needed for pain.    Historical Provider, MD  amLODipine (NORVASC) 10 MG tablet Take 1 tablet (10 mg total) by mouth daily. 04/28/15   Elliot Cousin, MD  cefTRIAXone 2 g in dextrose 5 % 50 mL Inject 2 g into the vein daily. LAST DOSE TO BE GIVEN ON 05/16/2015 04/28/15   Elliot Cousin, MD  cloNIDine (CATAPRES) 0.2 MG tablet Take 1.5 tablets (0.3 mg total) by mouth 2 (two) times daily. 04/28/15   Elliot Cousin, MD  escitalopram (LEXAPRO) 10 MG tablet Take 10 mg by mouth daily.     Historical Provider, MD  feeding supplement, ENSURE ENLIVE, (ENSURE ENLIVE) LIQD Take 237 mLs by mouth 2 (two) times daily between meals. 04/28/15   Elliot Cousin, MD  metoprolol (LOPRESSOR) 50 MG tablet Take 50 mg by mouth 3 (three) times daily.    Historical Provider, MD  omeprazole (PRILOSEC) 40 MG capsule Take 40 mg by mouth daily.    Historical Provider, MD  oxyCODONE-acetaminophen (PERCOCET) 7.5-325 MG tablet Take 1 tablet by mouth every 4 (four) hours as needed for severe pain. 04/28/15   Elliot Cousin, MD  potassium chloride SA (K-DUR,KLOR-CON) 20 MEQ  tablet Take 1 tablet (20 mEq total) by mouth 2 (two) times daily. 04/28/15   Elliot Cousin, MD  promethazine (PHENERGAN) 12.5 MG tablet Take 1 tablet (12.5 mg total) by mouth every 6 (six) hours as needed for nausea or vomiting. 04/28/15   Elliot Cousin, MD  sucralfate (CARAFATE) 1 G tablet Take 1 tablet (1 g total) by mouth 4 (four) times daily. 03/13/15   Anice Paganini, NP     Vital Signs: BP 121/64 mmHg  Pulse 78  Temp(Src) 98.3 F (36.8 C)  SpO2 82%  Physical Exam NAD Lungs: CTA Heart: Regular Abd: soft, ND, NT RUQ drain intact, site clean. Scant output in drain, likely residual flush  Imaging: CT abdomen 05/16/15-Full report not out yet. Dominant abscess is essentially resolved, Drain remains in position. Smaller adjacent collection/abscess, Previously ~2cm in size, now measures ~1.5cm. No new/additional lesions/collections noted.  Labs:  CBC:  Recent Labs  04/26/15 0557 04/27/15 0600 04/28/15 0622 05/02/15 1005  WBC 21.2* 10.6* 9.9 7.7  HGB 8.3* 8.3* 9.4* 10.1*  HCT 25.6* 24.6* 29.2* 32.1*  PLT 376 414* 494* 650*    COAGS:  Recent Labs  04/23/15 0836  INR 1.21  APTT 37    BMP:  Recent Labs  04/25/15 0555 04/26/15 0557 04/27/15 0600 04/28/15 0622 05/02/15 1005  NA 139 139 140 141  --   K 3.0* 3.0* 3.5 3.4*  --   CL 107 108 112*  108  --   CO2 23 22 20* 25  --   GLUCOSE 104* 113* 101* 104*  --   BUN CALCIUM 8.0* 8.2* 8.5* 9.2  --   CREATININE 1.42* 1.01* 0.78 0.63 0.55  GFRNONAA 39* 59* >60 >60 >60  GFRAA 45* >60 >60 >60 >60    LIVER FUNCTION TESTS:  Recent Labs  03/08/15 1331 03/09/15 0614 04/22/15 0929 04/23/15 0638  BILITOT 0.7 0.5 0.8 0.7  AST 39 31 55* 31  ALT 32 27 48 35  ALKPHOS 116 95 137* 106  PROT 8.4* 6.9 8.3* 6.4*  ALBUMIN 4.1 3.3* 3.4* 2.5*    Assessment and Plan: Hepatic abscess s/p perc drain 04/23/15 Reviewed imaging with Dr. Archer Asa and discussed case with Dr. Drue Second. Feel best to continue IV abx as  before. Given overall improvement, both clinically and radiographically, cont present treatment a few more weeks, repeat CT in another 2-3 weeks with hopes for drain removal to follow. Pt will continue to flush daily as before and notify us of any new sxs or changes in drainage output.  Electronically Signed: Brayton El 62/24/2017, 10:49 AM   I spent a total of 25 Minutes at the the patient's bedside AND on the patient's hospital floor or unit, greater than 50% of which was counseling/coordinating care for hepatic abscess drain management

## 2015-05-16 NOTE — Telephone Encounter (Signed)
Sheila [name] (nurse from [facility]) called to verify patient's appointment on 2/7 at 11 with LSL, Nurse is requesting Saline Irrigation to be called in to [facility] in 10 ml syringes she uses once a day. Please advise if we can do this. Nurse called from [phone]

## 2015-05-17 ENCOUNTER — Telehealth: Payer: Self-pay | Admitting: *Deleted

## 2015-05-17 DIAGNOSIS — K750 Abscess of liver: Secondary | ICD-10-CM | POA: Diagnosis not present

## 2015-05-17 NOTE — Telephone Encounter (Signed)
Routing to LSL. Is this something we can do?

## 2015-05-17 NOTE — Telephone Encounter (Signed)
Not typically. Can we find out if interventional radiology would write for this?

## 2015-05-17 NOTE — Telephone Encounter (Signed)
Homehealth RN calling for rx for saline flushes for patient's biliary drain.  RN directed her to [facility]. Andree Coss, RN

## 2015-05-18 ENCOUNTER — Ambulatory Visit (INDEPENDENT_AMBULATORY_CARE_PROVIDER_SITE_OTHER): Payer: Medicare Other | Admitting: Infectious Diseases

## 2015-05-18 ENCOUNTER — Encounter: Payer: Self-pay | Admitting: Infectious Diseases

## 2015-05-18 VITALS — BP 113/73 | HR 70 | Temp 98.5°F | Wt 115.0 lb

## 2015-05-18 DIAGNOSIS — I10 Essential (primary) hypertension: Secondary | ICD-10-CM | POA: Diagnosis not present

## 2015-05-18 DIAGNOSIS — N179 Acute kidney failure, unspecified: Secondary | ICD-10-CM | POA: Diagnosis not present

## 2015-05-18 DIAGNOSIS — K750 Abscess of liver: Secondary | ICD-10-CM | POA: Diagnosis not present

## 2015-05-18 NOTE — Assessment & Plan Note (Signed)
She doing very well on her current meds. She is asx.

## 2015-05-18 NOTE — Assessment & Plan Note (Signed)
Her last labs from home health show nl Cr.  Explained to her this could be a function of medications she got in hospital (toradol, contrast). She explains to me that she did not eat or drink anything for 21 days prior to admission.   Will mark as resolved.

## 2015-05-18 NOTE — Progress Notes (Signed)
   Subjective:    Patient ID: Sheila [name], female    DOB: 07/02/1955, 60 y.o.   MRN: [id number]  HPI 60 yo F with hx of PUD, cholecystectomy 01/2015 with postop bile leak, presented 04-22-15 with subjective fever, chills, abd pain. CT ab/pelvis revealed liver abscess.She was started on zosyn.  She was taken to IR on 1-1 and a drain was placed (Cx K oxytoca- I unasyn, R- anc/cef). She did well and was able to be d/c home on 1-6 with IV ceftriaxone with a planned stop date 1-24.  She still has PIC. Was seen at IR yesterday and had her anbx extended 2-3 weeks after repeat CT showed: 1. Near-total interval resolution of right hepatic abscess. The main abscess cavity has completely resolved. The small satellite abscess is decreased presently measuring up to 14 mm compared to 20 mm previously. 2. Persistent nonspecific low-attenuation region measuring 19 mm in the more inferior right hepatic lobe. Continued attention on follow-up imaging. 3. Additional ancillary findings as above without significant interval change.  Still has drainage from liver drain- ~ teaspoon.  No problems with PIC- no erythema, no redness, no d/c.  Feels Ok, gets tired easily. Otilio Carpen has SOB, lives alone and does all her own ADLs.   PMHx, Soc, FHx reviewed, updated in EPIC.   Review of Systems  Constitutional: Negative for fever and appetite change.  Respiratory: Negative for shortness of breath.   Cardiovascular: Negative for chest pain.  Gastrointestinal: Positive for abdominal pain. Negative for diarrhea and constipation.  Genitourinary: Negative for difficulty urinating.  Neurological: Negative for dizziness.  DOE+.  abd pain is better, not excruciating as previous.      Objective:   Physical Exam  Constitutional: She appears well-developed and well-nourished.  HENT:  Mouth/Throat: No oropharyngeal exudate.  Eyes: EOM are normal. Pupils are equal, round, and reactive to light.  Neck: Neck supple.    Cardiovascular: Normal rate and regular rhythm.   Pulmonary/Chest: Effort normal and breath sounds normal.  Abdominal: Soft. Bowel sounds are normal. There is no tenderness. There is no rebound.    Musculoskeletal: She exhibits no edema.       Arms: Lymphadenopathy:    She has no cervical adenopathy.      Assessment & Plan:

## 2015-05-18 NOTE — Assessment & Plan Note (Signed)
Pt is doing very well.  IR has extended her anbx.  Agree with this. I explained the size of her abscess on the 1-24 CT scan.  She will f/u with them in 2 weeks for repeat CT scan.  She will f/u with [place] prn

## 2015-05-23 ENCOUNTER — Other Ambulatory Visit (HOSPITAL_COMMUNITY)
Admission: AD | Admit: 2015-05-23 | Discharge: 2015-05-23 | Disposition: A | Discharge status: Home / Self Care | Payer: Medicare Other | Source: Other Acute Inpatient Hospital | Attending: Family Medicine | Admitting: Family Medicine

## 2015-05-23 DIAGNOSIS — Z029 Encounter for administrative examinations, unspecified: Secondary | ICD-10-CM | POA: Insufficient documentation

## 2015-05-23 LAB — CBC WITH DIFFERENTIAL/PLATELET
BASOS ABS: 0.1 10*3/uL (ref 0.0–0.1)
BASOS PCT: 1 %
Eosinophils Absolute: 0.2 10*3/uL (ref 0.0–0.7)
Eosinophils Relative: 3 %
HEMATOCRIT: 34.8 % — AB (ref 36.0–46.0)
HEMOGLOBIN: 10.6 g/dL — AB (ref 12.0–15.0)
Lymphocytes Relative: 44 %
Lymphs Abs: 3.5 10*3/uL (ref 0.7–4.0)
MCH: 27.6 pg (ref 26.0–34.0)
MCHC: 30.5 g/dL (ref 30.0–36.0)
MCV: 90.6 fL (ref 78.0–100.0)
MONO ABS: 0.5 10*3/uL (ref 0.1–1.0)
Monocytes Relative: 7 %
NEUTROS ABS: 3.5 10*3/uL (ref 1.7–7.7)
NEUTROS PCT: 45 %
PLATELETS: 523 10*3/uL — AB (ref 150–400)
RBC: 3.84 MIL/uL — AB (ref 3.87–5.11)
RDW: 16 % — AB (ref 11.5–15.5)
WBC: 7.7 10*3/uL (ref 4.0–10.5)

## 2015-05-23 LAB — CREATININE, SERUM: Creatinine, Ser: 0.57 mg/dL (ref 0.44–1.00)

## 2015-05-23 LAB — BUN: BUN: 15 mg/dL (ref 6–20)

## 2015-05-23 NOTE — Telephone Encounter (Signed)
Tried to call nurse- [facility]

## 2015-05-30 ENCOUNTER — Ambulatory Visit (INDEPENDENT_AMBULATORY_CARE_PROVIDER_SITE_OTHER): Payer: Medicare Other | Admitting: Gastroenterology

## 2015-05-30 ENCOUNTER — Encounter: Payer: Self-pay | Admitting: Gastroenterology

## 2015-05-30 VITALS — BP 118/78 | HR 75 | Temp 98.6°F | Ht 61.0 in | Wt 122.4 lb

## 2015-05-30 DIAGNOSIS — K929 Disease of digestive system, unspecified: Secondary | ICD-10-CM | POA: Diagnosis not present

## 2015-05-30 DIAGNOSIS — Z9889 Other specified postprocedural states: Secondary | ICD-10-CM | POA: Insufficient documentation

## 2015-05-30 DIAGNOSIS — K838 Other specified diseases of biliary tract: Secondary | ICD-10-CM | POA: Diagnosis not present

## 2015-05-30 DIAGNOSIS — K9189 Other postprocedural complications and disorders of digestive system: Secondary | ICD-10-CM

## 2015-05-30 DIAGNOSIS — K279 Peptic ulcer, site unspecified, unspecified as acute or chronic, without hemorrhage or perforation: Secondary | ICD-10-CM

## 2015-05-30 NOTE — Progress Notes (Signed)
Primary Care Physician: [name], MD  Primary Gastroenterologist:  Roetta Sessions, MD   Chief Complaint  Patient presents with  . Follow-up    HPI: Sheila [name] is a 60 y.o. female here for follow up for consideration of EGD for peptic ulcer disease surveillance and ERCP with biliary stent removal. She had ERCP with sphincterotomy and stent placement back in October when she presented with biliary leak postoperatively. Required EGD in November for vomiting and epigastric pain and found to have multiple large gastric ulcers, bile duct stent seen in the second portion of the duodenum. Unfortunately she continued to have difficulty and ultimately required percutaneous catheter placement for right lobe hepatic abscess on 04/23/2015. She has been on IV antibiotics via PICC line. Her last imaging was on January 24 initial abscess collection essentially completely resolved. The adjacent satellite fluid collection measures slightly smaller at 14 x 13 mm compared to 20 x 17 mm. Nonspecific low attenuation region in the more inferior right hepatic lobe remains unchanged and nonspecific at 1.9 cm. Plastic biliary stent noted.  Patient tells me she takes her last dose of antibiotics on February 13 and should be having a follow-up CT a couple days later. She would like to postpone biliary stent removal until after this is been done.  Overall patient states she feels better each day. No significant abdominal pain as long as she eats multiple small meals daily. No heartburn. Bowel movements are regular. No blood in the stool or melena. No fever.   Current Outpatient Prescriptions  Medication Sig Dispense Refill  . acetaminophen (TYLENOL 8 HOUR) 650 MG CR tablet Take 650 mg by mouth every 8 (eight) hours as needed for pain.    Marland Kitchen amLODipine (NORVASC) 10 MG tablet Take 1 tablet (10 mg total) by mouth daily. 30 tablet 6  . cefTRIAXone 2 g in dextrose 5 % 50 mL Inject 2 g into the vein daily. LAST  DOSE TO BE GIVEN ON 05/16/2015    . cloNIDine (CATAPRES) 0.2 MG tablet Take 1.5 tablets (0.3 mg total) by mouth 2 (two) times daily. 90 tablet 6  . escitalopram (LEXAPRO) 10 MG tablet Take 10 mg by mouth daily.     . feeding supplement, ENSURE ENLIVE, (ENSURE ENLIVE) LIQD Take 237 mLs by mouth 2 (two) times daily between meals.    . metoprolol (LOPRESSOR) 50 MG tablet Take 50 mg by mouth 3 (three) times daily.    Marland Kitchen omeprazole (PRILOSEC) 40 MG capsule Take 40 mg by mouth daily.    Marland Kitchen oxyCODONE-acetaminophen (PERCOCET) 7.5-325 MG tablet Take 1 tablet by mouth every 4 (four) hours as needed for severe pain. 30 tablet 0  . potassium chloride SA (K-DUR,KLOR-CON) 20 MEQ tablet Take 1 tablet (20 mEq total) by mouth 2 (two) times daily. 30 tablet 0  . promethazine (PHENERGAN) 12.5 MG tablet Take 1 tablet (12.5 mg total) by mouth every 6 (six) hours as needed for nausea or vomiting. 30 tablet 0  . sucralfate (CARAFATE) 1 G tablet Take 1 tablet (1 g total) by mouth 4 (four) times daily. 42 tablet 0   No current facility-administered medications for this visit.    Allergies as of 05/30/2015 - Review Complete 05/30/2015  Allergen Reaction Noted  . Hydrocodone-acetaminophen Nausea And Vomiting 02/17/2015   Past Medical History  Diagnosis Date  . Myocardial infarction (HCC) 2012   . CVD (cardiovascular disease)     Stent to mid RCA 2012 [name] 2.5 X 12 mm  .  Systemic lupus (HCC)     dx 1999. No further flares.   . Anxiety   . Hypertension   . Arthritis   . Hypercholesterolemia   . PUD (peptic ulcer disease)     Dr. in [place] in early 2016   Past Surgical History  Procedure Laterality Date  . Coronary angioplasty with stent placement  2012, 2013.    [name] DES to mid RCA. Stents X 2 to unknown artery  . Abdominal hysterectomy    . Tubal ligation    . Breast lumpectomy    . Cholecystectomy N/A 01/27/2015    Procedure: CHOLECYSTECTOMY;  Surgeon: Mark Jenkins, MD;  Location: [facility];  Service:  General;  Laterality: N/A;  converted to open at 0941  . Esophagogastroduodenoscopy      [place]:PUD per patient  . Colonoscopy  2008    [place], normal.   . Ercp N/A 02/08/2015    RMR:post-op biliary leak/s/p stent placement  . Biliary stent placement N/A 02/08/2015    Procedure: BILIARY STENT PLACEMENT;  Surgeon: Robert M Rourk, MD;  Location: [facility];  Service: Endoscopy;  Laterality: N/A;  . Sphincterotomy N/A 02/08/2015    Procedure: SPHINCTEROTOMY;  Surgeon: Robert M Rourk, MD;  Location: [facility];  Service: Endoscopy;  Laterality: N/A;  . Esophagogastroduodenoscopy N/A 03/10/2015    SLF:multiple large gastric ulcers in gastric antrum at the angularis and in the cardia/bile duct stent seen in the scond portion. esophageal stricture.    Family History  Problem Relation Age of Onset  . CAD Father     CABG  . Heart attack Brother     PTCA  . Hypertension Sister   . Hypertension Father   . Colon cancer Neg Hx   . Liver disease Neg Hx    Social History   Social History  . Marital Status: Legally Separated    Spouse Name: N/A  . Number of Children: 2  . Years of Education: N/A   Occupational History  . Nutritionist    Social History Main Topics  . Smoking status: Former Smoker    Types: Cigarettes  . Smokeless tobacco: Never Used  . Alcohol Use: No  . Drug Use: No  . Sexual Activity: No   Other Topics Concern  . None   Social History Narrative   Lives alone. Moved from [place] 2 years ago.     ROS:  General: Negative for anorexia, weight loss, fever, chills, fatigue, weakness. ENT: Negative for hoarseness, difficulty swallowing , nasal congestion. CV: Negative for chest pain, angina, palpitations, dyspnea on exertion, peripheral edema.  Respiratory: Negative for dyspnea at rest, dyspnea on exertion, cough, sputum, wheezing.  GI: See history of present illness. GU:  Negative for dysuria, hematuria, urinary incontinence, urinary frequency, nocturnal urination.   Endo: Negative for unusual weight change.    Physical Examination:   BP 118/78 mmHg  Pulse 75  Temp(Src) 98.6 F (37 C) (Oral)  Ht 5' 1" (1.549 m)  Wt 122 lb 6 oz (55.509 kg)  BMI 23.13 kg/m2  General: Well-nourished, well-developed in no acute distress.  Eyes: No icterus. Mouth: Oropharyngeal mucosa moist and pink , no lesions erythema or exudate. Lungs: Clear to auscultation bilaterally.  Heart: Regular rate and rhythm, no murmurs rubs or gallops.  Abdomen: Bowel sounds are normal, nontender, nondistended, no hepatosplenomegaly or masses, no abdominal bruits or hernia , no rebound or guarding.  Right upper quadrant percutaneous drain noted. Extremities: No lower extremity edema. No clubbing or deformities. Neuro:   Alert and oriented x 4   Skin: Warm and dry, no jaundice.   Psych: Alert and cooperative, normal mood and affect.  Labs:  Labs from January 2017 noted.    Imaging Studies: Ct Abdomen Pelvis W Contrast  05/16/2015  CLINICAL DATA:  59-year-old female with a history of right hepatic abscess. Percutaneous drain placement on 04/23/2015. EXAM: CT ABDOMEN AND PELVIS WITH CONTRAST TECHNIQUE: Multidetector CT imaging of the abdomen and pelvis was performed using the standard protocol following bolus administration of intravenous contrast. CONTRAST:  100mL ISOVUE-300 IOPAMIDOL (ISOVUE-300) INJECTION 61% COMPARISON:  Most recent prior CT abdomen/ pelvis 04/28/2015 FINDINGS: Lower Chest: Interval resolution of small bilateral pleural effusions and significant decrease in atelectasis. There is trace residual atelectasis versus scarring in the right lower lobe. Otherwise, visualized lung bases are clear. The tip of a PICC terminates in the mid right atrium. The heart is normal in size. Calcifications present along the course of the coronary arteries. Trace pericardial fluid versus thickening slightly improved. Abdomen: Unremarkable CT appearance of the stomach, duodenum, adrenal glands  and pancreas. A wedge-shaped low-attenuation area in the superior aspect of the spleen may represent a splenic infarct. Well-positioned abscess catheter without interval change in placement in the right hepatic lobe. The initial abscess collection has essentially completely resolved. The adjacent satellite fluid collection measures slightly smaller at 14 x 13 mm compared to approximately 20 x 17 mm previously. There is some persistent surrounding edema. A nonspecific low-attenuation region in the more inferior right hepatic lobe remains unchanged and nonspecific at 1.9 cm. No intra extrahepatic biliary ductal dilatation. The gallbladder is surgically absent. A plastic biliary stent is present. Unremarkable appearance of the bilateral kidneys. No focal solid lesion, hydronephrosis or nephrolithiasis. No focal bowel wall thickening or evidence of obstruction. Pelvis: Surgical changes of prior hysterectomy. Decompressed bladder. No free fluid or suspicious adenopathy. Bones/Soft Tissues: No acute fracture or aggressive appearing lytic or blastic osseous lesion. Vascular: Atherosclerotic vascular disease without significant stenosis or aneurysmal dilatation. IMPRESSION: 1. Near-total interval resolution of right hepatic abscess. The main abscess cavity has completely resolved. The small satellite abscess is decreased presently measuring up to 14 mm compared to 20 mm previously. 2. Persistent nonspecific low-attenuation region measuring 19 mm in the more inferior right hepatic lobe. Continued attention on follow-up imaging. 3. Additional ancillary findings as above without significant interval change. Signed, Heath K. McCullough, MD [facility] Electronically Signed   By: Heath  McCullough M.D.   On: 05/16/2015 17:02        

## 2015-05-30 NOTE — Patient Instructions (Signed)
Please have your labs done. We will call to schedule your procedure to reevaluate ulcers and remove biliary stent once I discuss your case with Dr. Jena Gauss.

## 2015-05-31 ENCOUNTER — Other Ambulatory Visit (HOSPITAL_COMMUNITY)
Admission: AD | Admit: 2015-05-31 | Discharge: 2015-05-31 | Disposition: A | Discharge status: Home / Self Care | Payer: Medicare Other | Source: Other Acute Inpatient Hospital | Attending: Internal Medicine | Admitting: Internal Medicine

## 2015-05-31 ENCOUNTER — Other Ambulatory Visit (HOSPITAL_COMMUNITY): Payer: Self-pay | Admitting: Interventional Radiology

## 2015-05-31 DIAGNOSIS — K750 Abscess of liver: Secondary | ICD-10-CM | POA: Diagnosis not present

## 2015-05-31 LAB — HEPATIC FUNCTION PANEL
ALBUMIN: 3.6 g/dL (ref 3.5–5.0)
ALT: 15 U/L (ref 14–54)
AST: 21 U/L (ref 15–41)
Alkaline Phosphatase: 108 U/L (ref 38–126)
Bilirubin, Direct: <0.1 mg/dL — ABNORMAL LOW (ref 0.1–0.5)
TOTAL PROTEIN: 7.3 g/dL (ref 6.5–8.1)
Total Bilirubin: 0.3 mg/dL (ref 0.3–1.2)

## 2015-05-31 LAB — CBC WITH DIFFERENTIAL/PLATELET
BASOS PCT: 1 %
Basophils Absolute: 0 10*3/uL (ref 0.0–0.1)
EOS ABS: 0.2 10*3/uL (ref 0.0–0.7)
EOS PCT: 4 %
HCT: 34.6 % — ABNORMAL LOW (ref 36.0–46.0)
HEMOGLOBIN: 10.4 g/dL — AB (ref 12.0–15.0)
LYMPHS ABS: 2 10*3/uL (ref 0.7–4.0)
Lymphocytes Relative: 33 %
MCH: 27.3 pg (ref 26.0–34.0)
MCHC: 30.1 g/dL (ref 30.0–36.0)
MCV: 90.8 fL (ref 78.0–100.0)
MONO ABS: 0.4 10*3/uL (ref 0.1–1.0)
MONOS PCT: 7 %
Neutro Abs: 3.4 10*3/uL (ref 1.7–7.7)
Neutrophils Relative %: 55 %
PLATELETS: 461 10*3/uL — AB (ref 150–400)
RBC: 3.81 MIL/uL — ABNORMAL LOW (ref 3.87–5.11)
RDW: 15.9 % — AB (ref 11.5–15.5)
WBC: 6.1 10*3/uL (ref 4.0–10.5)

## 2015-05-31 LAB — CREATININE, SERUM: Creatinine, Ser: 0.64 mg/dL (ref 0.44–1.00)

## 2015-05-31 LAB — BUN: BUN: 13 mg/dL (ref 6–20)

## 2015-05-31 NOTE — Progress Notes (Signed)
cc'ed to pcp °

## 2015-05-31 NOTE — Assessment & Plan Note (Addendum)
ERCP with plastic biliary stent placement, sphincterotomy back in October for postoperative bile leak. Unfortunately she developed hepatic abscess and currently has percutaneous catheter placed for drainage and on IV antibiotics. Followed by infectious disease and interventional radiology. Completes antibiotics on February 13. Plan for follow-up CT next week. Hopefully we'll be getting her drain out soon. She needs to have biliary stent removal, patient desires having this done after her drain has been removed. Clinically she is doing much better. No signs of stent obstruction. Discussed warning signs with patient. Update lfts now.  She has a history of peptic ulcer disease, due for surveillance EGD at this time as well.  ERCP with biliary stent removal, EGD for peptic ulcer disease surveillance in the near future. Would plan for 2-3 weeks from now per patient's request. Unasyn 1.5g IV on call to procedures.  I have discussed the risks, alternatives, benefits with regards to but not limited to the risk of reaction to medication, bleeding, infection, perforation, specifically discussed a 1 in 10 chance of pancreatitis, possibility of a failed ERCP. Potential for sphincterotomy and stent placement also reviewed. Questions have been answered. All parties agreeable.

## 2015-05-31 NOTE — Progress Notes (Signed)
Please schedule in 2-3 weeks.  EGD for surveillance of PUD ERCP with biliary stent removal.  To be done with propofol.  Ancef 1.5 grams IV on call to procedure.

## 2015-06-01 ENCOUNTER — Other Ambulatory Visit (HOSPITAL_COMMUNITY): Payer: Self-pay | Admitting: Interventional Radiology

## 2015-06-01 ENCOUNTER — Telehealth: Payer: Self-pay

## 2015-06-01 ENCOUNTER — Other Ambulatory Visit: Payer: Self-pay

## 2015-06-01 ENCOUNTER — Ambulatory Visit (HOSPITAL_COMMUNITY)
Admission: RE | Admit: 2015-06-01 | Discharge: 2015-06-01 | Disposition: A | Discharge status: Home / Self Care | Payer: Medicare Other | Source: Ambulatory Visit | Attending: Interventional Radiology | Admitting: Interventional Radiology

## 2015-06-01 DIAGNOSIS — Z4803 Encounter for change or removal of drains: Secondary | ICD-10-CM | POA: Diagnosis not present

## 2015-06-01 DIAGNOSIS — Z4689 Encounter for fitting and adjustment of other specified devices: Secondary | ICD-10-CM

## 2015-06-01 DIAGNOSIS — T859XXA Unspecified complication of internal prosthetic device, implant and graft, initial encounter: Secondary | ICD-10-CM | POA: Diagnosis not present

## 2015-06-01 DIAGNOSIS — K750 Abscess of liver: Secondary | ICD-10-CM | POA: Insufficient documentation

## 2015-06-01 DIAGNOSIS — K279 Peptic ulcer, site unspecified, unspecified as acute or chronic, without hemorrhage or perforation: Secondary | ICD-10-CM

## 2015-06-01 MED ORDER — IOHEXOL 300 MG/ML  SOLN
50.0000 mL | Freq: Once | INTRAMUSCULAR | Status: AC | PRN
  Administered 2015-06-01: 5 mL

## 2015-06-01 NOTE — Telephone Encounter (Signed)
PER [facility] online CASE # [id number] was submitted for approval of EGD/ECP. Awaiting approval

## 2015-06-01 NOTE — Progress Notes (Signed)
Noted . Pt is aware and procedures are set for 06/26/2015. Mailed instructions to patient

## 2015-06-07 ENCOUNTER — Ambulatory Visit
Admission: RE | Admit: 2015-06-07 | Discharge: 2015-06-07 | Disposition: A | Discharge status: Home / Self Care | Payer: Medicare Other | Source: Ambulatory Visit | Attending: Family Medicine | Admitting: Family Medicine

## 2015-06-07 ENCOUNTER — Telehealth: Payer: Self-pay | Admitting: *Deleted

## 2015-06-07 ENCOUNTER — Other Ambulatory Visit (HOSPITAL_COMMUNITY): Payer: Self-pay | Admitting: Interventional Radiology

## 2015-06-07 ENCOUNTER — Other Ambulatory Visit: Payer: Self-pay | Admitting: Family Medicine

## 2015-06-07 DIAGNOSIS — K750 Abscess of liver: Secondary | ICD-10-CM

## 2015-06-07 MED ORDER — IOPAMIDOL (ISOVUE-300) INJECTION 61%
100.0000 mL | Freq: Once | INTRAVENOUS | Status: AC | PRN
  Administered 2015-06-07: 100 mL via INTRAVENOUS

## 2015-06-07 NOTE — Telephone Encounter (Signed)
Insurance is still pending 

## 2015-06-07 NOTE — Progress Notes (Signed)
Patient ID: Sheila [name], female   DOB: Apr 18, 1956, 60 y.o.   MRN: [id number]       Chief Complaint: Hepatic abscess, post percutaneous drainage catheter placement  Referring Physician(s): Hatcher (ID); Tana Coast (GI)  History of Present Illness: Sheila [name] is a 60 y.o. female who developed a hepatic abscess following an episode of severe complicated acute cholecystitis. Patient underwent a percutaneous hepatic abscess drainage catheter placement on 04/23/2015 by my partner Dr. Fredia Sorrow.  The patient was seen in interventional radiology clinic on 05/16/2015 with repeat abdominal CT demonstrating near complete resolution of previously drained dominant abscess within the dome of the right lobe of the liver. As such, the percutaneous drainage catheter was removed at that time.  Given persistence of ill-defined satellite lesion within the right lobe of the liver the decision was made to have the patient continue on intravenous antibiotics and returns to the interventional radiology drain clinic today for repeat abdominal CT.  The patient has completed her course of intravenous antibiotics, receiving her last administration yesterday. Patient is scheduled for removal of her internal biliary stent on March 6.  The patient denies fevers or chills though does admit to transient upper abdominal and epigastric abdominal pain which she says mimics the discomfort she experienced prior to the percutaneous drainage catheter placement.  Past Medical History  Diagnosis Date  . Myocardial infarction (HCC) 2012   . CVD (cardiovascular disease)     Stent to mid RCA 2012 [name] 2.5 X 12 mm  . Systemic lupus (HCC)     dx 1999. No further flares.   . Anxiety   . Hypertension   . Arthritis   . Hypercholesterolemia   . PUD (peptic ulcer disease)     Dr. in [place] in early 2016    Past Surgical History  Procedure Laterality Date  . Coronary angioplasty with stent placement  2012, 2013.    [name] DES to  mid RCA. Stents X 2 to unknown artery  . Abdominal hysterectomy    . Tubal ligation    . Breast lumpectomy    . Cholecystectomy N/A 01/27/2015    Procedure: CHOLECYSTECTOMY;  Surgeon: Franky Macho, MD;  Location: [facility];  Service: General;  Laterality: N/A;  converted to open at 0941  . Esophagogastroduodenoscopy      [place]:PUD per patient  . Colonoscopy  2008    [facility], normal.   . Ercp N/A 02/08/2015    [facility]:[id number] biliary leak/s/p stent placement  . Biliary stent placement N/A 02/08/2015    Procedure: BILIARY STENT PLACEMENT;  Surgeon: Corbin Ade, MD;  Location: [facility];  Service: Endoscopy;  Laterality: N/A;  . Sphincterotomy N/A 02/08/2015    Procedure: SPHINCTEROTOMY;  Surgeon: Corbin Ade, MD;  Location: [facility];  Service: Endoscopy;  Laterality: N/A;  . Esophagogastroduodenoscopy N/A 03/10/2015    [facility]:NFAOZHYQ large gastric ulcers in gastric antrum at the angularis and in the cardia/bile duct stent seen in the scond portion. esophageal stricture.     Allergies: Hydrocodone-acetaminophen  Medications: Prior to Admission medications   Medication Sig Start Date End Date Taking? Authorizing Provider  acetaminophen (TYLENOL 8 HOUR) 650 MG CR tablet Take 650 mg by mouth every 8 (eight) hours as needed for pain.   Yes Historical Provider, MD  amLODipine (NORVASC) 10 MG tablet Take 1 tablet (10 mg total) by mouth daily. 04/28/15  Yes Elliot Cousin, MD  cefTRIAXone 2 g in dextrose 5 % 50 mL Inject 2 g into the vein daily.  LAST DOSE TO BE GIVEN ON 05/16/2015 04/28/15  Yes Elliot Cousin, MD  cloNIDine (CATAPRES) 0.2 MG tablet Take 1.5 tablets (0.3 mg total) by mouth 2 (two) times daily. 04/28/15  Yes Elliot Cousin, MD  escitalopram (LEXAPRO) 10 MG tablet Take 10 mg by mouth daily.    Yes Historical Provider, MD  metoprolol (LOPRESSOR) 50 MG tablet Take 50 mg by mouth 3 (three) times daily.   Yes Historical Provider, MD  omeprazole (PRILOSEC) 40 MG capsule Take 40 mg by mouth daily.    Yes Historical Provider, MD  oxyCODONE-acetaminophen (PERCOCET) 7.5-325 MG tablet Take 1 tablet by mouth every 4 (four) hours as needed for severe pain. 04/28/15  Yes Elliot Cousin, MD  potassium chloride SA (K-DUR,KLOR-CON) 20 MEQ tablet Take 1 tablet (20 mEq total) by mouth 2 (two) times daily. 04/28/15  Yes Elliot Cousin, MD  sucralfate (CARAFATE) 1 G tablet Take 1 tablet (1 g total) by mouth 4 (four) times daily. 03/13/15  Yes Anice Paganini, NP  feeding supplement, ENSURE ENLIVE, (ENSURE ENLIVE) LIQD Take 237 mLs by mouth 2 (two) times daily between meals. Patient not taking: Reported on 06/07/2015 04/28/15   Elliot Cousin, MD  promethazine (PHENERGAN) 12.5 MG tablet Take 1 tablet (12.5 mg total) by mouth every 6 (six) hours as needed for nausea or vomiting. Patient not taking: Reported on 06/07/2015 04/28/15   Elliot Cousin, MD     Family History  Problem Relation Age of Onset  . CAD Father     CABG  . Heart attack Brother     PTCA  . Hypertension Sister   . Hypertension Father   . Colon cancer Neg Hx   . Liver disease Neg Hx     Social History   Social History  . Marital Status: Legally Separated    Spouse Name: N/A  . Number of Children: 2  . Years of Education: N/A   Occupational History  . Nutritionist    Social History Main Topics  . Smoking status: Former Smoker    Types: Cigarettes  . Smokeless tobacco: Never Used  . Alcohol Use: No  . Drug Use: No  . Sexual Activity: No   Other Topics Concern  . Not on file   Social History Narrative   Lives alone. Moved from [place] 2 years ago.     ECOG Status: 0 - Asymptomatic  Review of Systems: A 12 point ROS discussed and pertinent positives are indicated in the HPI above.  All other systems are negative.  Review of Systems  Vital Signs: BP 120/65 mmHg  Pulse 83  Temp(Src) 98.2 F (36.8 C) (Oral)  Resp 14  SpO2 94%  Physical Exam  Imaging: Ct Abdomen Pelvis W Contrast  06/07/2015  CLINICAL DATA:  History of  hepatic abscess, post percutaneous drainage catheter placement on 04/23/2015. Subsequent CT scan performed 05/16/2015 demonstrated near complete resolution of the percutaneously drained hepatic abscess with a small residual satellite abscess. Percutaneous drainage catheter was removed on 05/16/2015. Patient returns today for a follow-up CT scan. EXAM: CT ABDOMEN AND PELVIS WITH CONTRAST TECHNIQUE: Multidetector CT imaging of the abdomen and pelvis was performed using the standard protocol following bolus administration of intravenous contrast. CONTRAST:  100 cc Isovue-300 COMPARISON:  CT abdomen pelvis- 05/16/2015; 04/28/2015; 04/22/2015; 02/17/2015; CT-guided hepatic abscess drainage catheter placement -04/23/2015 FINDINGS: Normal hepatic contour. Interval removal of the prior percutaneous drainage catheter with minimal amount of residual hypoattenuation about the track of the prior percutaneous drainage catheter. Unfortunately, the  previously noted ill-defined hypo attenuating satellite abscess adjacent to the site of the prior percutaneous drainage catheter has minimally increased in size in interval, currently measuring 2.8 x 2.0 cm (image 14, series 3), previously, 1.3 x 1.4 cm. Additionally, there has been increased conspicuity of previously identified ill-defined now approximately 1.7 x 1.3 cm hypo attenuating area within the central aspect of the right lobe of the liver (image 20, series 3) which is worrisome for a poorly defined area of residual infection. Interval development of an ill-defined approximately 1.2 x 0.9 cm ill-defined hypo attenuating lesion within the caudal aspect the right lobe of the liver (coronal image 70, series 601) worrisome for a potentially new area of infection. Unchanged punctate (approximately 0.4 cm) hypoattenuating lesion within the medial segment of the left lobe of the liver (image 24, series 3), favored to represent a hepatic cyst. There is a minimal amount of focal fatty  infiltration adjacent to the fissure for ligamentum teres. Post cholecystectomy. Stable positioning of internal biliary stent. Unchanged very mild centralized intrahepatic biliary duct dilatation, most conspicuous within the left lobe of the liver. No ascites. There is symmetric enhancement of the bilateral kidneys. No definite renal stones. Left-sided punctate (approximately 3 mm) hypo attenuating lesion is within the superior pole the left kidney are too small to adequately characterize (coronal image 56, series 601). No discrete right-sided renal lesions. No urinary obstruction or perinephric stranding. Normal appearance of the bilateral adrenal glands, pancreas and spleen. Large colonic stool burden. The bowel is otherwise normal in course and caliber without wall thickening or evidence of obstruction. Normal appearance of the terminal ileum the appendix is not visualized, however there is no pericecal inflammatory change. No pneumoperitoneum, pneumatosis or portal venous gas. Large amount of mixed calcified and noncalcified atherosclerotic plaque within a normal caliber abdominal aorta. The major branch vessels of the abdominal aorta appear widely patent on this non CTA examination though there is extensive eccentric calcified plaque involving the origin of the celiac artery. No bulky retroperitoneal, mesenteric, pelvic or inguinal lymphadenopathy. Post hysterectomy. No discrete adnexal lesion. No free fluid the pelvic cul-de-sac. The urinary bladder is under distended but otherwise normal. Limited visualization of the lower thorax demonstrates minimal subsegmental atelectasis within the imaged bilateral lower lobes, right greater than left. No discrete focal airspace opacities. No pleural effusion. Normal heart size.  No pericardial effusion. No acute or aggressive osseous abnormalities mild scoliotic curvature of the thoracolumbar spine, potentially positional. Mild diffuse body wall anasarca. IMPRESSION: 1.  Interval removal of percutaneous hepatic abscess drain. Interval increase in size of dominant residual satellite abscess within the dome of the right lobe of the liver, currently measuring 2.8 cm, previously, 1.4 cm. Additionally, there has been increased conspicuity of an additional poorly defined abscess within the central aspect the right lobe of the liver, currently measuring 1.7 cm, as well interval development of a additional ill-defined area of suspected infection within the caudal aspect the subcapsular aspect the right lobe of the liver, measuring 1.2 cm. Note, none of these residual or developing ill-defined areas of infection currently are amenable to percutaneous drainage. 2. Post cholecystectomy.  Unchanged positioning of biliary stent. 3. Large colonic stool burden without evidence of enteric obstruction. 4. A large amount of mixed calcified and noncalcified atherosclerotic plaque within a normal caliber abdominal aorta. Critical Value/emergent results were called by telephone at the time of interpretation on 06/07/2015 at 9:14 am to Dr. Ninetta Lights, who verbally acknowledged these results. Electronically Signed   By:  Simonne Come M.D.   On: 06/07/2015 09:37   Ct Abdomen Pelvis W Contrast  05/16/2015  CLINICAL DATA:  60 year old female with a history of right hepatic abscess. Percutaneous drain placement on 04/23/2015. EXAM: CT ABDOMEN AND PELVIS WITH CONTRAST TECHNIQUE: Multidetector CT imaging of the abdomen and pelvis was performed using the standard protocol following bolus administration of intravenous contrast. CONTRAST:  ISOVUE-300 IOPAMIDOL (ISOVUE-300) INJECTION 61% COMPARISON:  Most recent prior CT abdomen/ pelvis 04/28/2015 FINDINGS: Lower Chest: Interval resolution of small bilateral pleural effusions and significant decrease in atelectasis. There is trace residual atelectasis versus scarring in the right lower lobe. Otherwise, visualized lung bases are clear. The tip of a PICC  terminates in the mid right atrium. The heart is normal in size. Calcifications present along the course of the coronary arteries. Trace pericardial fluid versus thickening slightly improved. Abdomen: Unremarkable CT appearance of the stomach, duodenum, adrenal glands and pancreas. A wedge-shaped low-attenuation area in the superior aspect of the spleen may represent a splenic infarct. Well-positioned abscess catheter without interval change in placement in the right hepatic lobe. The initial abscess collection has essentially completely resolved. The adjacent satellite fluid collection measures slightly smaller at 14 x 13 mm compared to approximately 20 x 17 mm previously. There is some persistent surrounding edema. A nonspecific low-attenuation region in the more inferior right hepatic lobe remains unchanged and nonspecific at 1.9 cm. No intra extrahepatic biliary ductal dilatation. The gallbladder is surgically absent. A plastic biliary stent is present. Unremarkable appearance of the bilateral kidneys. No focal solid lesion, hydronephrosis or nephrolithiasis. No focal bowel wall thickening or evidence of obstruction. Pelvis: Surgical changes of prior hysterectomy. Decompressed bladder. No free fluid or suspicious adenopathy. Bones/Soft Tissues: No acute fracture or aggressive appearing lytic or blastic osseous lesion. Vascular: Atherosclerotic vascular disease without significant stenosis or aneurysmal dilatation. IMPRESSION: 1. Near-total interval resolution of right hepatic abscess. The main abscess cavity has completely resolved. The small satellite abscess is decreased presently measuring up to 14 mm compared to 20 mm previously. 2. Persistent nonspecific low-attenuation region measuring 19 mm in the more inferior right hepatic lobe. Continued attention on follow-up imaging. 3. Additional ancillary findings as above without significant interval change. Signed, Sterling Big, MD [facility] Electronically Signed   By: Malachy Moan M.D.   On: 05/16/2015 17:02   Ir Sinus/fist Tube Chk-non Gi  06/01/2015  INDICATION: History of liver abscess. Evaluate drain for possible removal. Patient says there has been no drainage from the catheter. EXAM: ABSCESS DRAIN INJECTION COMPARISON:  Abdominal CT 05/16/2015 MEDICATIONS: None ANESTHESIA/SEDATION: None FLUOROSCOPY TIME:  24 seconds, 1 mGy COMPLICATIONS: None immediate. TECHNIQUE: Abscess drain was injected under fluoroscopy. FINDINGS: Contrast drains around the catheter and does not fill an abscess cavity. At least a portion of the catheter may be occluded. PROCEDURE: Patient was placed supine on the interventional table. The liver abscess drain was injected with contrast under fluoroscopy. Approximately 5 mL Omnipaque 300 was injected. Catheter was cut and removed at the end of the procedure. Bandage placed at the old drain site. IMPRESSION: At least a portion of the catheter was occluded. There was no filling of an abscess cavity. Therefore, the drainage catheter was removed. Consider a follow-up CT of the abdomen with IV contrast to ensure resolution of the liver abscess. Electronically Signed   By: Richarda Overlie M.D.   On: 06/01/2015 15:45    Labs:  CBC:  Recent Labs  05/02/15 1005 05/16/15 1437 05/23/15 1400 05/31/15 1113  WBC 7.7 6.9 7.7 6.1  HGB 10.1* 11.3* 10.6* 10.4*  HCT 32.1* 36.3 34.8* 34.6*  PLT 650* 467* 523* 461*    COAGS:  Recent Labs  04/23/15 0836  INR 1.21  APTT 37    BMP:  Recent Labs  04/25/15 0555 04/26/15 0557 04/27/15 0600 04/28/15 0622 05/02/15 1005 05/16/15 1437 05/23/15 1400 05/31/15 1113  NA 139 139 140 141  --   --   --   --   K 3.0* 3.0* 3.5 3.4*  --   --   --   --   CL 107 108 112* 108  --   --   --   --   CO2 23 22 20* 25  --   --   --   --   GLUCOSE 104* 113* 101* 104*  --   --   --   --   BUN 18 13 8 6 6 10 15 13   CALCIUM  8.0* 8.2* 8.5* 9.2  --   --   --   --   CREATININE 1.42* 1.01* 0.78 0.63 0.55 0.67 0.57 0.64  GFRNONAA 39* 59* >60 >60 >60 >60 >60 >60  GFRAA 45* >60 >60 >60 >60 >60 >60 >60    LIVER FUNCTION TESTS:  Recent Labs  03/09/15 0614 04/22/15 0929 04/23/15 0638 05/31/15 1113  BILITOT 0.5 0.8 0.7 0.3  AST 31 55* 31 21  ALT 27 48 35 15  ALKPHOS 95 137* 106 108  PROT 6.9 8.3* 6.4* 7.3  ALBUMIN 3.3* 3.4* 2.5* 3.6    Assessment and Plan:  Sheila [name] is a 60 y.o. female who developed a hepatic abscess following an episode of severe complicated acute cholecystitis.  The patient underwent a percutaneous hepatic abscess drainage catheter placement on 04/23/2015 by my partner Dr. Fredia Sorrow, subsequently removed on 05/16/2015.  Given persistence of ill-defined satellite lesion within the right lobe of the liver the decision was made to have the patient continue on intravenous antibiotics and returns to the interventional radiology drain clinic today for repeat abdominal CT.   Unfortunately review of today's abdominal CT demonstrates progression and development of several ill-defined areas of residual infection with dominant satellite poorly defined abscess within the dome of the right lobe the liver now measuring 2.8 cm, previously 1.4 cm, and development of an ill-defined approximately 1.2 cm area of infection within the caudal subcapsular aspect of the right lobe of the liver. None of the residual or developing areas of infection are currently amenable to percutaneous drainage.   These findings were discussed with referring infectious disease doctor, Dr. Ninetta Lights, the decision was made to continue the patient's course of IV antibiotics.  The patient is scheduled for a biliary stent removal on March 6 and was encouraged to keep all of her subsequent gastroenterology and infectious disease appointments.  The patient will return to the interventional radiology clinic in 3 weeks for IV only CT of the  abdomen.  The patient was instructed to call the interventional radiology clinic with any interval questions or concerns.  A copy of this report was sent to the requesting provider on this date.  Electronically Signed: Simonne Come 06/07/2015, 10:48 AM   I spent a total of 15 Minutes in face to face in clinical consultation, greater than 50% of which was counseling/coordinating care for hepatic abscess

## 2015-06-07 NOTE — Telephone Encounter (Signed)
Per Dr. Ninetta Lights, patient's IV antibiotics extended 2 weeks from today.  Verbal order given to Melissa at [facility]. Andree Coss, RN

## 2015-06-08 ENCOUNTER — Other Ambulatory Visit (HOSPITAL_COMMUNITY)
Admission: RE | Admit: 2015-06-08 | Discharge: 2015-06-08 | Disposition: A | Discharge status: Home / Self Care | Payer: Medicare Other | Source: Other Acute Inpatient Hospital | Attending: Infectious Diseases | Admitting: Infectious Diseases

## 2015-06-08 DIAGNOSIS — K750 Abscess of liver: Secondary | ICD-10-CM | POA: Insufficient documentation

## 2015-06-08 LAB — CBC WITH DIFFERENTIAL/PLATELET
BASOS ABS: 0 10*3/uL (ref 0.0–0.1)
BASOS PCT: 1 %
Eosinophils Absolute: 0.2 10*3/uL (ref 0.0–0.7)
Eosinophils Relative: 4 %
HEMATOCRIT: 28.2 % — AB (ref 36.0–46.0)
HEMOGLOBIN: 8.9 g/dL — AB (ref 12.0–15.0)
LYMPHS PCT: 46 %
Lymphs Abs: 2 10*3/uL (ref 0.7–4.0)
MCH: 28.7 pg (ref 26.0–34.0)
MCHC: 31.6 g/dL (ref 30.0–36.0)
MCV: 91 fL (ref 78.0–100.0)
MONO ABS: 0.4 10*3/uL (ref 0.1–1.0)
Monocytes Relative: 10 %
NEUTROS ABS: 1.7 10*3/uL (ref 1.7–7.7)
NEUTROS PCT: 39 %
Platelets: 328 10*3/uL (ref 150–400)
RBC: 3.1 MIL/uL — ABNORMAL LOW (ref 3.87–5.11)
RDW: 15 % (ref 11.5–15.5)
WBC: 4.3 10*3/uL (ref 4.0–10.5)

## 2015-06-08 LAB — BUN: BUN: 17 mg/dL (ref 6–20)

## 2015-06-08 LAB — CREATININE, SERUM: Creatinine, Ser: 0.7 mg/dL (ref 0.44–1.00)

## 2015-06-08 NOTE — Telephone Encounter (Signed)
Case approved  Pa number is [id number]

## 2015-06-12 ENCOUNTER — Telehealth: Payer: Self-pay | Admitting: *Deleted

## 2015-06-12 DIAGNOSIS — I10 Essential (primary) hypertension: Secondary | ICD-10-CM | POA: Diagnosis not present

## 2015-06-12 DIAGNOSIS — I951 Orthostatic hypotension: Secondary | ICD-10-CM | POA: Diagnosis not present

## 2015-06-12 DIAGNOSIS — E782 Mixed hyperlipidemia: Secondary | ICD-10-CM | POA: Diagnosis not present

## 2015-06-12 DIAGNOSIS — M328 Other forms of systemic lupus erythematosus: Secondary | ICD-10-CM | POA: Diagnosis not present

## 2015-06-12 NOTE — Telephone Encounter (Signed)
Please have her seen by her PCP, Dr Parke Simmers as soon as possible thanks

## 2015-06-12 NOTE — Telephone Encounter (Addendum)
Home Health called stating patient has a low hemoglobin of 8.9, which is lower than previously. Patient is c/o increased pain and after treatment just wanted to lie down. End date is the end of February; please advise. Wendall Mola

## 2015-06-12 NOTE — Telephone Encounter (Signed)
Dr. Ninetta Lights wants to pt to see her PCP, low HGB.  Faxed lab work to Dr. Lowella Bandy.

## 2015-06-12 NOTE — Telephone Encounter (Addendum)
Per Dr. Ninetta Lights regarding patient's low hemoglobin she needs to follow up with her primary care provider, Dr. Samule Dry. Tried to call Dr. Parke Simmers and [facility] and only got voice mail. Called Emergency contact after failing to reach patient at her number. Contact is in [place] and patient is new to the area. I will call him back in the AM if still unable to reach [place]. Wendall Mola

## 2015-06-13 ENCOUNTER — Other Ambulatory Visit (HOSPITAL_COMMUNITY)
Admission: RE | Admit: 2015-06-13 | Discharge: 2015-06-13 | Disposition: A | Discharge status: Home / Self Care | Payer: Medicare Other | Source: Ambulatory Visit | Attending: Infectious Diseases | Admitting: Infectious Diseases

## 2015-06-13 DIAGNOSIS — K750 Abscess of liver: Secondary | ICD-10-CM | POA: Insufficient documentation

## 2015-06-13 LAB — CBC
HCT: 34.4 % — ABNORMAL LOW (ref 36.0–46.0)
Hemoglobin: 10.8 g/dL — ABNORMAL LOW (ref 12.0–15.0)
MCH: 28.3 pg (ref 26.0–34.0)
MCHC: 31.4 g/dL (ref 30.0–36.0)
MCV: 90.3 fL (ref 78.0–100.0)
PLATELETS: 432 10*3/uL — AB (ref 150–400)
RBC: 3.81 MIL/uL — AB (ref 3.87–5.11)
RDW: 14.4 % (ref 11.5–15.5)
WBC: 5.1 10*3/uL (ref 4.0–10.5)

## 2015-06-13 LAB — DIFFERENTIAL
Basophils Absolute: 0 10*3/uL (ref 0.0–0.1)
Basophils Relative: 0 %
EOS ABS: 0.2 10*3/uL (ref 0.0–0.7)
EOS PCT: 3 %
LYMPHS PCT: 47 %
Lymphs Abs: 2.4 10*3/uL (ref 0.7–4.0)
Monocytes Absolute: 0.4 10*3/uL (ref 0.1–1.0)
Monocytes Relative: 8 %
NEUTROS PCT: 42 %
Neutro Abs: 2.1 10*3/uL (ref 1.7–7.7)

## 2015-06-13 LAB — COMPREHENSIVE METABOLIC PANEL
ALT: 15 U/L (ref 14–54)
AST: 22 U/L (ref 15–41)
Albumin: 3.7 g/dL (ref 3.5–5.0)
Alkaline Phosphatase: 128 U/L — ABNORMAL HIGH (ref 38–126)
Anion gap: 7 (ref 5–15)
BILIRUBIN TOTAL: 0.4 mg/dL (ref 0.3–1.2)
BUN: 11 mg/dL (ref 6–20)
CO2: 27 mmol/L (ref 22–32)
CREATININE: 0.5 mg/dL (ref 0.44–1.00)
Calcium: 9.5 mg/dL (ref 8.9–10.3)
Chloride: 106 mmol/L (ref 101–111)
Glucose, Bld: 116 mg/dL — ABNORMAL HIGH (ref 65–99)
POTASSIUM: 4.3 mmol/L (ref 3.5–5.1)
SODIUM: 140 mmol/L (ref 135–145)
TOTAL PROTEIN: 7.4 g/dL (ref 6.5–8.1)

## 2015-06-13 NOTE — Telephone Encounter (Signed)
New labs drawn 2/21, hemoglobin now = 10.8.  Placed lab results in Dr. Moshe Cipro box (also in EPIC.) Providence Lanius, Zachary George, RN

## 2015-06-13 NOTE — Telephone Encounter (Signed)
Spoke to [place], the son today and he gave his Dad's #, which I added to emergency contacts. Left a generic voice mail asking her to call for lab results. Wendall Mola CMA

## 2015-06-14 NOTE — Progress Notes (Signed)
Reviewed most recent labs in Epic. Lab Results  Component Value Date   ALT 15 06/13/2015   AST 22 06/13/2015   ALKPHOS 128* 06/13/2015   BILITOT 0.4 06/13/2015   Lab Results  Component Value Date   CREATININE 0.50 06/13/2015   BUN 11 06/13/2015   NA 140 06/13/2015   K 4.3 06/13/2015   CL 106 06/13/2015   CO2 27 06/13/2015   Lab Results  Component Value Date   WBC 5.1 06/13/2015   HGB 10.8* 06/13/2015   HCT 34.4* 06/13/2015   MCV 90.3 06/13/2015   PLT 432* 06/13/2015

## 2015-06-21 ENCOUNTER — Encounter (HOSPITAL_COMMUNITY): Payer: Self-pay

## 2015-06-21 ENCOUNTER — Telehealth: Payer: Self-pay | Admitting: *Deleted

## 2015-06-21 ENCOUNTER — Encounter (HOSPITAL_COMMUNITY)
Admission: RE | Admit: 2015-06-21 | Discharge: 2015-06-21 | Disposition: A | Discharge status: Home / Self Care | Payer: Medicare Other | Source: Ambulatory Visit | Attending: Internal Medicine | Admitting: Internal Medicine

## 2015-06-21 DIAGNOSIS — K750 Abscess of liver: Secondary | ICD-10-CM | POA: Diagnosis not present

## 2015-06-21 NOTE — Telephone Encounter (Signed)
Stop now, pull PIC please thanks

## 2015-06-21 NOTE — Telephone Encounter (Signed)
Notified [facility] pharmacy and RN. Thanks!

## 2015-06-21 NOTE — Telephone Encounter (Signed)
[facility] calling for orders - patient completed IV antibiotics 2/28 (though she still has 4 doses left). Please advise if 1) patient should finish those 4 doses or if she should stop now.  Please also advise on 2) PICC - ok to pull or should it be maintained? Andree Coss, RN

## 2015-06-22 DIAGNOSIS — K750 Abscess of liver: Secondary | ICD-10-CM | POA: Diagnosis not present

## 2015-06-23 ENCOUNTER — Other Ambulatory Visit: Payer: Self-pay

## 2015-06-23 NOTE — Progress Notes (Signed)
Noted Sheila [name] in Endo is aware

## 2015-06-23 NOTE — Progress Notes (Signed)
Please note, preop antibiotics unclear as outlined above. She is going to be receiving Unasyn 1.5 g on-call to her EGD/ERCP. Discussed with Candy, new orders provided.

## 2015-06-26 ENCOUNTER — Ambulatory Visit (HOSPITAL_COMMUNITY): Payer: Medicare Other | Admitting: Anesthesiology

## 2015-06-26 ENCOUNTER — Encounter (HOSPITAL_COMMUNITY)
Admission: RE | Disposition: A | Discharge status: Home / Self Care | Payer: Self-pay | Source: Ambulatory Visit | Attending: Internal Medicine

## 2015-06-26 ENCOUNTER — Ambulatory Visit (HOSPITAL_COMMUNITY)
Admission: RE | Admit: 2015-06-26 | Discharge: 2015-06-26 | Disposition: A | Discharge status: Home / Self Care | Payer: Medicare Other | Source: Ambulatory Visit | Attending: Internal Medicine | Admitting: Internal Medicine

## 2015-06-26 ENCOUNTER — Encounter (HOSPITAL_COMMUNITY): Payer: Self-pay | Admitting: Anesthesiology

## 2015-06-26 ENCOUNTER — Ambulatory Visit (HOSPITAL_COMMUNITY): Payer: Medicare Other

## 2015-06-26 DIAGNOSIS — I10 Essential (primary) hypertension: Secondary | ICD-10-CM | POA: Diagnosis not present

## 2015-06-26 DIAGNOSIS — Z4689 Encounter for fitting and adjustment of other specified devices: Secondary | ICD-10-CM | POA: Insufficient documentation

## 2015-06-26 DIAGNOSIS — I252 Old myocardial infarction: Secondary | ICD-10-CM | POA: Diagnosis not present

## 2015-06-26 DIAGNOSIS — E7800 Pure hypercholesterolemia, unspecified: Secondary | ICD-10-CM | POA: Diagnosis not present

## 2015-06-26 DIAGNOSIS — Z8719 Personal history of other diseases of the digestive system: Secondary | ICD-10-CM | POA: Diagnosis not present

## 2015-06-26 DIAGNOSIS — K279 Peptic ulcer, site unspecified, unspecified as acute or chronic, without hemorrhage or perforation: Secondary | ICD-10-CM | POA: Diagnosis not present

## 2015-06-26 DIAGNOSIS — I2510 Atherosclerotic heart disease of native coronary artery without angina pectoris: Secondary | ICD-10-CM | POA: Diagnosis not present

## 2015-06-26 DIAGNOSIS — Z4659 Encounter for fitting and adjustment of other gastrointestinal appliance and device: Secondary | ICD-10-CM | POA: Diagnosis not present

## 2015-06-26 DIAGNOSIS — Z8711 Personal history of peptic ulcer disease: Secondary | ICD-10-CM | POA: Insufficient documentation

## 2015-06-26 DIAGNOSIS — F419 Anxiety disorder, unspecified: Secondary | ICD-10-CM | POA: Insufficient documentation

## 2015-06-26 DIAGNOSIS — M1991 Primary osteoarthritis, unspecified site: Secondary | ICD-10-CM | POA: Diagnosis not present

## 2015-06-26 DIAGNOSIS — Z87891 Personal history of nicotine dependence: Secondary | ICD-10-CM | POA: Insufficient documentation

## 2015-06-26 DIAGNOSIS — Z13810 Encounter for screening for upper gastrointestinal disorder: Secondary | ICD-10-CM | POA: Diagnosis not present

## 2015-06-26 DIAGNOSIS — Z955 Presence of coronary angioplasty implant and graft: Secondary | ICD-10-CM | POA: Insufficient documentation

## 2015-06-26 DIAGNOSIS — K259 Gastric ulcer, unspecified as acute or chronic, without hemorrhage or perforation: Secondary | ICD-10-CM | POA: Diagnosis present

## 2015-06-26 DIAGNOSIS — K838 Other specified diseases of biliary tract: Secondary | ICD-10-CM | POA: Diagnosis not present

## 2015-06-26 HISTORY — PX: ESOPHAGOGASTRODUODENOSCOPY (EGD) WITH PROPOFOL: SHX5813

## 2015-06-26 HISTORY — PX: GASTROINTESTINAL STENT REMOVAL: SHX6384

## 2015-06-26 HISTORY — PX: ERCP: SHX5425

## 2015-06-26 SURGERY — ESOPHAGOGASTRODUODENOSCOPY (EGD) WITH PROPOFOL
Anesthesia: General | Site: Esophagus

## 2015-06-26 MED ORDER — LIDOCAINE HCL (PF) 1 % IJ SOLN
INTRAMUSCULAR | Status: AC
  Filled 2015-06-26: qty 2

## 2015-06-26 MED ORDER — GLUCAGON HCL RDNA (DIAGNOSTIC) 1 MG IJ SOLR
INTRAMUSCULAR | Status: AC
  Filled 2015-06-26: qty 2

## 2015-06-26 MED ORDER — GLYCOPYRROLATE 0.2 MG/ML IJ SOLN
INTRAMUSCULAR | Status: AC
  Filled 2015-06-26: qty 3

## 2015-06-26 MED ORDER — GLYCOPYRROLATE 0.2 MG/ML IJ SOLN
INTRAMUSCULAR | Status: DC | PRN
  Administered 2015-06-26: 0.6 mg via INTRAVENOUS

## 2015-06-26 MED ORDER — ROCURONIUM BROMIDE 100 MG/10ML IV SOLN
INTRAVENOUS | Status: DC | PRN
Start: 2015-06-26 — End: 2015-06-26
  Administered 2015-06-26: 25 mg via INTRAVENOUS

## 2015-06-26 MED ORDER — MIDAZOLAM HCL 2 MG/2ML IJ SOLN
INTRAMUSCULAR | Status: AC
  Filled 2015-06-26: qty 2

## 2015-06-26 MED ORDER — ONDANSETRON HCL 4 MG/2ML IJ SOLN: 4.0000 mg | Freq: Once | INTRAMUSCULAR | Status: DC | PRN

## 2015-06-26 MED ORDER — STERILE WATER FOR IRRIGATION IR SOLN
Status: DC | PRN
  Administered 2015-06-26: 1000 mL

## 2015-06-26 MED ORDER — FENTANYL CITRATE (PF) 100 MCG/2ML IJ SOLN: 25.0000 ug | INTRAMUSCULAR | Status: DC | PRN

## 2015-06-26 MED ORDER — SODIUM CHLORIDE 0.9 % IV SOLN
1.5000 g | Freq: Once | INTRAVENOUS | Status: AC
  Administered 2015-06-26: 1.5 g via INTRAVENOUS
  Filled 2015-06-26: qty 1.5

## 2015-06-26 MED ORDER — FENTANYL CITRATE (PF) 100 MCG/2ML IJ SOLN
INTRAMUSCULAR | Status: DC | PRN
  Administered 2015-06-26 (×2): 50 ug via INTRAVENOUS

## 2015-06-26 MED ORDER — PROPOFOL 10 MG/ML IV BOLUS
INTRAVENOUS | Status: AC
  Filled 2015-06-26: qty 20

## 2015-06-26 MED ORDER — ROCURONIUM BROMIDE 50 MG/5ML IV SOLN
INTRAVENOUS | Status: AC
  Filled 2015-06-26: qty 1

## 2015-06-26 MED ORDER — NEOSTIGMINE METHYLSULFATE 10 MG/10ML IV SOLN
INTRAVENOUS | Status: DC | PRN
  Administered 2015-06-26: 4 mg via INTRAVENOUS

## 2015-06-26 MED ORDER — GLYCOPYRROLATE 0.2 MG/ML IJ SOLN
INTRAMUSCULAR | Status: AC
  Filled 2015-06-26: qty 1

## 2015-06-26 MED ORDER — SUCCINYLCHOLINE CHLORIDE 20 MG/ML IJ SOLN
INTRAMUSCULAR | Status: AC
  Filled 2015-06-26: qty 1

## 2015-06-26 MED ORDER — LIDOCAINE HCL (CARDIAC) 20 MG/ML IV SOLN
INTRAVENOUS | Status: DC | PRN
  Administered 2015-06-26: 50 mg via INTRAVENOUS

## 2015-06-26 MED ORDER — MIDAZOLAM HCL 2 MG/2ML IJ SOLN
1.0000 mg | INTRAMUSCULAR | Status: DC | PRN
  Administered 2015-06-26: 2 mg via INTRAVENOUS

## 2015-06-26 MED ORDER — NEOSTIGMINE METHYLSULFATE 10 MG/10ML IV SOLN
INTRAVENOUS | Status: AC
  Filled 2015-06-26: qty 1

## 2015-06-26 MED ORDER — PROPOFOL 10 MG/ML IV BOLUS
INTRAVENOUS | Status: DC | PRN
  Administered 2015-06-26: 120 mg via INTRAVENOUS

## 2015-06-26 MED ORDER — FENTANYL CITRATE (PF) 100 MCG/2ML IJ SOLN
INTRAMUSCULAR | Status: AC
  Filled 2015-06-26: qty 2

## 2015-06-26 MED ORDER — LIDOCAINE HCL (PF) 1 % IJ SOLN
INTRAMUSCULAR | Status: AC
  Filled 2015-06-26: qty 5

## 2015-06-26 MED ORDER — LACTATED RINGERS IV SOLN
INTRAVENOUS | Status: DC
  Administered 2015-06-26 (×2): via INTRAVENOUS

## 2015-06-26 MED ORDER — SODIUM CHLORIDE 0.9 % IV SOLN
INTRAVENOUS | Status: DC | PRN
  Administered 2015-06-26: 100 mL

## 2015-06-26 MED ORDER — SODIUM CHLORIDE 0.9 % IV SOLN
INTRAVENOUS | Status: AC
  Filled 2015-06-26: qty 50

## 2015-06-26 NOTE — Anesthesia Preprocedure Evaluation (Signed)
Anesthesia Evaluation  Patient identified by MRN, date of birth, ID band Patient awake    Reviewed: Allergy & Precautions, NPO status , Patient's Chart, lab work & pertinent test results, reviewed documented beta blocker date and time   Airway Mallampati: II  TM Distance: >3 FB Neck ROM: Full    Dental  (+)    Pulmonary former smoker,    Pulmonary exam normal        Cardiovascular hypertension, Pt. on medications and Pt. on home beta blockers + CAD, + Past MI and + Cardiac Stents  Normal cardiovascular exam     Neuro/Psych Anxiety    GI/Hepatic PUD,   Endo/Other    Renal/GU      Musculoskeletal  (+) Arthritis , Osteoarthritis,    Abdominal Normal abdominal exam  (+)   Peds  Hematology  (+) anemia ,   Anesthesia Other Findings   Reproductive/Obstetrics                             Anesthesia Physical Anesthesia Plan  ASA: III  Anesthesia Plan: General   Post-op Pain Management:    Induction: Intravenous  Airway Management Planned: Oral ETT  Additional Equipment:   Intra-op Plan:   Post-operative Plan: Extubation in OR  Informed Consent: I have reviewed the patients History and Physical, chart, labs and discussed the procedure including the risks, benefits and alternatives for the proposed anesthesia with the patient or authorized representative who has indicated his/her understanding and acceptance.   Dental advisory given  Plan Discussed with: CRNA  Anesthesia Plan Comments:         Anesthesia Quick Evaluation

## 2015-06-26 NOTE — Transfer of Care (Signed)
Immediate Anesthesia Transfer of Care Note  Patient: Sheila [name]  Procedure(s) Performed: Procedure(s) with comments: ESOPHAGOGASTRODUODENOSCOPY (EGD) WITH PROPOFOL (N/A) ENDOSCOPIC RETROGRADE CHOLANGIOPANCREATOGRAPHY (ERCP) (N/A) GASTROINTESTINAL STENT REMOVAL (N/A) - Bilary stent removal  Patient Location: PACU  Anesthesia Type:General  Level of Consciousness: awake, alert , oriented and patient cooperative  Airway & Oxygen Therapy: Patient Spontanous Breathing and Patient connected to face mask oxygen  Post-op Assessment: Report given to RN and Post -op Vital signs reviewed and stable  Post vital signs: Reviewed and stable  Last Vitals:  Filed Vitals:   06/26/15 0930 06/26/15 0935  BP: 141/68 139/67  Temp:    Resp: 13 12    Complications: No apparent anesthesia complications

## 2015-06-26 NOTE — Interval H&P Note (Signed)
History and Physical Interval Note:  06/26/2015 9:31 AM  Sheila [name]  has presented today for surgery, with the diagnosis of Peptic ulcer disease, stent removal  The various methods of treatment have been discussed with the patient and family. After consideration of risks, benefits and other options for treatment, the patient has consented to  Procedure(s) with comments: ESOPHAGOGASTRODUODENOSCOPY (EGD) WITH PROPOFOL (N/A) - 0930 - to be done in OR ENDOSCOPIC RETROGRADE CHOLANGIOPANCREATOGRAPHY (ERCP) (N/A) GASTROINTESTINAL STENT REMOVAL (N/A) - Bilary stent removal as a surgical intervention .  The patient's history has been reviewed, patient examined, no change in status, stable for surgery.  I have reviewed the patient's chart and labs.  Questions were answered to the patient's satisfaction.     Sheila [name]  Patient seen and examined. Stent has been in for 5 months. Needs to be removed. Hopefully, leak has long since been sealed off. LFT still look good. She is at risk for biliary obstruction given longevity of the indwelling stent. I explained to patient how I would do a cholangiogram to document there is no leak. If, we happen to see extravasation, I will be forced to put another stent in. 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 We'll also reassess her stomach via EGD to check on previously noted gastric ulcers.The risks, benefits, limitations, alternatives, and mponderable have been reviewed with the patient. I specifically discussed a1 in 10 chance of pancreatitis, reaction to medications, bleeding, perforation and the possibility of a failed ERCP. Potential for sphincterotomy and stent placement also reviewed. Questions have been answered. All parties agreeable.

## 2015-06-26 NOTE — Discharge Instructions (Addendum)
Standard EGD and ERCP instructions provided  Gradually resume regular diet over the next 24 hours starting with a clear liquid lunch  Keep appointments with other physicians including Dr. Ninetta LightsHatcher  Avoid nonsteroidal agents like Aleve and Advil as much as possible going forward Esophagogastroduodenoscopy, Care After Refer to this sheet in the next few weeks. These instructions provide you with information about caring for yourself after your procedure. Your health care provider may also give you more specific instructions. Your treatment has been planned according to current medical practices, but problems sometimes occur. Call your health care provider if you have any problems or questions after your procedure. WHAT TO EXPECT AFTER THE PROCEDURE After your procedure, it is typical to feel:  Soreness in your throat.  Pain with swallowing.  Sick to your stomach (nauseous).  Bloated.  Dizzy.  Fatigued. HOME CARE INSTRUCTIONS  Do not eat or drink anything until the numbing medicine (local anesthetic) has worn off and your gag reflex has returned. You will know that the local anesthetic has worn off when you can swallow comfortably.  Do not drive or operate machinery until directed by your health care provider.  Take medicines only as directed by your health care provider. SEEK MEDICAL CARE IF:   You cannot stop coughing.  You are not urinating at all or less than usual. SEEK IMMEDIATE MEDICAL CARE IF:  You have difficulty swallowing.  You cannot eat or drink.  You have worsening throat or chest pain.  You have dizziness or lightheadedness or you faint.  You have nausea or vomiting.  You have chills.  You have a fever.  You have severe abdominal pain.  You have black, tarry, or bloody stools.   This information is not intended to replace advice given to you by your health care provider. Make sure you discuss any questions you have with your health care provider.     Endoscopic Retrograde Cholangiopancreatography (ERCP), Care After Refer to this sheet in the next few weeks. These instructions provide you with information on caring for yourself after your procedure. Your health care provider may also give you more specific instructions. Your treatment has been planned according to current medical practices, but problems sometimes occur. Call your health care provider if you have any problems or questions after your procedure.  WHAT TO EXPECT AFTER THE PROCEDURE  After your procedure, it is typical to feel:  Soreness in your throat.  Sick to your stomach (nauseous).  Bloated. Dizzy.  Fatigued. HOME CARE INSTRUCTIONS Have a friend or family member stay with you for the first 24 hours after your procedure. Start taking your usual medicines and eating normally as soon as you feel well enough to do so or as directed by your health care provider. SEEK MEDICAL CARE IF: You have abdominal pain.  You develop signs of infection, such as:  Chills.  Feeling unwell.  SEEK IMMEDIATE MEDICAL CARE IF: You have difficulty swallowing. You have worsening throat, chest, or abdominal pain. You vomit. You have bloody or very black stools. You have a fever.   This information is not intended to replace advice given to you by your health care provider. Make sure you discuss any questions you have with your health care provider.   Document Released: 01/27/2013 Document Reviewed: 01/27/2013 Elsevier Interactive Patient Education [employer]2016 Elsevier Inc.

## 2015-06-26 NOTE — Anesthesia Procedure Notes (Signed)
Procedure Name: Intubation Date/Time: 06/26/2015 10:00 AM Performed by: Pernell DupreADAMS, AMY A Pre-anesthesia Checklist: Patient identified, Patient being monitored, Timeout performed, Emergency Drugs available and Suction available Patient Re-evaluated:Patient Re-evaluated prior to inductionOxygen Delivery Method: Circle System Utilized Preoxygenation: Pre-oxygenation with 100% oxygen Intubation Type: IV induction Ventilation: Mask ventilation without difficulty Laryngoscope Size: 3 and Miller Grade View: Grade I Tube type: Oral Tube size: 7.0 mm Number of attempts: 1 Airway Equipment and Method: Stylet Placement Confirmation: ETT inserted through vocal cords under direct vision,  positive ETCO2 and breath sounds checked- equal and bilateral Secured at: 21 cm Tube secured with: Tape Dental Injury: Teeth and Oropharynx as per pre-operative assessment

## 2015-06-26 NOTE — Op Note (Signed)
ERCP PROCEDURE REPORT  PATIENT:  Sheila [name]  MR#:  [id number] Birthdate:  1955-09-18, 60 y.o., female Endoscopist:  R. Roetta SessionsMichael Chae Shuster, MD FACP [facility] Referred By:  Dr. Lovell SheehanJenkins Etta Quill/Bland/ Hatcher  Procedure Date: 06/26/2015  Procedure:   Surveillance EGD followed by ERCP with stent removal    Indications:  Patient status post cholecystectomy last October, complicated by cystic duct stump leak. Patient underwent ERCP with stent placement. Leak slow to close. Clinical course further complicated by sub-hepatic abscess.  LFTs have normalized. Percutaneous drain previously removed. Stent has now been in a good 5 months. Time for removal.    EGD in November demonstrated multiple gastric ulcers-biopsies negative for malignancy and H pylori. She is here for follow-up EGD as well as ERCP.           Informed Consent:  The risks, benefits, limitations, alternatives, and imponderable have been reviewed with the patient. I specifically discussed a 1 in 10 chance of pancreatitis, reaction to medications, bleeding, perforation and the possibility of a failed ERCP. Potential for sphincterotomy and stent placement also reviewed. Questions have been answered. All parties agreeable.  Please see history & physical in medical record for more information.  Medications:  General endotracheal anesthesia was induced by Benanc[employer] Deeds[employer]some and associates.  Please see anesthesia record for complete details. Patient given Unasyn 1.5 g IV preprocedure  Instrument: Pentax video chip system duodenoscope.   Procedure performed in the OR. The patient was placed under anesthesia, intubated, and turned into semipermanent position. Therapeutic Pentax video duodenoscope passed through the oropharynx without any difficulty into the esophagus, stomach, and across the pylorus down into the second portion of the duodenum.   Findings:  EGD:  Examination esophagus revealed no abnormalities. Stomach with minimal gastric liquid easily suctioned out. Scar  in the antrum/angularis indicative of prior peptic ulcer disease. Previously noted ulcers healed. Gastric mucosa otherwise appeared normal. Patent pylorus. Examination of the first and second portion of the duodenum revealed a previously placed biliary stent protruding through the ampullary orifice. Gastroscope was withdrawn and the duodenum scope was obtained. ERCP: The scope was easily passed into the second portion of the duodenum The ampulla was identified on the medial wall of the second portion of the duodenum. Evidence of prior sphincterotomy and indwelling, weathered-appearing stent protruding through the ampullary orifice. The scope was pulled back to the short position, 55cm from the incisors. A scout film was taken. Utilizing the snare through the scope, the stent was removed intact. The side ports and end ports appeared totally occluded. The scope was reproduced into the duodenum using the 44 autotome;  I easily achieved deep biliary cannulation. A cholangiogram was performed. There was mild dilation of the biliary tree.  No filling defects noted. Air bubbles were almost instantaneously introduced into the biliary tree given the presence of a patent sphincterotomy.  I did not see any extravasation of contrast.The biliary tree appeared to drain very well.  Pancreatic duct was not injected or manipulated.   The patient tolerated the procedure well.   There were no immediate complications associated with the procedure.  Impression:  EGD revealed complete healing of previously noted gastric ulcer with some scar formation. ERCP:  Occluded biliary stent removed. Very mild diffuse dilation of the biliary tree without filling defect or residual biliary leak seen.  Recommendations:   Discharge a little later today if she does well in PACU. As discussed with the patient at length prior to the procedure, she has persisting and possibly worsening inflammatory/infectious  changes outside the biliary tree on  cross-sectional imaging. She is urged to follow-up with her other physicians including Dr. Ninetta Lights.   Eula Listen  06/26/2015  10:49 AM  CC: Dr. [name], MD & Dr. Bonnetta Barry ref. provider found

## 2015-06-26 NOTE — Anesthesia Postprocedure Evaluation (Signed)
Anesthesia Post Note  Patient: [name] Berling  Procedure(s) Performed: Procedure(s) (LRB): ESOPHAGOGASTRODUODENOSCOPY (EGD) WITH PROPOFOL (N/A) ENDOSCOPIC RETROGRADE CHOLANGIOPANCREATOGRAPHY (ERCP) (N/A) GASTROINTESTINAL STENT REMOVAL (N/A)  Patient location during evaluation: PACU Anesthesia Type: General Level of consciousness: awake and alert and oriented Pain management: pain level controlled Vital Signs Assessment: post-procedure vital signs reviewed and stable Respiratory status: spontaneous breathing and respiratory function stable Cardiovascular status: stable Postop Assessment: no signs of nausea or vomiting Anesthetic complications: no    Last Vitals:  Filed Vitals:   06/26/15 1119 06/26/15 1130  BP:  138/76  Pulse: 60   Temp:    Resp: 15 11    Last Pain:  Filed Vitals:   06/26/15 1146  PainSc: 0-No pain                 Yovanni Frenette A

## 2015-06-26 NOTE — H&P (View-Only) (Signed)
Primary Care Physician: Geraldo PitterBLAND,VEITA J, MD  Primary Gastroenterologist:  Roetta SessionsMichael Rourk, MD   Chief Complaint  Patient presents with  . Follow-up    HPI: Sheila [name] is a 60 y.o. female here for follow up for consideration of EGD for peptic ulcer disease surveillance and ERCP with biliary stent removal. She had ERCP with sphincterotomy and stent placement back in October when she presented with biliary leak postoperatively. Required EGD in November for vomiting and epigastric pain and found to have multiple large gastric ulcers, bile duct stent seen in the second portion of the duodenum. Unfortunately she continued to have difficulty and ultimately required percutaneous catheter placement for right lobe hepatic abscess on 04/23/2015. She has been on IV antibiotics via PICC line. Her last imaging was on January 24 initial abscess collection essentially completely resolved. The adjacent satellite fluid collection measures slightly smaller at 14 x 13 mm compared to 20 x 17 mm. Nonspecific low attenuation region in the more inferior right hepatic lobe remains unchanged and nonspecific at 1.9 cm. Plastic biliary stent noted.  Patient tells me she takes her last dose of antibiotics on February 13 and should be having a follow-up CT a couple days later. She would like to postpone biliary stent removal until after this is been done.  Overall patient states she feels better each day. No significant abdominal pain as long as she eats multiple small meals daily. No heartburn. Bowel movements are regular. No blood in the stool or melena. No fever.   Current Outpatient Prescriptions  Medication Sig Dispense Refill  . acetaminophen (TYLENOL 8 HOUR) 650 MG CR tablet Take 650 mg by mouth every 8 (eight) hours as needed for pain.    Marland Kitchen. amLODipine (NORVASC) 10 MG tablet Take 1 tablet (10 mg total) by mouth daily. 30 tablet 6  . cefTRIAXone 2 g in dextrose 5 % 50 mL Inject 2 g into the vein daily. LAST  DOSE TO BE GIVEN ON 05/16/2015    . cloNIDine (CATAPRES) 0.2 MG tablet Take 1.5 tablets (0.3 mg total) by mouth 2 (two) times daily. 90 tablet 6  . escitalopram (LEXAPRO) 10 MG tablet Take 10 mg by mouth daily.     . feeding supplement, ENSURE ENLIVE, (ENSURE ENLIVE) LIQD Take 237 mLs by mouth 2 (two) times daily between meals.    . metoprolol (LOPRESSOR) 50 MG tablet Take 50 mg by mouth 3 (three) times daily.    Marland Kitchen. omeprazole (PRILOSEC) 40 MG capsule Take 40 mg by mouth daily.    Marland Kitchen. oxyCODONE-acetaminophen (PERCOCET) 7.5-325 MG tablet Take 1 tablet by mouth every 4 (four) hours as needed for severe pain. 30 tablet 0  . potassium chloride SA (K-DUR,KLOR-CON) 20 MEQ tablet Take 1 tablet (20 mEq total) by mouth 2 (two) times daily. 30 tablet 0  . promethazine (PHENERGAN) 12.5 MG tablet Take 1 tablet (12.5 mg total) by mouth every 6 (six) hours as needed for nausea or vomiting. 30 tablet 0  . sucralfate (CARAFATE) 1 G tablet Take 1 tablet (1 g total) by mouth 4 (four) times daily. 42 tablet 0   No current facility-administered medications for this visit.    Allergies as of 05/30/2015 - Review Complete 05/30/2015  Allergen Reaction Noted  . Hydrocodone-acetaminophen Nausea And Vomiting 02/17/2015   Past Medical History  Diagnosis Date  . Myocardial infarction (HCC) 2012   . CVD (cardiovascular disease)     Stent to mid RCA 2012 [name] 2.5 X 12 mm  .  Systemic lupus (HCC)     dx 1999. No further flares.   . Anxiety   . Hypertension   . Arthritis   . Hypercholesterolemia   . PUD (peptic ulcer disease)     Dr. in [place] in early 2016   Past Surgical History  Procedure Laterality Date  . Coronary angioplasty with stent placement  2012, 2013.    [name] DES to mid RCA. Stents X 2 to unknown artery  . Abdominal hysterectomy    . Tubal ligation    . Breast lumpectomy    . Cholecystectomy N/A 01/27/2015    Procedure: CHOLECYSTECTOMY;  Surgeon: Franky Macho, MD;  Location: [facility];  Service:  General;  Laterality: N/A;  converted to open at 0941  . Esophagogastroduodenoscopy      [place]:PUD per patient  . Colonoscopy  2008    [facility], normal.   . Ercp N/A 02/08/2015    [id number] biliary leak/s/p stent placement  . Biliary stent placement N/A 02/08/2015    Procedure: BILIARY STENT PLACEMENT;  Surgeon: Corbin Ade, MD;  Location: [facility];  Service: Endoscopy;  Laterality: N/A;  . Sphincterotomy N/A 02/08/2015    Procedure: SPHINCTEROTOMY;  Surgeon: Corbin Ade, MD;  Location: [facility];  Service: Endoscopy;  Laterality: N/A;  . Esophagogastroduodenoscopy N/A 03/10/2015    [id number] large gastric ulcers in gastric antrum at the angularis and in the cardia/bile duct stent seen in the scond portion. esophageal stricture.    Family History  Problem Relation Age of Onset  . CAD Father     CABG  . Heart attack Brother     PTCA  . Hypertension Sister   . Hypertension Father   . Colon cancer Neg Hx   . Liver disease Neg Hx    Social History   Social History  . Marital Status: Legally Separated    Spouse Name: N/A  . Number of Children: 2  . Years of Education: N/A   Occupational History  . Nutritionist    Social History Main Topics  . Smoking status: Former Smoker    Types: Cigarettes  . Smokeless tobacco: Never Used  . Alcohol Use: No  . Drug Use: No  . Sexual Activity: No   Other Topics Concern  . None   Social History Narrative   Lives alone. Moved from [place] 2 years ago.     ROS:  General: Negative for anorexia, weight loss, fever, chills, fatigue, weakness. ENT: Negative for hoarseness, difficulty swallowing , nasal congestion. CV: Negative for chest pain, angina, palpitations, dyspnea on exertion, peripheral edema.  Respiratory: Negative for dyspnea at rest, dyspnea on exertion, cough, sputum, wheezing.  GI: See history of present illness. GU:  Negative for dysuria, hematuria, urinary incontinence, urinary frequency, nocturnal urination.   Endo: Negative for unusual weight change.    Physical Examination:   BP 118/78 mmHg  Pulse 75  Temp(Src) 98.6 F (37 C) (Oral)  Ht  (1.549 m)  Wt 122 lb 6 oz (55.509 kg)  BMI 23.13 kg/m2  General: Well-nourished, well-developed in no acute distress.  Eyes: No icterus. Mouth: Oropharyngeal mucosa moist and pink , no lesions erythema or exudate. Lungs: Clear to auscultation bilaterally.  Heart: Regular rate and rhythm, no murmurs rubs or gallops.  Abdomen: Bowel sounds are normal, nontender, nondistended, no hepatosplenomegaly or masses, no abdominal bruits or hernia , no rebound or guarding.  Right upper quadrant percutaneous drain noted. Extremities: No lower extremity edema. No clubbing or deformities. Neuro:  Alert and oriented x 4   Skin: Warm and dry, no jaundice.   Psych: Alert and cooperative, normal mood and affect.  Labs:  Labs from January 2017 noted.    Imaging Studies: Ct Abdomen Pelvis W Contrast  05/16/2015  CLINICAL DATA:  60 year old female with a history of right hepatic abscess. Percutaneous drain placement on 04/23/2015. EXAM: CT ABDOMEN AND PELVIS WITH CONTRAST TECHNIQUE: Multidetector CT imaging of the abdomen and pelvis was performed using the standard protocol following bolus administration of intravenous contrast. CONTRAST:  ISOVUE-300 IOPAMIDOL (ISOVUE-300) INJECTION 61% COMPARISON:  Most recent prior CT abdomen/ pelvis 04/28/2015 FINDINGS: Lower Chest: Interval resolution of small bilateral pleural effusions and significant decrease in atelectasis. There is trace residual atelectasis versus scarring in the right lower lobe. Otherwise, visualized lung bases are clear. The tip of a PICC terminates in the mid right atrium. The heart is normal in size. Calcifications present along the course of the coronary arteries. Trace pericardial fluid versus thickening slightly improved. Abdomen: Unremarkable CT appearance of the stomach, duodenum, adrenal glands  and pancreas. A wedge-shaped low-attenuation area in the superior aspect of the spleen may represent a splenic infarct. Well-positioned abscess catheter without interval change in placement in the right hepatic lobe. The initial abscess collection has essentially completely resolved. The adjacent satellite fluid collection measures slightly smaller at 14 x 13 mm compared to approximately 20 x 17 mm previously. There is some persistent surrounding edema. A nonspecific low-attenuation region in the more inferior right hepatic lobe remains unchanged and nonspecific at 1.9 cm. No intra extrahepatic biliary ductal dilatation. The gallbladder is surgically absent. A plastic biliary stent is present. Unremarkable appearance of the bilateral kidneys. No focal solid lesion, hydronephrosis or nephrolithiasis. No focal bowel wall thickening or evidence of obstruction. Pelvis: Surgical changes of prior hysterectomy. Decompressed bladder. No free fluid or suspicious adenopathy. Bones/Soft Tissues: No acute fracture or aggressive appearing lytic or blastic osseous lesion. Vascular: Atherosclerotic vascular disease without significant stenosis or aneurysmal dilatation. IMPRESSION: 1. Near-total interval resolution of right hepatic abscess. The main abscess cavity has completely resolved. The small satellite abscess is decreased presently measuring up to 14 mm compared to 20 mm previously. 2. Persistent nonspecific low-attenuation region measuring 19 mm in the more inferior right hepatic lobe. Continued attention on follow-up imaging. 3. Additional ancillary findings as above without significant interval change. Signed, Sterling Big, MD [facility] Electronically Signed   By: Malachy Moan M.D.   On: 05/16/2015 17:02

## 2015-06-28 ENCOUNTER — Other Ambulatory Visit: Payer: Medicare Other

## 2015-06-28 ENCOUNTER — Inpatient Hospital Stay: Admission: RE | Admit: 2015-06-28 | Payer: Medicare Other | Source: Ambulatory Visit

## 2015-06-30 ENCOUNTER — Encounter (HOSPITAL_COMMUNITY): Payer: Self-pay | Admitting: Internal Medicine

## 2015-07-06 ENCOUNTER — Encounter: Payer: Self-pay | Admitting: Infectious Diseases

## 2015-07-11 ENCOUNTER — Telehealth: Payer: Self-pay | Admitting: Internal Medicine

## 2015-07-11 DIAGNOSIS — R109 Unspecified abdominal pain: Secondary | ICD-10-CM

## 2015-07-11 NOTE — Telephone Encounter (Signed)
[phone]  PLEASE CALL PATIENT, SHE HAS QUESTIONS ABOUT HER PROCEDURE FOLLOW UP    IS ALSO HAVING SOME DISCOMFORT

## 2015-07-12 ENCOUNTER — Other Ambulatory Visit: Payer: Self-pay

## 2015-07-12 ENCOUNTER — Other Ambulatory Visit: Payer: Self-pay | Admitting: Internal Medicine

## 2015-07-12 ENCOUNTER — Telehealth: Payer: Self-pay | Admitting: Internal Medicine

## 2015-07-12 DIAGNOSIS — R109 Unspecified abdominal pain: Secondary | ICD-10-CM

## 2015-07-12 NOTE — Addendum Note (Signed)
Addended by: Myra RudeLAWSON, Aniqa Hare H on: 07/12/2015 02:07 PM   Modules accepted: Orders

## 2015-07-12 NOTE — Telephone Encounter (Signed)
There is a refill for omeprazole in the refill box.

## 2015-07-12 NOTE — Telephone Encounter (Signed)
Pt said we should be getting something from her pharmacy [facility]) about getting a refill on her omperazole.

## 2015-07-12 NOTE — Telephone Encounter (Signed)
Spoke with the pt, she wanted to let RMR know that the only problem she is having is after she eats, she gets a pain on her Left side and gas that lasts for about 10 minutes and then it goes away. She said the pain is not as intense as it was before the stent and is there anything she needed to do about it?   Also pt was wanting to know if she needed to follow up with us?

## 2015-07-12 NOTE — Telephone Encounter (Signed)
Pt is aware. Lab order done and sent to the lab.

## 2015-07-12 NOTE — Telephone Encounter (Signed)
Let's get a hepatic function profile now; further recommendations to follow.

## 2015-07-12 NOTE — Telephone Encounter (Signed)
Tried to call pt- NA and mailbox was full. 

## 2015-07-13 LAB — HEPATIC FUNCTION PANEL
ALBUMIN: 4.1 g/dL (ref 3.6–5.1)
ALT: 8 U/L (ref 6–29)
AST: 14 U/L (ref 10–35)
Alkaline Phosphatase: 120 U/L (ref 33–130)
Bilirubin, Direct: <0.1 mg/dL (ref <=0.2)
TOTAL PROTEIN: 7.6 g/dL (ref 6.1–8.1)
Total Bilirubin: 0.4 mg/dL (ref 0.2–1.2)

## 2015-07-25 ENCOUNTER — Ambulatory Visit (INDEPENDENT_AMBULATORY_CARE_PROVIDER_SITE_OTHER): Payer: Medicare Other | Admitting: Infectious Diseases

## 2015-07-25 ENCOUNTER — Encounter: Payer: Self-pay | Admitting: Infectious Diseases

## 2015-07-25 DIAGNOSIS — K750 Abscess of liver: Secondary | ICD-10-CM | POA: Diagnosis not present

## 2015-07-25 NOTE — Progress Notes (Signed)
   Subjective:    Patient ID: Sheila [name], female    DOB: 06-05-1955, 60 y.o.   MRN: [id number]  HPI 60 yo F with hx of PUD, cholecystectomy 01/2015 with postop bile leak, presented 04-22-15 with subjective fever, chills, abd pain. CT ab/pelvis revealed liver abscess.She was started on zosyn.  She was taken to IR on 1-1 and a drain was placed (Cx K oxytoca- I unasyn, R- anc/cef). She did well and was able to be d/c home on 1-6 with IV ceftriaxone with a planned stop date 1-24.  She still has PIC. Was seen at IR yesterday and had her anbx extended 2-3 weeks after repeat CT showed: 1. Near-total interval resolution of right hepatic abscess. The main abscess cavity has completely resolved. The small satellite abscess is decreased presently measuring up to 14 mm compared to 20 mm previously. 2. Persistent nonspecific low-attenuation region measuring 19 mm in the more inferior right hepatic lobe. Continued attention on follow-up imaging. 3. Additional ancillary findings as above without significant interval change. She was seen in ID on 1-26 and was to f/u with IR, prn f/u with ID.  She had her drain removed in mid-February. She had f/u CT scan showing: 1. Interval removal of percutaneous hepatic abscess drain. Interval increase in size of dominant residual satellite abscess within the dome of the right lobe of the liver, currently measuring 2.8 cm, previously, 1.4 cm. Additionally, there has been increased conspicuity of an additional poorly defined abscess within the central aspect the right lobe of the liver, currently measuring 1.7 cm, as well interval development of a additional ill-defined area of suspected infection within the caudal aspect the subcapsular aspect the right lobe of the liver, measuring 1.2 cm. Note, none of these residual or developing ill-defined areas of infection currently are amenable to percutaneous drainage. 2. Post cholecystectomy. Unchanged positioning of  biliary stent. 3. Large colonic stool burden without evidence of enteric obstruction. 4. A large amount of mixed calcified and noncalcified atherosclerotic plaque within a normal caliber abdominal aorta. Critical Value/emergent results were called by telephone at the time of interpretation on 06/07/2015 at 9:14 am to Dr. Ninetta LightsHatcher, who verbally acknowledged these results. She completed her anbx on the first of March.  Today she complains of mild discomfort in upper abd. Feels like she has had low grade fevers (99.9). Has been eating well, wt is starting to come back (up 9#).   Has some abd discomfort with eating.   Review of Systems  Constitutional: Negative for fever, chills, appetite change and unexpected weight change.  Gastrointestinal: Positive for constipation. Negative for diarrhea.  Genitourinary: Negative for difficulty urinating.       Objective:   Physical Exam  Constitutional: She appears well-developed and well-nourished.  HENT:  Mouth/Throat: No oropharyngeal exudate.  Eyes: EOM are normal. Pupils are equal, round, and reactive to light.  Neck: Neck supple.  Cardiovascular: Normal rate, regular rhythm and normal heart sounds.   Pulmonary/Chest: Effort normal and breath sounds normal.  Abdominal: Soft. Bowel sounds are normal. She exhibits no distension and no mass. There is no tenderness. There is no rebound and no guarding.  Lymphadenopathy:    She has no cervical adenopathy.      Assessment & Plan:

## 2015-07-25 NOTE — Assessment & Plan Note (Signed)
Pt is concerned about her previous abscess, her continued abd discomfort with eating, and low grade temps.  We will repeat her CT scan and have her back in 2-3 weeks.  Not resume anbx at this point.

## 2015-08-16 ENCOUNTER — Ambulatory Visit: Payer: Medicare Other | Admitting: Infectious Diseases

## 2015-09-11 DIAGNOSIS — M130 Polyarthritis, unspecified: Secondary | ICD-10-CM | POA: Diagnosis not present

## 2015-09-11 DIAGNOSIS — M328 Other forms of systemic lupus erythematosus: Secondary | ICD-10-CM | POA: Diagnosis not present

## 2015-09-11 DIAGNOSIS — I519 Heart disease, unspecified: Secondary | ICD-10-CM | POA: Diagnosis not present

## 2015-10-11 DIAGNOSIS — E782 Mixed hyperlipidemia: Secondary | ICD-10-CM | POA: Diagnosis not present

## 2015-10-11 DIAGNOSIS — I10 Essential (primary) hypertension: Secondary | ICD-10-CM | POA: Diagnosis not present

## 2015-11-15 DIAGNOSIS — E782 Mixed hyperlipidemia: Secondary | ICD-10-CM | POA: Diagnosis not present

## 2015-11-15 DIAGNOSIS — I519 Heart disease, unspecified: Secondary | ICD-10-CM | POA: Diagnosis not present

## 2015-11-15 DIAGNOSIS — I10 Essential (primary) hypertension: Secondary | ICD-10-CM | POA: Diagnosis not present

## 2015-12-28 ENCOUNTER — Encounter (HOSPITAL_COMMUNITY): Payer: Self-pay | Admitting: Emergency Medicine

## 2015-12-28 ENCOUNTER — Emergency Department (HOSPITAL_COMMUNITY)
Admission: EM | Admit: 2015-12-28 | Discharge: 2015-12-28 | Disposition: A | Discharge status: Home / Self Care | Payer: Medicare Other | Attending: Emergency Medicine | Admitting: Emergency Medicine

## 2015-12-28 ENCOUNTER — Emergency Department (HOSPITAL_COMMUNITY): Payer: Medicare Other

## 2015-12-28 DIAGNOSIS — Z87891 Personal history of nicotine dependence: Secondary | ICD-10-CM | POA: Diagnosis not present

## 2015-12-28 DIAGNOSIS — Z5321 Procedure and treatment not carried out due to patient leaving prior to being seen by health care provider: Secondary | ICD-10-CM | POA: Diagnosis not present

## 2015-12-28 DIAGNOSIS — R51 Headache: Secondary | ICD-10-CM | POA: Diagnosis not present

## 2015-12-28 DIAGNOSIS — Z79899 Other long term (current) drug therapy: Secondary | ICD-10-CM | POA: Insufficient documentation

## 2015-12-28 DIAGNOSIS — I10 Essential (primary) hypertension: Secondary | ICD-10-CM | POA: Diagnosis not present

## 2015-12-28 DIAGNOSIS — R0602 Shortness of breath: Secondary | ICD-10-CM | POA: Insufficient documentation

## 2015-12-28 NOTE — ED Notes (Signed)
Pt called from triage x1 with no answer. PT not currently sitting in waiting room.

## 2015-12-28 NOTE — ED Notes (Signed)
Pt not found in waiting area or outside. 

## 2015-12-28 NOTE — ED Triage Notes (Signed)
PT states she had a headache yesterday. PT states she checks her B/P every morning and when she checked it at home it was 180/107 and her PCP told her to come to ED for evaluation. PT also c/o SOB since this am with no cough at rest and nausea. PT denies any CP.

## 2016-02-12 DIAGNOSIS — Z955 Presence of coronary angioplasty implant and graft: Secondary | ICD-10-CM | POA: Diagnosis not present

## 2016-02-12 DIAGNOSIS — E785 Hyperlipidemia, unspecified: Secondary | ICD-10-CM | POA: Diagnosis not present

## 2016-02-12 DIAGNOSIS — I2510 Atherosclerotic heart disease of native coronary artery without angina pectoris: Secondary | ICD-10-CM | POA: Diagnosis not present

## 2016-02-12 DIAGNOSIS — E7800 Pure hypercholesterolemia, unspecified: Secondary | ICD-10-CM | POA: Diagnosis not present

## 2016-02-12 DIAGNOSIS — Z79899 Other long term (current) drug therapy: Secondary | ICD-10-CM | POA: Diagnosis not present

## 2016-05-06 IMAGING — MR MR 3D RECON AT SCANNER
19 of 22 series · 19 of 22 positions shown · IV contrast (multihance)
Comparison: Nuclear medicine hepatobiliary scan dated 02/18/2015.
CT abdomen pelvis dated 02/09/2015. ERCP dated 02/08/2015.

CLINICAL DATA: Status post laparoscopic cholecystectomy on
01/27/2015, bile leak via surgical wound, pain/tenderness

EXAM:
MRI ABDOMEN WITHOUT AND WITH CONTRAST (INCLUDING MRCP)
TECHNIQUE: Multiplanar multisequence MR imaging of the abdomen was performed
both before and after the administration of intravenous contrast.
Heavily T2-weighted images of the biliary and pancreatic ducts were
obtained, and three-dimensional MRCP images were rendered by post
processing.
CONTRAST:  12mL MULTIHANCE GADOBENATE DIMEGLUMINE 529 MG/ML IV SOLN

[Series 4: T2 · coronal · 5.0mm · 0.82mm/px · 1 of 31 slices shown]
[im 1/31]
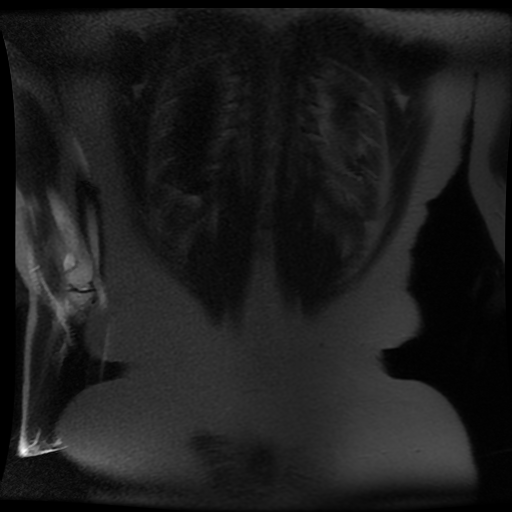

[Series 6: MRCP · coronal · 1.6mm · 0.62mm/px · 1 of 96 slices shown (1 of 3)]
[im 1/96]
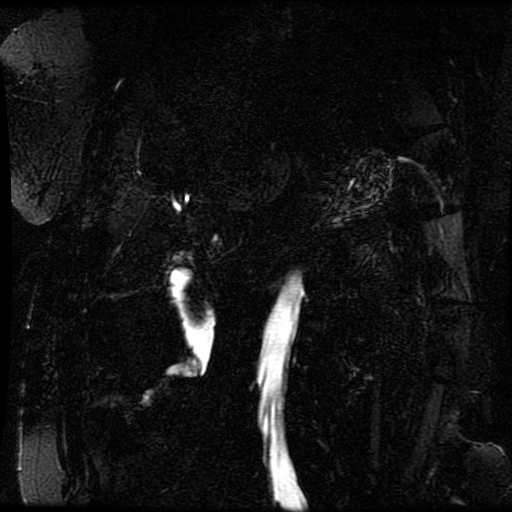

[Series 7: T2 fat-sat · axial · 5.0mm · 0.70mm/px · 1 of 39 slices shown]
[im 1/39]
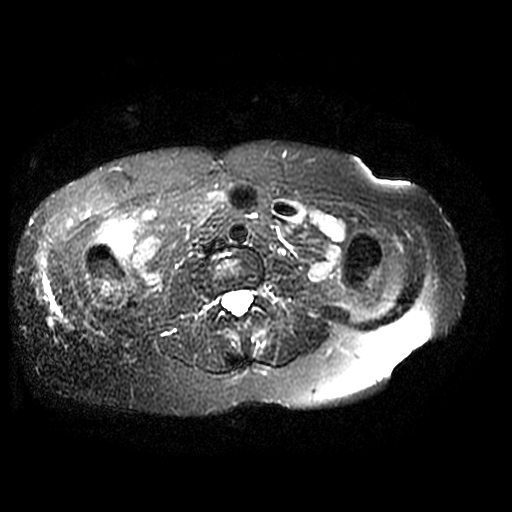

[Series 9: MRCP · coronal · 2.0mm · 0.70mm/px · 1 of 38 slices shown (2 of 3)]
[im 1/38]
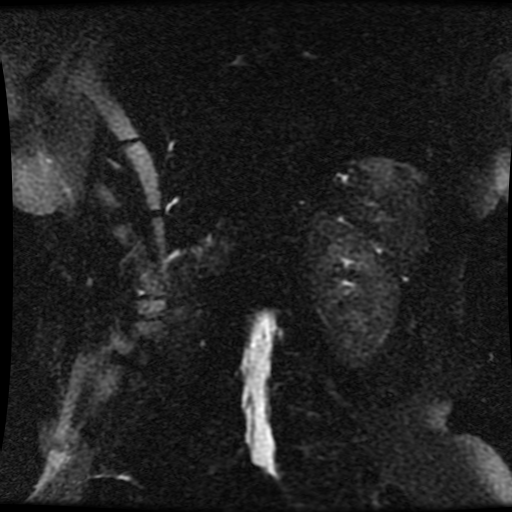

[Series 10: MRCP · sagittal · 40.0mm · 0.70mm/px · 1 of 12 slices shown (3 of 3)]
[im 1/12]
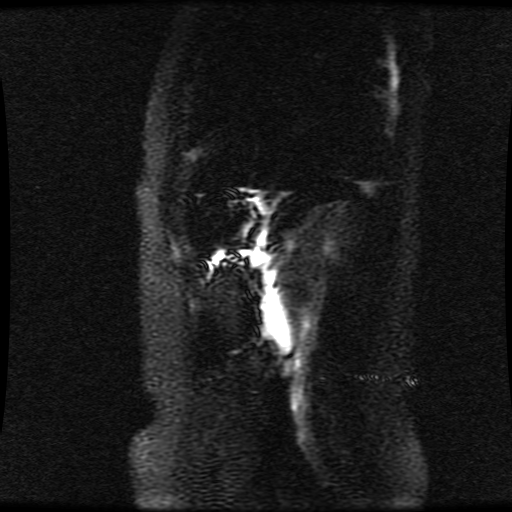

[Series 11: DWI b500 · axial · 6.0mm · 1.48mm/px · 1 of 68 slices shown]
[im 1/68]
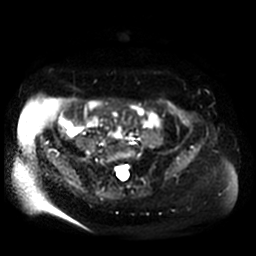

[Series 13: ax dualecho · axial · 5.0mm · 0.78mm/px · 1 of 100 slices shown]
[im 1/100]
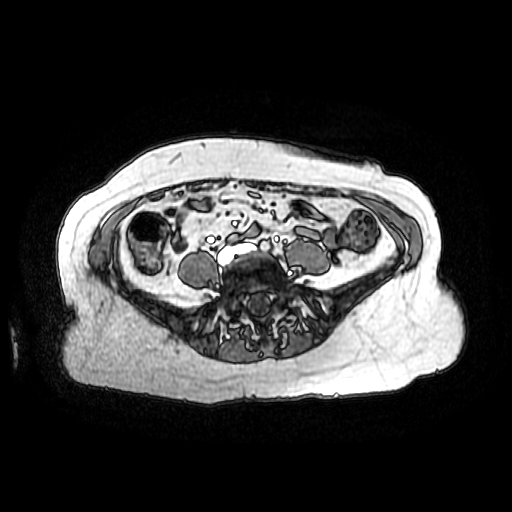

[Series 16: T1 dynamic post-contrast · coronal · 5.0mm · 0.78mm/px · 1 of 64 slices shown]
[im 1/64]
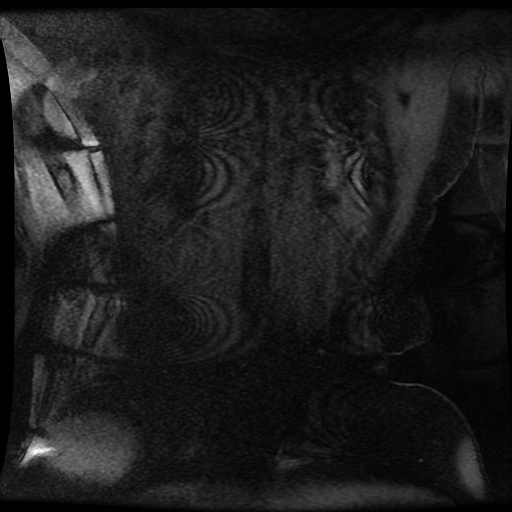

[Series 601: processed images · axial · 1.6mm · 0.62mm/px · 1 of 1 slices shown (1 of 2)]
[im 1/1]
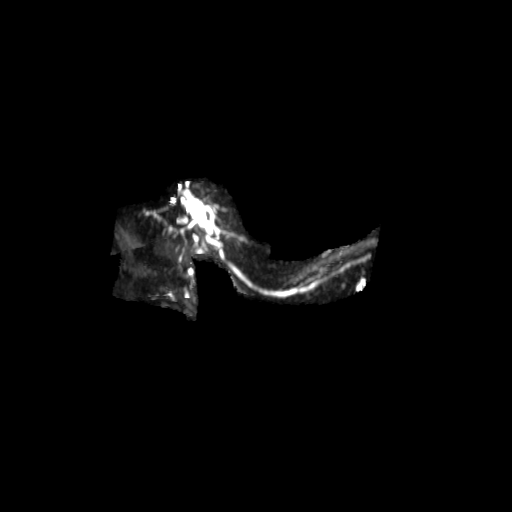

[Series 604: processed images · sagittal · 1.0mm · 0.62mm/px · 1 of 131 slices shown (2 of 2)]
[im 1/131]
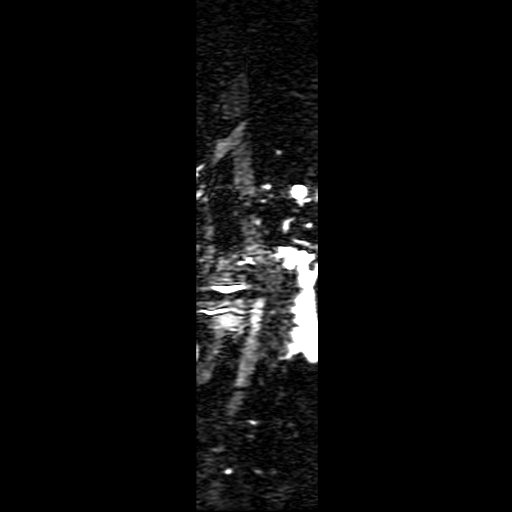

[Series 1100: DWI · axial · 6.0mm · 1.48mm/px · 1 of 34 slices shown]
[im 1/34]
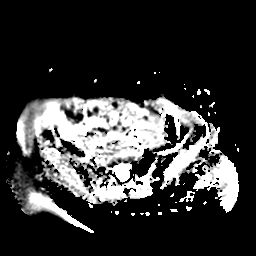

[Series 1500: T1 dynamic · axial · 5.0mm · 0.78mm/px · 1 of 88 slices shown (1 of 5)]
[im 1/88]
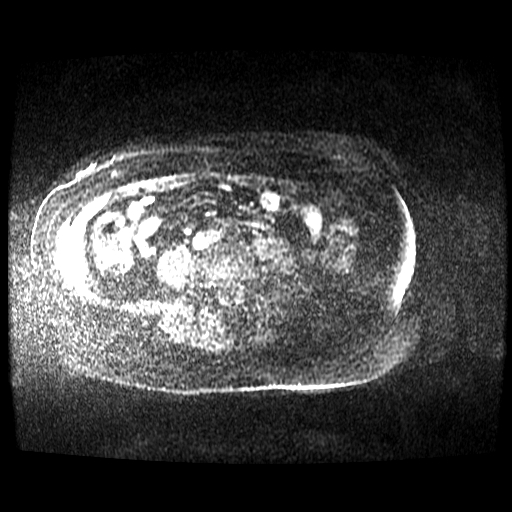

[Series 1501: T1 dynamic · axial · 5.0mm · 0.78mm/px · 1 of 88 slices shown (2 of 5)]
[im 1/88]
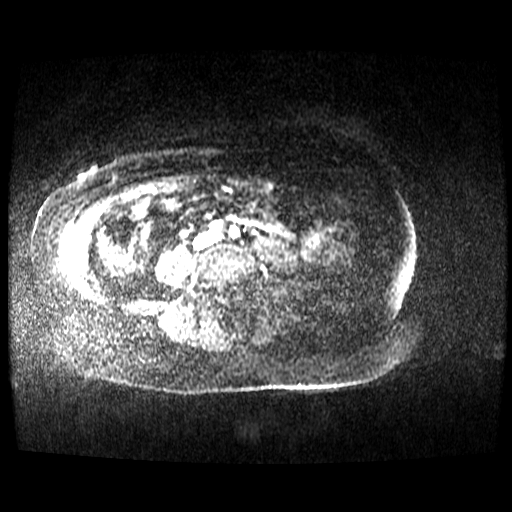

[Series 1502: T1 dynamic · axial · 5.0mm · 0.78mm/px · 1 of 88 slices shown (3 of 5)]
[im 1/88]
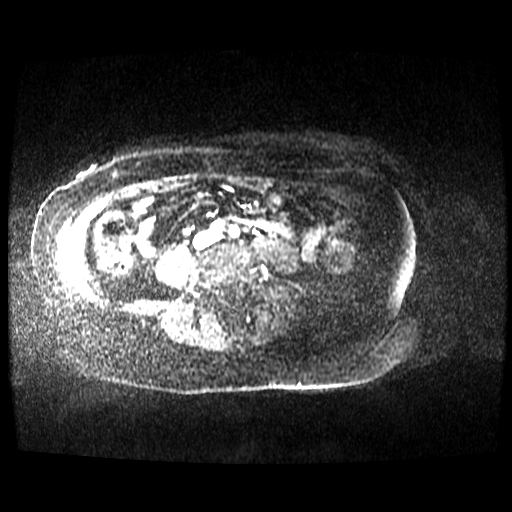

[Series 1503: T1 dynamic · axial · 5.0mm · 0.78mm/px · 1 of 88 slices shown (4 of 5)]
[im 1/88]
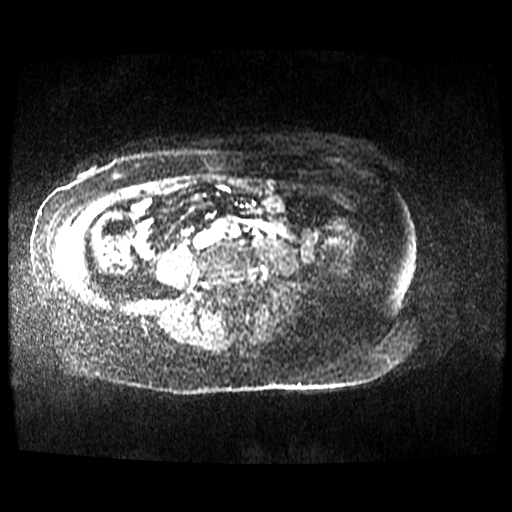

[Series 1504: T1 dynamic · axial · 5.0mm · 0.78mm/px · 1 of 88 slices shown (5 of 5)]
[im 1/88]
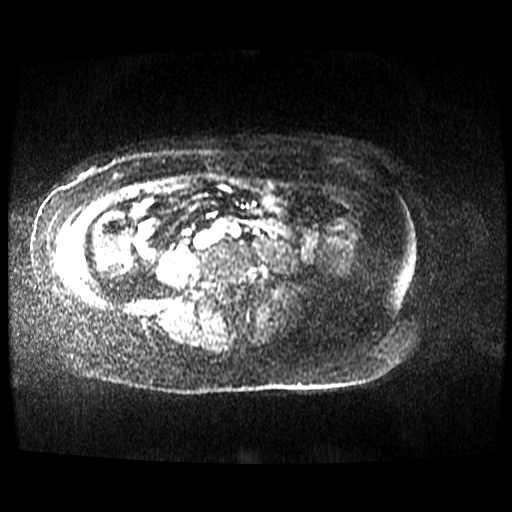

[((id)/(id)/1)-((id)/(id)/1) · axial · 5.0mm · 0.78mm/px · 1 of 88 slices shown (1 of 3)]
[im 1/88]
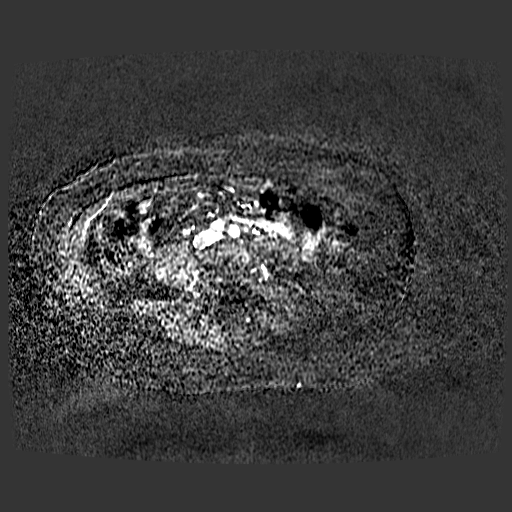

[((id)/(id)/1)-((id)/(id)/1) · axial · 5.0mm · 0.78mm/px · 1 of 88 slices shown (2 of 3)]
[im 1/88]
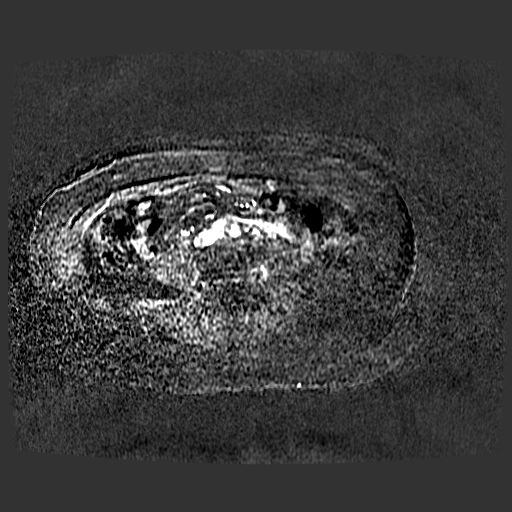

[((id)/(id)/1)-((id)/(id)/1) · axial · 5.0mm · 0.78mm/px · 1 of 88 slices shown (3 of 3)]
[im 1/88]
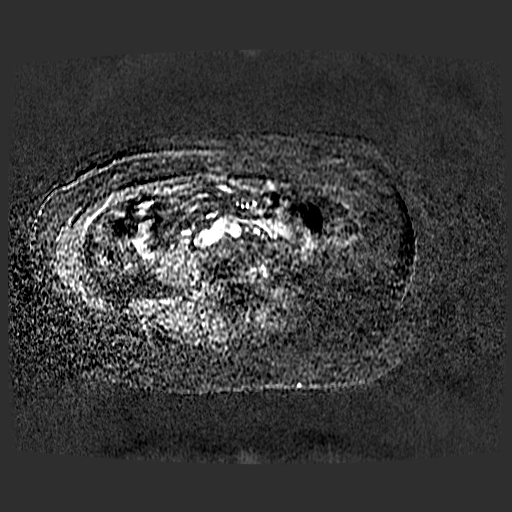

[19 of 22 positions shown; findings below may reference images not displayed]

FINDINGS: Lower chest: Mild patchy opacity at the left lung base (series
7/image 6).

Cardiomegaly.

Hepatobiliary: Liver is notable for mild hepatic steatosis. No
suspicious/enhancing hepatic lesions. Mild increased perfusion along
the gallbladder fossa.

Status post cholecystectomy. No intrahepatic or extrahepatic ductal
dilatation. Common duct measures 5 mm (series 6/image 24). No
choledocholithiasis is seen.

1.6 x 2.6 cm complex fluid collection with layering fluid-fluid
level in the gallbladder fossa (series 8/image 21), likely
reflecting a biloma. This collection is adjacent to susceptibility
artifact from a surgical clips (series 8/image 20), likely
corresponding to the known bile leak at the cystic duct
remnant/stump.

Direct drainage of the collection to the skin surface via a catheter
tract along the right anterior abdomen and exiting the right lateral
abdominal wall (series 8/images 24, 27, and 35).

Pancreas: Within normal limits.

Spleen: Within normal limits.

Adrenals/Urinary Tract: Adrenal glands are within normal limits.

Kidneys within normal limits.  No hydronephrosis.

Stomach/Bowel: Stomach is within normal limits.

Visualized bowel is unremarkable.

Vascular/Lymphatic: No evidence of abdominal aortic aneurysm.

No suspicious abdominal lymphadenopathy.

Other: No abdominal ascites.

Musculoskeletal: No focal osseous lesions.
IMPRESSION: Status post cholecystectomy. No intrahepatic or extrahepatic ductal
dilatation. Common duct measures 5 mm. No choledocholithiasis is
seen.

1.6 x 2.6 cm complex fluid collection/biloma in the gallbladder
fossa, likely corresponding to the known bile leak at the cystic
duct remnant/stump.

Drug drainage of the collection to the skin surface deviated a
catheter tract along the right anterior abdomen and exiting the
right lateral abdominal wall.

Additional ancillary findings as above.

## 2016-05-06 IMAGING — NM NM HEPATOBILIARY IMAGE, INC GB
2 series · 12 of 12 positions shown · non-contrast
Comparison: CT abdomen pelvis dated 02/17/2015

CLINICAL DATA: Status post cholecystectomy, evaluate for bile leak

EXAM:
NUCLEAR MEDICINE HEPATOBILIARY IMAGING
TECHNIQUE: Sequential images of the abdomen were obtained [DATE] minutes
following intravenous administration of radiopharmaceutical.
RADIOPHARMACEUTICALS:  5.3 mCi Cc-GGm  Choletec IV

[he hepatobiliary · 3.43mm/px · 6 of 20 frames shown (1 of 2)]
[frame 2/20]
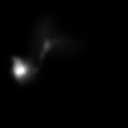
[frame 5/20]
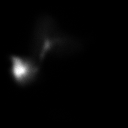
[frame 9/20]
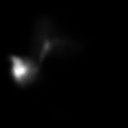
[frame 12/20]
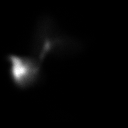
[frame 15/20]
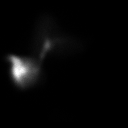
[frame 19/20]
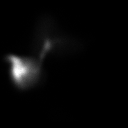

[he hepatobiliary · 3.43mm/px · 6 of 41 frames shown (2 of 2)]
[frame 4/41]
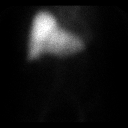
[frame 10/41]
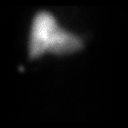
[frame 17/41]
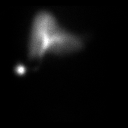
[frame 24/41]
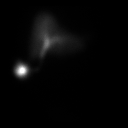
[frame 31/41]
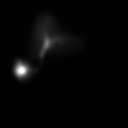
[frame 38/41]
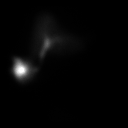

[12 of 12 positions shown; findings below may reference images not displayed]

FINDINGS: Normal hepatic excretion.

Within 15 minutes, radiotracer begins to pool along the inferior
liver margin. This is clearly extraluminal and reflects a bile leak.

Study was aborted after 45 minutes.
IMPRESSION: Study is positive for bile leak.

These results will be called to the ordering clinician or
representative by the Radiologist Assistant, and communication
documented in the PACS or zVision Dashboard.

## 2016-06-25 DIAGNOSIS — M328 Other forms of systemic lupus erythematosus: Secondary | ICD-10-CM | POA: Diagnosis not present

## 2016-06-25 DIAGNOSIS — E782 Mixed hyperlipidemia: Secondary | ICD-10-CM | POA: Diagnosis not present

## 2016-06-25 DIAGNOSIS — I10 Essential (primary) hypertension: Secondary | ICD-10-CM | POA: Diagnosis not present

## 2016-06-25 DIAGNOSIS — M130 Polyarthritis, unspecified: Secondary | ICD-10-CM | POA: Diagnosis not present

## 2016-06-25 DIAGNOSIS — I519 Heart disease, unspecified: Secondary | ICD-10-CM | POA: Diagnosis not present

## 2016-06-25 DIAGNOSIS — I514 Myocarditis, unspecified: Secondary | ICD-10-CM | POA: Diagnosis not present

## 2017-02-14 DIAGNOSIS — Z955 Presence of coronary angioplasty implant and graft: Secondary | ICD-10-CM | POA: Diagnosis not present

## 2017-02-14 DIAGNOSIS — K219 Gastro-esophageal reflux disease without esophagitis: Secondary | ICD-10-CM | POA: Diagnosis not present

## 2017-02-14 DIAGNOSIS — E7800 Pure hypercholesterolemia, unspecified: Secondary | ICD-10-CM | POA: Diagnosis not present

## 2017-02-14 DIAGNOSIS — I2510 Atherosclerotic heart disease of native coronary artery without angina pectoris: Secondary | ICD-10-CM | POA: Diagnosis not present

## 2017-05-19 DIAGNOSIS — Z955 Presence of coronary angioplasty implant and graft: Secondary | ICD-10-CM | POA: Diagnosis not present

## 2017-05-19 DIAGNOSIS — I2510 Atherosclerotic heart disease of native coronary artery without angina pectoris: Secondary | ICD-10-CM | POA: Diagnosis not present

## 2017-05-19 DIAGNOSIS — E7800 Pure hypercholesterolemia, unspecified: Secondary | ICD-10-CM | POA: Diagnosis not present

## 2017-05-19 DIAGNOSIS — K219 Gastro-esophageal reflux disease without esophagitis: Secondary | ICD-10-CM | POA: Diagnosis not present

## 2017-08-18 DIAGNOSIS — M3219 Other organ or system involvement in systemic lupus erythematosus: Secondary | ICD-10-CM | POA: Diagnosis not present

## 2017-08-18 DIAGNOSIS — I10 Essential (primary) hypertension: Secondary | ICD-10-CM | POA: Diagnosis not present

## 2017-08-18 DIAGNOSIS — I2510 Atherosclerotic heart disease of native coronary artery without angina pectoris: Secondary | ICD-10-CM | POA: Diagnosis not present

## 2017-08-18 DIAGNOSIS — K219 Gastro-esophageal reflux disease without esophagitis: Secondary | ICD-10-CM | POA: Diagnosis not present

## 2017-09-04 DIAGNOSIS — E785 Hyperlipidemia, unspecified: Secondary | ICD-10-CM | POA: Diagnosis not present

## 2017-09-04 DIAGNOSIS — I2510 Atherosclerotic heart disease of native coronary artery without angina pectoris: Secondary | ICD-10-CM | POA: Diagnosis not present

## 2017-09-04 DIAGNOSIS — R079 Chest pain, unspecified: Secondary | ICD-10-CM | POA: Diagnosis not present

## 2017-09-10 DIAGNOSIS — R079 Chest pain, unspecified: Secondary | ICD-10-CM | POA: Diagnosis not present

## 2017-10-14 DIAGNOSIS — I6522 Occlusion and stenosis of left carotid artery: Secondary | ICD-10-CM | POA: Diagnosis not present

## 2017-10-29 DIAGNOSIS — K5909 Other constipation: Secondary | ICD-10-CM | POA: Diagnosis not present

## 2017-10-29 DIAGNOSIS — R140 Abdominal distension (gaseous): Secondary | ICD-10-CM | POA: Diagnosis not present

## 2017-10-29 DIAGNOSIS — K219 Gastro-esophageal reflux disease without esophagitis: Secondary | ICD-10-CM | POA: Diagnosis not present

## 2018-01-07 DIAGNOSIS — E7800 Pure hypercholesterolemia, unspecified: Secondary | ICD-10-CM | POA: Diagnosis not present

## 2018-01-07 DIAGNOSIS — R9430 Abnormal result of cardiovascular function study, unspecified: Secondary | ICD-10-CM | POA: Diagnosis not present

## 2018-01-07 DIAGNOSIS — I2510 Atherosclerotic heart disease of native coronary artery without angina pectoris: Secondary | ICD-10-CM | POA: Diagnosis not present

## 2018-01-07 DIAGNOSIS — R079 Chest pain, unspecified: Secondary | ICD-10-CM | POA: Diagnosis not present

## 2018-01-09 DIAGNOSIS — Z720 Tobacco use: Secondary | ICD-10-CM | POA: Diagnosis not present

## 2018-01-09 DIAGNOSIS — K219 Gastro-esophageal reflux disease without esophagitis: Secondary | ICD-10-CM | POA: Diagnosis not present

## 2018-01-09 DIAGNOSIS — I2510 Atherosclerotic heart disease of native coronary artery without angina pectoris: Secondary | ICD-10-CM | POA: Diagnosis not present

## 2018-01-09 DIAGNOSIS — E785 Hyperlipidemia, unspecified: Secondary | ICD-10-CM | POA: Diagnosis not present

## 2018-01-09 DIAGNOSIS — I25119 Atherosclerotic heart disease of native coronary artery with unspecified angina pectoris: Secondary | ICD-10-CM | POA: Diagnosis not present

## 2018-01-09 DIAGNOSIS — I10 Essential (primary) hypertension: Secondary | ICD-10-CM | POA: Diagnosis not present

## 2018-01-10 DIAGNOSIS — K219 Gastro-esophageal reflux disease without esophagitis: Secondary | ICD-10-CM | POA: Diagnosis not present

## 2018-01-10 DIAGNOSIS — R079 Chest pain, unspecified: Secondary | ICD-10-CM | POA: Diagnosis not present

## 2018-01-10 DIAGNOSIS — I25119 Atherosclerotic heart disease of native coronary artery with unspecified angina pectoris: Secondary | ICD-10-CM | POA: Diagnosis not present

## 2018-01-11 DIAGNOSIS — K219 Gastro-esophageal reflux disease without esophagitis: Secondary | ICD-10-CM | POA: Diagnosis not present

## 2018-01-11 DIAGNOSIS — I2510 Atherosclerotic heart disease of native coronary artery without angina pectoris: Secondary | ICD-10-CM | POA: Diagnosis not present

## 2018-01-12 DIAGNOSIS — J9811 Atelectasis: Secondary | ICD-10-CM | POA: Diagnosis not present

## 2018-01-12 DIAGNOSIS — Z885 Allergy status to narcotic agent status: Secondary | ICD-10-CM | POA: Diagnosis not present

## 2018-01-12 DIAGNOSIS — R0789 Other chest pain: Secondary | ICD-10-CM | POA: Diagnosis not present

## 2018-01-12 DIAGNOSIS — G8918 Other acute postprocedural pain: Secondary | ICD-10-CM | POA: Diagnosis not present

## 2018-01-12 DIAGNOSIS — Z7982 Long term (current) use of aspirin: Secondary | ICD-10-CM | POA: Diagnosis not present

## 2018-01-12 DIAGNOSIS — D649 Anemia, unspecified: Secondary | ICD-10-CM | POA: Diagnosis not present

## 2018-01-12 DIAGNOSIS — R109 Unspecified abdominal pain: Secondary | ICD-10-CM | POA: Diagnosis not present

## 2018-01-12 DIAGNOSIS — Z9889 Other specified postprocedural states: Secondary | ICD-10-CM | POA: Diagnosis not present

## 2018-01-12 DIAGNOSIS — I2510 Atherosclerotic heart disease of native coronary artery without angina pectoris: Secondary | ICD-10-CM | POA: Diagnosis not present

## 2018-01-12 DIAGNOSIS — I10 Essential (primary) hypertension: Secondary | ICD-10-CM | POA: Diagnosis not present

## 2018-01-12 DIAGNOSIS — Z91041 Radiographic dye allergy status: Secondary | ICD-10-CM | POA: Diagnosis not present

## 2018-01-12 DIAGNOSIS — R9439 Abnormal result of other cardiovascular function study: Secondary | ICD-10-CM | POA: Diagnosis not present

## 2018-01-12 DIAGNOSIS — K219 Gastro-esophageal reflux disease without esophagitis: Secondary | ICD-10-CM | POA: Diagnosis not present

## 2018-01-12 DIAGNOSIS — Z87891 Personal history of nicotine dependence: Secondary | ICD-10-CM | POA: Diagnosis not present

## 2018-01-12 DIAGNOSIS — R918 Other nonspecific abnormal finding of lung field: Secondary | ICD-10-CM | POA: Diagnosis not present

## 2018-01-12 DIAGNOSIS — Z48812 Encounter for surgical aftercare following surgery on the circulatory system: Secondary | ICD-10-CM | POA: Diagnosis not present

## 2018-01-12 DIAGNOSIS — J939 Pneumothorax, unspecified: Secondary | ICD-10-CM | POA: Diagnosis not present

## 2018-01-12 DIAGNOSIS — J181 Lobar pneumonia, unspecified organism: Secondary | ICD-10-CM | POA: Diagnosis not present

## 2018-01-12 DIAGNOSIS — Z955 Presence of coronary angioplasty implant and graft: Secondary | ICD-10-CM | POA: Diagnosis not present

## 2018-01-12 DIAGNOSIS — I25119 Atherosclerotic heart disease of native coronary artery with unspecified angina pectoris: Secondary | ICD-10-CM | POA: Diagnosis not present

## 2018-01-12 DIAGNOSIS — Z951 Presence of aortocoronary bypass graft: Secondary | ICD-10-CM | POA: Diagnosis not present

## 2018-01-12 DIAGNOSIS — Z8249 Family history of ischemic heart disease and other diseases of the circulatory system: Secondary | ICD-10-CM | POA: Diagnosis not present

## 2018-01-12 DIAGNOSIS — I959 Hypotension, unspecified: Secondary | ICD-10-CM | POA: Diagnosis not present

## 2018-01-12 DIAGNOSIS — I7300 Raynaud's syndrome without gangrene: Secondary | ICD-10-CM | POA: Diagnosis not present

## 2018-01-12 DIAGNOSIS — E785 Hyperlipidemia, unspecified: Secondary | ICD-10-CM | POA: Diagnosis not present

## 2018-01-12 DIAGNOSIS — R079 Chest pain, unspecified: Secondary | ICD-10-CM | POA: Diagnosis not present

## 2018-01-12 DIAGNOSIS — J95811 Postprocedural pneumothorax: Secondary | ICD-10-CM | POA: Diagnosis not present

## 2018-01-12 DIAGNOSIS — J90 Pleural effusion, not elsewhere classified: Secondary | ICD-10-CM | POA: Diagnosis not present

## 2018-01-13 DIAGNOSIS — I2510 Atherosclerotic heart disease of native coronary artery without angina pectoris: Secondary | ICD-10-CM | POA: Diagnosis not present

## 2018-01-20 DIAGNOSIS — Z48812 Encounter for surgical aftercare following surgery on the circulatory system: Secondary | ICD-10-CM | POA: Diagnosis not present

## 2018-01-20 DIAGNOSIS — I2510 Atherosclerotic heart disease of native coronary artery without angina pectoris: Secondary | ICD-10-CM | POA: Diagnosis not present

## 2018-01-20 DIAGNOSIS — Z955 Presence of coronary angioplasty implant and graft: Secondary | ICD-10-CM | POA: Diagnosis not present

## 2018-01-20 DIAGNOSIS — K219 Gastro-esophageal reflux disease without esophagitis: Secondary | ICD-10-CM | POA: Diagnosis not present

## 2018-01-20 DIAGNOSIS — Z951 Presence of aortocoronary bypass graft: Secondary | ICD-10-CM | POA: Diagnosis not present

## 2018-01-20 DIAGNOSIS — I10 Essential (primary) hypertension: Secondary | ICD-10-CM | POA: Diagnosis not present

## 2018-01-20 DIAGNOSIS — I7300 Raynaud's syndrome without gangrene: Secondary | ICD-10-CM | POA: Diagnosis not present

## 2018-01-20 DIAGNOSIS — Z9181 History of falling: Secondary | ICD-10-CM | POA: Diagnosis not present

## 2018-01-20 DIAGNOSIS — E785 Hyperlipidemia, unspecified: Secondary | ICD-10-CM | POA: Diagnosis not present

## 2018-01-20 DIAGNOSIS — Z87891 Personal history of nicotine dependence: Secondary | ICD-10-CM | POA: Diagnosis not present

## 2018-01-23 DIAGNOSIS — Z955 Presence of coronary angioplasty implant and graft: Secondary | ICD-10-CM | POA: Diagnosis not present

## 2018-01-23 DIAGNOSIS — I10 Essential (primary) hypertension: Secondary | ICD-10-CM | POA: Diagnosis not present

## 2018-01-23 DIAGNOSIS — Z87891 Personal history of nicotine dependence: Secondary | ICD-10-CM | POA: Diagnosis not present

## 2018-01-23 DIAGNOSIS — I7300 Raynaud's syndrome without gangrene: Secondary | ICD-10-CM | POA: Diagnosis not present

## 2018-01-23 DIAGNOSIS — Z951 Presence of aortocoronary bypass graft: Secondary | ICD-10-CM | POA: Diagnosis not present

## 2018-01-23 DIAGNOSIS — I2510 Atherosclerotic heart disease of native coronary artery without angina pectoris: Secondary | ICD-10-CM | POA: Diagnosis not present

## 2018-01-23 DIAGNOSIS — Z9181 History of falling: Secondary | ICD-10-CM | POA: Diagnosis not present

## 2018-01-23 DIAGNOSIS — K219 Gastro-esophageal reflux disease without esophagitis: Secondary | ICD-10-CM | POA: Diagnosis not present

## 2018-01-23 DIAGNOSIS — E785 Hyperlipidemia, unspecified: Secondary | ICD-10-CM | POA: Diagnosis not present

## 2018-01-23 DIAGNOSIS — Z48812 Encounter for surgical aftercare following surgery on the circulatory system: Secondary | ICD-10-CM | POA: Diagnosis not present

## 2018-02-16 DIAGNOSIS — R079 Chest pain, unspecified: Secondary | ICD-10-CM | POA: Diagnosis not present

## 2018-02-16 DIAGNOSIS — R9430 Abnormal result of cardiovascular function study, unspecified: Secondary | ICD-10-CM | POA: Diagnosis not present

## 2018-02-16 DIAGNOSIS — E7800 Pure hypercholesterolemia, unspecified: Secondary | ICD-10-CM | POA: Diagnosis not present

## 2018-02-16 DIAGNOSIS — I2510 Atherosclerotic heart disease of native coronary artery without angina pectoris: Secondary | ICD-10-CM | POA: Diagnosis not present

## 2018-03-26 ENCOUNTER — Other Ambulatory Visit: Payer: Self-pay

## 2018-03-26 NOTE — Patient Outreach (Signed)
[facility] Care Management  03/26/2018  Sheila [name] [birth year] [id number]   Medication Adherence call to Mrs. Sheila Plentynetta Valade left a message for patient to call back patient is due on Atorvastatin 40 mg. Mrs. [name] is showing past due under [facility] [employer].   Lillia AbedAna Ollison-Moran CPhT Pharmacy Technician [facility] Care Management Direct Dial [phone]  Fax [phone] [email or web address]

## 2018-04-06 DIAGNOSIS — I2510 Atherosclerotic heart disease of native coronary artery without angina pectoris: Secondary | ICD-10-CM | POA: Diagnosis not present

## 2018-04-06 DIAGNOSIS — R079 Chest pain, unspecified: Secondary | ICD-10-CM | POA: Diagnosis not present

## 2018-04-06 DIAGNOSIS — E7800 Pure hypercholesterolemia, unspecified: Secondary | ICD-10-CM | POA: Diagnosis not present

## 2018-04-06 DIAGNOSIS — R9430 Abnormal result of cardiovascular function study, unspecified: Secondary | ICD-10-CM | POA: Diagnosis not present

## 2018-06-26 DIAGNOSIS — E785 Hyperlipidemia, unspecified: Secondary | ICD-10-CM | POA: Diagnosis not present

## 2018-06-26 DIAGNOSIS — Z79899 Other long term (current) drug therapy: Secondary | ICD-10-CM | POA: Diagnosis not present

## 2018-06-26 DIAGNOSIS — I2510 Atherosclerotic heart disease of native coronary artery without angina pectoris: Secondary | ICD-10-CM | POA: Diagnosis not present

## 2018-06-26 DIAGNOSIS — R079 Chest pain, unspecified: Secondary | ICD-10-CM | POA: Diagnosis not present

## 2018-06-26 DIAGNOSIS — I10 Essential (primary) hypertension: Secondary | ICD-10-CM | POA: Diagnosis not present

## 2018-06-26 DIAGNOSIS — R9430 Abnormal result of cardiovascular function study, unspecified: Secondary | ICD-10-CM | POA: Diagnosis not present
